# Patient Record
Sex: Female | Born: 1945
Health system: Southern US, Community
[De-identification: ages and names within clinical notes are randomized; demographics above are authoritative.]

## PROBLEM LIST (undated history)

## (undated) DIAGNOSIS — Z8619 Personal history of other infectious and parasitic diseases: Secondary | ICD-10-CM

## (undated) DIAGNOSIS — L309 Dermatitis, unspecified: Secondary | ICD-10-CM

## (undated) DIAGNOSIS — I10 Essential (primary) hypertension: Secondary | ICD-10-CM

## (undated) HISTORY — PX: UMBILICAL HERNIA REPAIR: SHX196

## (undated) HISTORY — DX: Essential (primary) hypertension: I10

## (undated) HISTORY — PX: BUNIONECTOMY: SHX129

---

## 1979-03-01 HISTORY — PX: UMBILICAL HERNIA REPAIR: SHX196

## 2004-09-21 ENCOUNTER — Encounter (INDEPENDENT_AMBULATORY_CARE_PROVIDER_SITE_OTHER): Payer: Self-pay | Admitting: Cardiology

## 2004-09-21 ENCOUNTER — Inpatient Hospital Stay (HOSPITAL_COMMUNITY): Admission: EM | Admit: 2004-09-21 | Discharge: 2004-09-22 | Payer: Self-pay | Admitting: Emergency Medicine

## 2008-03-03 ENCOUNTER — Ambulatory Visit: Payer: Self-pay | Admitting: Psychology

## 2008-03-03 ENCOUNTER — Encounter: Admission: RE | Admit: 2008-03-03 | Discharge: 2008-03-10 | Payer: Self-pay | Admitting: Psychology

## 2009-08-06 ENCOUNTER — Ambulatory Visit: Payer: Self-pay | Admitting: Diagnostic Radiology

## 2009-08-06 ENCOUNTER — Emergency Department (HOSPITAL_BASED_OUTPATIENT_CLINIC_OR_DEPARTMENT_OTHER): Admission: EM | Admit: 2009-08-06 | Discharge: 2009-08-06 | Payer: Self-pay | Admitting: Emergency Medicine

## 2010-05-17 LAB — DIFFERENTIAL
Basophils Absolute: 0 10*3/uL (ref 0.0–0.1)
Basophils Relative: 1 % (ref 0–1)
Eosinophils Absolute: 0.1 10*3/uL (ref 0.0–0.7)
Lymphs Abs: 2.4 10*3/uL (ref 0.7–4.0)
Monocytes Absolute: 0.6 10*3/uL (ref 0.1–1.0)
Monocytes Relative: 9 % (ref 3–12)
Neutro Abs: 3 10*3/uL (ref 1.7–7.7)
Neutrophils Relative %: 49 % (ref 43–77)

## 2010-05-17 LAB — POCT CARDIAC MARKERS
CKMB, poc: <1 ng/mL — ABNORMAL LOW (ref 1.0–8.0)
Troponin i, poc: <0.05 ng/mL (ref 0.00–0.09)

## 2010-05-17 LAB — CBC
MCHC: 34 g/dL (ref 30.0–36.0)
Platelets: 173 10*3/uL (ref 150–400)

## 2010-05-17 LAB — COMPREHENSIVE METABOLIC PANEL
AST: 53 U/L — ABNORMAL HIGH (ref 0–37)
Alkaline Phosphatase: 99 U/L (ref 39–117)
BUN: 17 mg/dL (ref 6–23)
CO2: 29 mEq/L (ref 19–32)
Creatinine, Ser: 0.9 mg/dL (ref 0.4–1.2)
GFR calc Af Amer: >60 mL/min (ref >60)
Glucose, Bld: 140 mg/dL — ABNORMAL HIGH (ref 70–99)
Total Bilirubin: 1.6 mg/dL — ABNORMAL HIGH (ref 0.3–1.2)

## 2010-07-16 NOTE — Discharge Summary (Signed)
Virginia Delgado, Virginia Delgado NO.:  [id number]   MEDICAL RECORD NO.:  [id number]          PATIENT TYPE:  INP   LOCATION:  4704                         FACILITY:  [facility]   PHYSICIAN:  Michaelyn Barter, M.D. DATE OF BIRTH:  05/25/1945   DATE OF ADMISSION:  09/21/2004  DATE OF DISCHARGE:  09/22/2004                                 DISCHARGE SUMMARY   PRIMARY CARE PHYSICIAN:  Unassigned.   HISTORY OF PRESENT ILLNESS:  Ms. Spease is a 65 year old female with a past  medical history of hypertension who arrived in the emergency room with the  chief complaint of chest pain.  She stated that the pain had started  intermittently over the past few months.  It was usually transient,  occurring at rest primarily, but occasionally with activity.  It was right-  sided in location and occasionally accompanied by nausea.  There was no  radiation, no diaphoresis, no shortness of breath or palpitations.  The  previous night, the chest pain occurred and was rated at a 10/10.   PAST MEDICAL HISTORY:  1.  Hypertension.  2.  Cardiolite stress test in December 2004 reported results negative.  3.  Hepatitis C diagnosed in 1995.  4.  History of placenta previa.   PAST SURGICAL HISTORY:  1.  Cesarean section x2.  2.  Repair of umbilical hernia.   SOCIAL HISTORY:  Alcohol, the patient denies.  Cigarettes, the patient  denies.   FAMILY HISTORY:  Father died of a heart attack at age 59.  Mother is living.  She has breast cancer.  Sister has history of breast cancer.   HOSPITAL COURSE:  Problem 1.  Chest pain.  The patient was admitted for  further evaluation of her chest pain.  While in the ER, she underwent a  chest x-ray which was interpreted as no acute cardiopulmonary process.  Over  the course of her hospitalization, she had cardiac enzymes completed, the  results of which for the troponin I were 0.01 x3.  Likewise, the CK MB's  were also within normal limits.  In addition, the patient  had a homocysteine  completed which was completely normal at 8.0; however, she had a D-dimer  completed and it was only marginally elevated at 0.49.  The patient did not  demonstrate any overt symptoms of a pulmonary embolus; however, venous  Dopplers of the lower extremities were completed to rule out the presence of  a DVT.  The final impression of which were no evidence of DVT, superficial  thrombosis or Baker's cyst noted bilaterally.  Following her admission into  the hospital, the patient did not complain of any chest pain.  Later in the  evening, on the date of her admission, she was noted to have told the nurse  that she was going home the following morning.  Today, the date of  discharge, the patient's condition is improved.   Problem 2.  Hypertension.  The patient's blood pressure has been controlled  over the course of her hospitalization.  She was restarted on two of her  prior home  medications, Norvasc 5 mg daily and Toprol XL 25 mg p.o. b.i.d.  The patient's blood pressure was controlled with both of these medications  and therefore her hydrochlorothiazide was not implemented over the course of  this hospitalization.   Problem 3.  Hypokalemia.  When the patient initially arrived in the  hospital, her potassium was noted to have been 3.3.  Therefore, the patient  was provided with potassium supplement and because her blood pressure again  was controlled, her hydrochlorothiazide was withheld.   CONDITION ON DISCHARGE:  Improved.  Today, the patient states that she does  not have any chest pain and she states she has not had any shortness of  breath.  She is very excited to be going home.  Her vitals today:  Temperature is 98.2, heart rate 66, respirations 20, blood pressure 112/64.  O2 saturations 97% on room air.   The patient did have an EKG completed this morning and it shows normal sinus  rhythm.  There are no obvious ST wave abnormalities.  Therefore, the  decision  has been made to discharge the patient home.   The patient will be discharged home on the following medications:  1.  Norvasc 5 mg p.o. daily.  2.  Toprol XL 25 mg p.o. b.i.d.   She is instructed to see her doctor before restarting her  hydrochlorothiazide given the fact that her blood pressure is currently well  controlled without it.  In addition, she is instructed to find a primary  care physician within the next three to four weeks for a routine follow-up  examination.       OR/MEDQ  D:  09/22/2004  T:  09/22/2004  Job:  [id number]

## 2010-07-16 NOTE — H&P (Signed)
NAMEYULANDA, Virginia Delgado NO.:  [id number]   MEDICAL RECORD NO.:  [id number]          PATIENT TYPE:  INP   LOCATION:                               FACILITY:  [facility]   PHYSICIAN:  Elliot Cousin, M.D.    DATE OF BIRTH:  1945-07-27   DATE OF ADMISSION:  09/21/2004  DATE OF DISCHARGE:                                HISTORY & PHYSICAL   PRIMARY CARE PHYSICIAN:  The patient is unassigned.   CHIEF COMPLAINT:  Chest pain.   HISTORY OF PRESENT ILLNESS:  The patient is a 65 year old lady with a past  medical history significant for hypertension, a negative cardiac stress test  in December of 2004, and hepatitis C, who presents to the emergency  department this morning with the chief complaint of chest pain. The patient  states that she has been having chest pain intermittently over the past few  months. The chest pain is usually transient and it occurs at rest mostly and  sometimes with activity. The pain is located primarily on the right side of  the chest. It is sometimes associated with nausea. No associated radiation,  diaphoresis, shortness of breath, pleurisy, or palpitations. The chest pain  generally is rated a 5/10 in intensity and generally lasts anywhere from 1  to 5 minutes. However, over the past 24 hours, the intensity of the pain  increased. Specifically, last night at approximately 6:00 to 8:00 p.m., the  chest pain was rated as a 10/10. The patient drove herself to [facility] (the patient recently moved from [place] and was unaware that  it was a OB-GYN hospital primarily). When she arrived to [facility],  she was given two sublingual nitroglycerin which took her pain from a 10/10  to 3/10 and subsequently to a 0/10. The patient was transferred to [facility] ER for further evaluation. The patient currently has no chest  pain. It is important to note that the patient had a negative Cardiolite  stress test in December of 2004 in  [place], [place]. Her primary care  physician at the time was Dr. Larina Bras. The stress test was performed primarily  as a risk stratification. At the time, the patient had no chest pain. The  patient does have a positive family history of heart disease; her father  died of a massive heart attack at 63 years of age. The patient denies any  recent stress or anxiety. She denies any recent lifting or manual labor. She  denies any upper respiratory infection symptoms. She has had no recent  cough.   When the patient was evaluated in the emergency department, her EKG revealed  sinus bradycardia with a heart rate of 59 beats per minute. She was  hemodynamically stable. Her cardiac markers x3 were completely negative. Her  chest x-ray revealed no acute disease. However, given the patient's risk  factors, she will be admitted for further evaluation and management.   PAST MEDICAL HISTORY:  1.  Hypertension.  2.  Cardiolite stress test in December of 2004, the results were reported as  negative.  3.  Hepatitis C diagnosed in 1995, etiology was felt to be a blood      transfusion in 1979 during one of her pregnancies.  4.  Status post Cesarean section x2.  5.  History of placenta previa with one pregnancy in 1979. During the      pregnancy, the patient received multiple blood transfusions.  6.  Status post umbilical hernia repair in the past.   MEDICATIONS:  1.  Norvasc 5 mg daily.  2.  Toprol-XL 25 mg b.i.d.  3.  Hydrochlorothiazide 25 mg daily.   ALLERGIES:  No known drug allergies.   SOCIAL HISTORY:  The patient recently moved from [place], [place], three  months ago. She has not found a primary care physician yet. She [place]  [place] She is married. She has two children.  She is employed in Presenter, broadcasting at [employer]. She does not smoke.  She drinks alcohol only occasionally. She denies illicit drug use.   FAMILY HISTORY:  Her father  died of a heart attack at 63 years of age. Her  mother is living. She has breast cancer and is 65 years of age. She also has  a sister who was diagnosed with breast cancer at 63 years of age.   REVIEW OF SYSTEMS:  The patient's review of systems is positive for  occasional ringing in her ears. Otherwise, her review of systems is  negative.   PHYSICAL EXAMINATION:  VITAL SIGNS:  Temperature 98.1, blood pressure  126/83, pulse 60, respiratory rate 16, oxygen saturation 100% on room air.  GENERAL:  The patient is a pleasant, mildly overweight, 65 year old African-  American woman who is currently sitting up in bed in no acute distress.  HEENT:  Head is normocephalic, nontraumatic. Pupils are equal, round, and  reactive to light. Extraocular movements are intact. Conjunctivae are clear.  Sclerae are white. Tympanic membranes are clear bilaterally. Nasal mucosa is  moist. No sinus drainage. Oropharynx:  Mucous membranes are moist; no  posterior exudates or erythema. Teeth are in good repair.  NECK:  Supple. No adenopathy, no thyromegaly, no bruit, no JVD.  LUNGS:  Clear to auscultation bilaterally.  HEART:  S1 and S2 with a soft systolic murmur and mild bradycardia.  ABDOMEN:  Mildly obese, positive bowel sounds, soft, nontender,  nondistended. No hepatosplenomegaly. The patient does have a well healed  suprapubic abdominal scar.  EXTREMITIES:  Pedal pulses 2+ bilaterally. No pretibial edema. No pedal  edema. The patient has good range of motion of all of her extremities. No  acute joint abnormalities.  NEUROLOGICAL:  The patient is alert and oriented x3. Cranial nerves II-XII  are intact. Strength is 5/5 throughout. Sensation is intact.   ADMISSION LABORATORY DATA:  EKG reveals sinus bradycardia with a heart rate  of 59 beats per minute. No other abnormalities. Chest x-ray reveals no acute  cardiopulmonary process. Cardiac markers negative x3 including myoglobin 51.9, CK-MB less than 1.0,  troponin I less than 0.05. WBC 7.3, hemoglobin  12.6, hematocrit 37.3, MCV 88.5, platelets 217. PTT 28, D-dimer 0.049 (0 to  0.48 within normal limits). PT 12.6, INR 0.9. Sodium 141, potassium 3.3,  chloride 110, CO2 24, glucose 106, BUN 10, creatinine 0.8, calcium 9.0.  Total protein 6.8, albumin 3.6, AST 43, ALT 53. Magnesium 2.2. Urinalysis:  Urine leukocyte esterase small, otherwise urinalysis is negative.   ASSESSMENT:  1.  Chest pain. The patient certainly has risk factors for coronary artery  disease including family history and hypertension. The patient states      that her cholesterol has been normal in the past. The chest pain appears      to be atypical given its right sided nature. Will also consider      gastroesophageal reflux disease, chest wall pain, less likely pulmonary      embolism.  2.  Bradycardia. Bradycardia is sinus bradycardia and is probably secondary      to Toprol-XL.  3.  Hypokalemia. The patient's potassium is 3.3. Her magnesium level is      within normal limits. The hypokalemia is probably secondary to      hydrochlorothiazide.  4.  Mildly elevated liver transaminases. The patient's SGOT is 43 and SGPT      is 53 on admission. The patient has known hepatitis C, apparently      acquired from blood transfusions in 1979. The patient has not received      Interferon treatment. She states that her primary care physician in      [place], [place], has been following her liver function tests, and      they have been more or less stable over the past few years.   PLAN:  1.  The patient will be admitted for further evaluation and management.  2.  Cardiac enzymes q.8h. x3 will be collected to rule out myocardial      infarction. Will check another EKG in the a.m.  3.  Will treat the patient's pain with nitro paste 1/4 inch every 6 hours      and p.r.n. sublingual nitroglycerin. Will also treat the patient's pain      with as needed morphine.  4.  Will  start prophylactic Lovenox rather than full dose Lovenox for now.      If the patient's cardiac enzymes are positive, the Lovenox will be      increased to be 1 mg/kg q.12h.  5.  Continue beta blocker therapy as well as Norvasc. Will hold      hydrochlorothiazide for now. Her blood pressures are currently within      normal limits.  6.  Will assess further with a 2-D echocardiogram. Will also check bilateral      lower extremity venous Dopplers to rule out DVT. The patient's D-dimer      is only marginally elevated.  7.  Will replete potassium chloride in the IV fluids and orally.  8.  Will also assess the patient's TSH, fasting lipid panel, and      homocysteine.       DF/MEDQ  D:  09/21/2004  T:  09/21/2004  Job:  [id number]

## 2011-07-19 ENCOUNTER — Encounter: Payer: Self-pay | Admitting: Obstetrics and Gynecology

## 2011-07-19 ENCOUNTER — Ambulatory Visit (INDEPENDENT_AMBULATORY_CARE_PROVIDER_SITE_OTHER): Payer: 59 | Admitting: Obstetrics and Gynecology

## 2011-07-19 VITALS — BP 128/66 | Ht 60.25 in | Wt 131.0 lb

## 2011-07-19 DIAGNOSIS — Z780 Asymptomatic menopausal state: Secondary | ICD-10-CM | POA: Insufficient documentation

## 2011-07-19 DIAGNOSIS — I10 Essential (primary) hypertension: Secondary | ICD-10-CM | POA: Insufficient documentation

## 2011-07-19 DIAGNOSIS — N951 Menopausal and female climacteric states: Secondary | ICD-10-CM

## 2011-07-19 DIAGNOSIS — Z124 Encounter for screening for malignant neoplasm of cervix: Secondary | ICD-10-CM

## 2011-07-19 DIAGNOSIS — B1920 Unspecified viral hepatitis C without hepatic coma: Secondary | ICD-10-CM | POA: Insufficient documentation

## 2011-07-19 NOTE — Progress Notes (Signed)
The patient is not taking hormone replacement therapy The patient  is taking a Calcium supplement. Post-menopausal bleeding:no  Last Pap: was normal May  2012 Last mammogram: was normal 2012 Last DEXA scan : Normal    Within 5 years Last colonoscopy:was normal   Urinary symptoms: none Normal bowel movements: Yes Reports abuse at home: No:  Hx chronic Hepatitis, nl LFTs now.  Followed by Dr. Allyne Gee  Subjective:    Virginia Delgado is a 66 y.o. female [phone] who presents for annual exam.  The patient has no complaints today.   The following portions of the patient's history were reviewed and updated as appropriate: allergies, current medications, past family history, past medical history, past social history, past surgical history and problem list.  Review of Systems Pertinent items are noted in HPI. Gastrointestinal:No change in bowel habits, no abdominal pain, no rectal bleeding Genitourinary:negative for dysuria, frequency, hematuria, nocturia and urinary incontinence    Objective:     BP 128/66  Ht 5' 0.25" (1.53 m)  Wt 131 lb (59.421 kg)  BMI 25.37 kg/m2  Weight:  Wt Readings from Last 1 Encounters:  07/19/11 131 lb (59.421 kg)     BMI: Body mass index is 25.37 kg/(m^2). General Appearance: Alert, appropriate appearance for age. No acute distress HEENT: Grossly normal Neck / Thyroid: Supple, no masses, nodes or enlargement Lungs: clear to auscultation bilaterally Back: No CVA tenderness Breast Exam: No masses or nodes.No dimpling, nipple retraction or discharge. Cardiovascular: Regular rate and rhythm. S1, S2, no murmur Gastrointestinal: Soft, non-tender, no masses or organomegaly Pelvic Exam: External genitalia: normal general appearance Vaginal: atrophic mucosa Cervix: normal appearance Adnexa: non palpable Uterus: normal single, nontender Rectovaginal: normal rectal, no masses Lymphatic Exam: Non-palpable nodes in neck, clavicular, axillary, or inguinal regions  Skin: no rash or abnormalities Neurologic: Normal gait and speech, no tremor  Psychiatric: Alert and oriented, appropriate affect.    Urinalysis:Not done      Assessment:    Normal gyn exam Menopause no symptoms history hepatitis C in remission    Plan:    Pap smear.with HR HPV   Follow-up:  for annual exam

## 2011-07-25 LAB — PAP IG AND HPV HIGH-RISK: HPV DNA High Risk: NOT DETECTED

## 2013-03-11 DIAGNOSIS — I129 Hypertensive chronic kidney disease with stage 1 through stage 4 chronic kidney disease, or unspecified chronic kidney disease: Secondary | ICD-10-CM | POA: Diagnosis not present

## 2013-03-11 DIAGNOSIS — N182 Chronic kidney disease, stage 2 (mild): Secondary | ICD-10-CM | POA: Diagnosis not present

## 2013-03-11 DIAGNOSIS — Z79899 Other long term (current) drug therapy: Secondary | ICD-10-CM | POA: Diagnosis not present

## 2013-05-07 DIAGNOSIS — M9981 Other biomechanical lesions of cervical region: Secondary | ICD-10-CM | POA: Diagnosis not present

## 2013-05-07 DIAGNOSIS — S239XXA Sprain of unspecified parts of thorax, initial encounter: Secondary | ICD-10-CM | POA: Diagnosis not present

## 2013-05-07 DIAGNOSIS — M47812 Spondylosis without myelopathy or radiculopathy, cervical region: Secondary | ICD-10-CM | POA: Diagnosis not present

## 2013-05-07 DIAGNOSIS — M999 Biomechanical lesion, unspecified: Secondary | ICD-10-CM | POA: Diagnosis not present

## 2013-05-15 DIAGNOSIS — M9981 Other biomechanical lesions of cervical region: Secondary | ICD-10-CM | POA: Diagnosis not present

## 2013-05-15 DIAGNOSIS — S239XXA Sprain of unspecified parts of thorax, initial encounter: Secondary | ICD-10-CM | POA: Diagnosis not present

## 2013-05-15 DIAGNOSIS — M999 Biomechanical lesion, unspecified: Secondary | ICD-10-CM | POA: Diagnosis not present

## 2013-05-15 DIAGNOSIS — M47812 Spondylosis without myelopathy or radiculopathy, cervical region: Secondary | ICD-10-CM | POA: Diagnosis not present

## 2013-05-30 DIAGNOSIS — M9981 Other biomechanical lesions of cervical region: Secondary | ICD-10-CM | POA: Diagnosis not present

## 2013-05-30 DIAGNOSIS — M47812 Spondylosis without myelopathy or radiculopathy, cervical region: Secondary | ICD-10-CM | POA: Diagnosis not present

## 2013-05-30 DIAGNOSIS — S239XXA Sprain of unspecified parts of thorax, initial encounter: Secondary | ICD-10-CM | POA: Diagnosis not present

## 2013-05-30 DIAGNOSIS — M999 Biomechanical lesion, unspecified: Secondary | ICD-10-CM | POA: Diagnosis not present

## 2013-06-27 DIAGNOSIS — M47812 Spondylosis without myelopathy or radiculopathy, cervical region: Secondary | ICD-10-CM | POA: Diagnosis not present

## 2013-06-27 DIAGNOSIS — S239XXA Sprain of unspecified parts of thorax, initial encounter: Secondary | ICD-10-CM | POA: Diagnosis not present

## 2013-06-27 DIAGNOSIS — M9981 Other biomechanical lesions of cervical region: Secondary | ICD-10-CM | POA: Diagnosis not present

## 2013-06-27 DIAGNOSIS — M999 Biomechanical lesion, unspecified: Secondary | ICD-10-CM | POA: Diagnosis not present

## 2013-07-02 DIAGNOSIS — B182 Chronic viral hepatitis C: Secondary | ICD-10-CM | POA: Diagnosis not present

## 2013-07-04 DIAGNOSIS — Z79899 Other long term (current) drug therapy: Secondary | ICD-10-CM | POA: Diagnosis not present

## 2013-07-04 DIAGNOSIS — N182 Chronic kidney disease, stage 2 (mild): Secondary | ICD-10-CM | POA: Diagnosis not present

## 2013-07-04 DIAGNOSIS — B182 Chronic viral hepatitis C: Secondary | ICD-10-CM | POA: Diagnosis not present

## 2013-07-04 DIAGNOSIS — I129 Hypertensive chronic kidney disease with stage 1 through stage 4 chronic kidney disease, or unspecified chronic kidney disease: Secondary | ICD-10-CM | POA: Diagnosis not present

## 2013-07-25 DIAGNOSIS — M47812 Spondylosis without myelopathy or radiculopathy, cervical region: Secondary | ICD-10-CM | POA: Diagnosis not present

## 2013-07-25 DIAGNOSIS — S239XXA Sprain of unspecified parts of thorax, initial encounter: Secondary | ICD-10-CM | POA: Diagnosis not present

## 2013-07-25 DIAGNOSIS — M9981 Other biomechanical lesions of cervical region: Secondary | ICD-10-CM | POA: Diagnosis not present

## 2013-07-25 DIAGNOSIS — M999 Biomechanical lesion, unspecified: Secondary | ICD-10-CM | POA: Diagnosis not present

## 2013-08-01 DIAGNOSIS — B182 Chronic viral hepatitis C: Secondary | ICD-10-CM | POA: Diagnosis not present

## 2013-08-01 DIAGNOSIS — I129 Hypertensive chronic kidney disease with stage 1 through stage 4 chronic kidney disease, or unspecified chronic kidney disease: Secondary | ICD-10-CM | POA: Diagnosis not present

## 2013-08-01 DIAGNOSIS — N182 Chronic kidney disease, stage 2 (mild): Secondary | ICD-10-CM | POA: Diagnosis not present

## 2013-08-01 DIAGNOSIS — Z79899 Other long term (current) drug therapy: Secondary | ICD-10-CM | POA: Diagnosis not present

## 2013-10-04 DIAGNOSIS — B182 Chronic viral hepatitis C: Secondary | ICD-10-CM | POA: Diagnosis not present

## 2013-10-10 DIAGNOSIS — M810 Age-related osteoporosis without current pathological fracture: Secondary | ICD-10-CM | POA: Diagnosis not present

## 2013-10-10 DIAGNOSIS — N182 Chronic kidney disease, stage 2 (mild): Secondary | ICD-10-CM | POA: Diagnosis not present

## 2013-10-10 DIAGNOSIS — I129 Hypertensive chronic kidney disease with stage 1 through stage 4 chronic kidney disease, or unspecified chronic kidney disease: Secondary | ICD-10-CM | POA: Diagnosis not present

## 2013-10-10 DIAGNOSIS — B182 Chronic viral hepatitis C: Secondary | ICD-10-CM | POA: Diagnosis not present

## 2013-10-10 DIAGNOSIS — Z0000 Encounter for general adult medical examination without abnormal findings: Secondary | ICD-10-CM | POA: Diagnosis not present

## 2013-10-14 DIAGNOSIS — Z1231 Encounter for screening mammogram for malignant neoplasm of breast: Secondary | ICD-10-CM | POA: Diagnosis not present

## 2013-10-14 DIAGNOSIS — Z803 Family history of malignant neoplasm of breast: Secondary | ICD-10-CM | POA: Diagnosis not present

## 2013-11-07 DIAGNOSIS — M9981 Other biomechanical lesions of cervical region: Secondary | ICD-10-CM | POA: Diagnosis not present

## 2013-11-07 DIAGNOSIS — M47812 Spondylosis without myelopathy or radiculopathy, cervical region: Secondary | ICD-10-CM | POA: Diagnosis not present

## 2013-11-07 DIAGNOSIS — S239XXA Sprain of unspecified parts of thorax, initial encounter: Secondary | ICD-10-CM | POA: Diagnosis not present

## 2013-11-07 DIAGNOSIS — M999 Biomechanical lesion, unspecified: Secondary | ICD-10-CM | POA: Diagnosis not present

## 2013-11-26 DIAGNOSIS — Z124 Encounter for screening for malignant neoplasm of cervix: Secondary | ICD-10-CM | POA: Diagnosis not present

## 2013-11-26 DIAGNOSIS — Z01419 Encounter for gynecological examination (general) (routine) without abnormal findings: Secondary | ICD-10-CM | POA: Diagnosis not present

## 2013-11-26 DIAGNOSIS — R3915 Urgency of urination: Secondary | ICD-10-CM | POA: Diagnosis not present

## 2013-12-30 ENCOUNTER — Encounter: Payer: Self-pay | Admitting: Obstetrics and Gynecology

## 2014-01-08 DIAGNOSIS — N760 Acute vaginitis: Secondary | ICD-10-CM | POA: Diagnosis not present

## 2014-01-08 DIAGNOSIS — R350 Frequency of micturition: Secondary | ICD-10-CM | POA: Diagnosis not present

## 2014-01-08 DIAGNOSIS — N952 Postmenopausal atrophic vaginitis: Secondary | ICD-10-CM | POA: Diagnosis not present

## 2014-01-17 DIAGNOSIS — M9902 Segmental and somatic dysfunction of thoracic region: Secondary | ICD-10-CM | POA: Diagnosis not present

## 2014-01-17 DIAGNOSIS — M47812 Spondylosis without myelopathy or radiculopathy, cervical region: Secondary | ICD-10-CM | POA: Diagnosis not present

## 2014-01-17 DIAGNOSIS — M9901 Segmental and somatic dysfunction of cervical region: Secondary | ICD-10-CM | POA: Diagnosis not present

## 2014-01-17 DIAGNOSIS — S29012A Strain of muscle and tendon of back wall of thorax, initial encounter: Secondary | ICD-10-CM | POA: Diagnosis not present

## 2014-04-09 DIAGNOSIS — M9901 Segmental and somatic dysfunction of cervical region: Secondary | ICD-10-CM | POA: Diagnosis not present

## 2014-04-09 DIAGNOSIS — M9902 Segmental and somatic dysfunction of thoracic region: Secondary | ICD-10-CM | POA: Diagnosis not present

## 2014-04-09 DIAGNOSIS — S29012A Strain of muscle and tendon of back wall of thorax, initial encounter: Secondary | ICD-10-CM | POA: Diagnosis not present

## 2014-04-09 DIAGNOSIS — M47812 Spondylosis without myelopathy or radiculopathy, cervical region: Secondary | ICD-10-CM | POA: Diagnosis not present

## 2014-04-10 DIAGNOSIS — R8299 Other abnormal findings in urine: Secondary | ICD-10-CM | POA: Diagnosis not present

## 2014-04-10 DIAGNOSIS — R350 Frequency of micturition: Secondary | ICD-10-CM | POA: Diagnosis not present

## 2014-04-10 DIAGNOSIS — N952 Postmenopausal atrophic vaginitis: Secondary | ICD-10-CM | POA: Diagnosis not present

## 2014-04-16 DIAGNOSIS — M47812 Spondylosis without myelopathy or radiculopathy, cervical region: Secondary | ICD-10-CM | POA: Diagnosis not present

## 2014-04-16 DIAGNOSIS — M9901 Segmental and somatic dysfunction of cervical region: Secondary | ICD-10-CM | POA: Diagnosis not present

## 2014-04-16 DIAGNOSIS — S29012A Strain of muscle and tendon of back wall of thorax, initial encounter: Secondary | ICD-10-CM | POA: Diagnosis not present

## 2014-04-16 DIAGNOSIS — M9902 Segmental and somatic dysfunction of thoracic region: Secondary | ICD-10-CM | POA: Diagnosis not present

## 2014-04-30 DIAGNOSIS — S29012A Strain of muscle and tendon of back wall of thorax, initial encounter: Secondary | ICD-10-CM | POA: Diagnosis not present

## 2014-04-30 DIAGNOSIS — M9902 Segmental and somatic dysfunction of thoracic region: Secondary | ICD-10-CM | POA: Diagnosis not present

## 2014-04-30 DIAGNOSIS — M9901 Segmental and somatic dysfunction of cervical region: Secondary | ICD-10-CM | POA: Diagnosis not present

## 2014-04-30 DIAGNOSIS — M47812 Spondylosis without myelopathy or radiculopathy, cervical region: Secondary | ICD-10-CM | POA: Diagnosis not present

## 2014-11-27 DIAGNOSIS — N182 Chronic kidney disease, stage 2 (mild): Secondary | ICD-10-CM | POA: Diagnosis not present

## 2014-11-27 DIAGNOSIS — I129 Hypertensive chronic kidney disease with stage 1 through stage 4 chronic kidney disease, or unspecified chronic kidney disease: Secondary | ICD-10-CM | POA: Diagnosis not present

## 2015-05-15 DIAGNOSIS — Z8619 Personal history of other infectious and parasitic diseases: Secondary | ICD-10-CM | POA: Diagnosis not present

## 2015-05-15 DIAGNOSIS — Z6826 Body mass index (BMI) 26.0-26.9, adult: Secondary | ICD-10-CM | POA: Diagnosis not present

## 2015-05-15 DIAGNOSIS — Z01419 Encounter for gynecological examination (general) (routine) without abnormal findings: Secondary | ICD-10-CM | POA: Diagnosis not present

## 2015-05-15 DIAGNOSIS — N952 Postmenopausal atrophic vaginitis: Secondary | ICD-10-CM | POA: Diagnosis not present

## 2015-05-20 DIAGNOSIS — H02733 Vitiligo of right eye, unspecified eyelid and periocular area: Secondary | ICD-10-CM | POA: Diagnosis not present

## 2015-05-20 DIAGNOSIS — N182 Chronic kidney disease, stage 2 (mild): Secondary | ICD-10-CM | POA: Diagnosis not present

## 2015-05-20 DIAGNOSIS — R7309 Other abnormal glucose: Secondary | ICD-10-CM | POA: Diagnosis not present

## 2015-05-20 DIAGNOSIS — I129 Hypertensive chronic kidney disease with stage 1 through stage 4 chronic kidney disease, or unspecified chronic kidney disease: Secondary | ICD-10-CM | POA: Diagnosis not present

## 2015-06-04 DIAGNOSIS — L819 Disorder of pigmentation, unspecified: Secondary | ICD-10-CM | POA: Diagnosis not present

## 2015-06-04 DIAGNOSIS — L219 Seborrheic dermatitis, unspecified: Secondary | ICD-10-CM | POA: Diagnosis not present

## 2015-06-24 ENCOUNTER — Encounter (HOSPITAL_BASED_OUTPATIENT_CLINIC_OR_DEPARTMENT_OTHER): Payer: Self-pay | Admitting: *Deleted

## 2015-06-24 ENCOUNTER — Emergency Department (HOSPITAL_BASED_OUTPATIENT_CLINIC_OR_DEPARTMENT_OTHER): Payer: BLUE CROSS/BLUE SHIELD

## 2015-06-24 ENCOUNTER — Emergency Department (HOSPITAL_BASED_OUTPATIENT_CLINIC_OR_DEPARTMENT_OTHER)
Admission: EM | Admit: 2015-06-24 | Discharge: 2015-06-24 | Disposition: A | Discharge status: Home / Self Care | Payer: BLUE CROSS/BLUE SHIELD | Attending: Emergency Medicine | Admitting: Emergency Medicine

## 2015-06-24 DIAGNOSIS — I10 Essential (primary) hypertension: Secondary | ICD-10-CM | POA: Diagnosis not present

## 2015-06-24 DIAGNOSIS — R51 Headache: Secondary | ICD-10-CM | POA: Diagnosis not present

## 2015-06-24 DIAGNOSIS — R030 Elevated blood-pressure reading, without diagnosis of hypertension: Secondary | ICD-10-CM

## 2015-06-24 DIAGNOSIS — IMO0001 Reserved for inherently not codable concepts without codable children: Secondary | ICD-10-CM

## 2015-06-24 DIAGNOSIS — R519 Headache, unspecified: Secondary | ICD-10-CM

## 2015-06-24 LAB — URINALYSIS, ROUTINE W REFLEX MICROSCOPIC
BILIRUBIN URINE: NEGATIVE
GLUCOSE, UA: NEGATIVE mg/dL
Hgb urine dipstick: NEGATIVE
KETONES UR: NEGATIVE mg/dL
LEUKOCYTES UA: NEGATIVE
NITRITE: NEGATIVE
PROTEIN: NEGATIVE mg/dL
Specific Gravity, Urine: 1.005 (ref 1.005–1.030)
pH: 6.5 (ref 5.0–8.0)

## 2015-06-24 LAB — COMPREHENSIVE METABOLIC PANEL
ALT: 15 U/L (ref 14–54)
ANION GAP: 5 (ref 5–15)
AST: 26 U/L (ref 15–41)
Albumin: 4.5 g/dL (ref 3.5–5.0)
Alkaline Phosphatase: 62 U/L (ref 38–126)
BUN: 17 mg/dL (ref 6–20)
CALCIUM: 10.3 mg/dL (ref 8.9–10.3)
CHLORIDE: 104 mmol/L (ref 101–111)
CO2: 28 mmol/L (ref 22–32)
Creatinine, Ser: 0.85 mg/dL (ref 0.44–1.00)
Glucose, Bld: 98 mg/dL (ref 65–99)
Potassium: 3.4 mmol/L — ABNORMAL LOW (ref 3.5–5.1)
SODIUM: 137 mmol/L (ref 135–145)
Total Bilirubin: 1.7 mg/dL — ABNORMAL HIGH (ref 0.3–1.2)
Total Protein: 7.8 g/dL (ref 6.5–8.1)

## 2015-06-24 LAB — CBC WITH DIFFERENTIAL/PLATELET
Basophils Absolute: 0 10*3/uL (ref 0.0–0.1)
Basophils Relative: 0 %
EOS ABS: 0.2 10*3/uL (ref 0.0–0.7)
EOS PCT: 3 %
HCT: 41.4 % (ref 36.0–46.0)
Hemoglobin: 14.6 g/dL (ref 12.0–15.0)
LYMPHS ABS: 2 10*3/uL (ref 0.7–4.0)
Lymphocytes Relative: 34 %
MCH: 30.7 pg (ref 26.0–34.0)
MCHC: 35.3 g/dL (ref 30.0–36.0)
MCV: 87 fL (ref 78.0–100.0)
MONO ABS: 0.6 10*3/uL (ref 0.1–1.0)
MONOS PCT: 10 %
Neutro Abs: 3.2 10*3/uL (ref 1.7–7.7)
Neutrophils Relative %: 53 %
PLATELETS: 139 10*3/uL — AB (ref 150–400)
RBC: 4.76 MIL/uL (ref 3.87–5.11)
RDW: 12.8 % (ref 11.5–15.5)
WBC: 6 10*3/uL (ref 4.0–10.5)

## 2015-06-24 LAB — TROPONIN I

## 2015-06-24 LAB — PROTIME-INR
INR: 0.9 (ref 0.00–1.49)
PROTHROMBIN TIME: 12.4 s (ref 11.6–15.2)

## 2015-06-24 MED ORDER — POTASSIUM CHLORIDE CRYS ER 20 MEQ PO TBCR
40.0000 meq | EXTENDED_RELEASE_TABLET | Freq: Once | ORAL | Status: AC
  Administered 2015-06-24: 40 meq via ORAL
  Filled 2015-06-24: qty 2

## 2015-06-24 MED ORDER — LABETALOL HCL 5 MG/ML IV SOLN
20.0000 mg | Freq: Once | INTRAVENOUS | Status: AC
  Administered 2015-06-24: 20 mg via INTRAVENOUS
  Filled 2015-06-24: qty 4

## 2015-06-24 MED ORDER — LORAZEPAM 2 MG/ML IJ SOLN
1.0000 mg | Freq: Once | INTRAMUSCULAR | Status: DC
  Filled 2015-06-24: qty 1

## 2015-06-24 MED ORDER — HYDROCHLOROTHIAZIDE 25 MG PO TABS
25.0000 mg | ORAL_TABLET | Freq: Once | ORAL | Status: AC
  Administered 2015-06-24: 25 mg via ORAL
  Filled 2015-06-24: qty 1

## 2015-06-24 MED ORDER — AMLODIPINE BESYLATE 5 MG PO TABS
5.0000 mg | ORAL_TABLET | Freq: Once | ORAL | Status: AC
  Administered 2015-06-24: 5 mg via ORAL
  Filled 2015-06-24: qty 1

## 2015-06-24 MED ORDER — HYDROCODONE-ACETAMINOPHEN 5-325 MG PO TABS
2.0000 | ORAL_TABLET | Freq: Once | ORAL | Status: AC
  Administered 2015-06-24: 2 via ORAL
  Filled 2015-06-24: qty 2

## 2015-06-24 MED ORDER — SODIUM CHLORIDE 0.9 % IV SOLN: INTRAVENOUS | Status: DC

## 2015-06-24 NOTE — ED Notes (Signed)
Pt verbalizes understanding of d/c instructions and denies any further needs at this time. 

## 2015-06-24 NOTE — ED Provider Notes (Signed)
CSN: [id number]     Arrival date & time 06/24/15  1527 History   First MD Initiated Contact with Patient 06/24/15 1606     Chief Complaint  Patient presents with  . Hypertension     (Consider location/radiation/quality/duration/timing/severity/associated sxs/prior Treatment) HPI Comments: Patient is a 70 year old female who presents to the emergency department with a complaint of "my blood pressure is up, and I have a headache."  Patient states that her head has been feeling "funny" over the past 3 days. She has been having a mild headache that would come and go. She began checking her blood pressure, and found that it was elevated. Today she noticed a sharp pain in the left temporal area and the left side of her face felt "funny". She states that she just feel herself, and came to the emergency department for evaluation of this issue. She has not had any double vision to be reported. His been no loss of consciousness. There has been no loss of use of upper or lower extremities, his been no difficulty with speaking, or being understood. There's been no loss of control of bowels or bladder, and has been no loss of control of upper or lower extremities. The patient is not had any injury or trauma to be reported. There's been no recent changes in diet, activity, or medications. Nothing that she has tried seems to make this any better, and the patient states she has not missed any of her medication doses recently.  The history is provided by the patient.    Past Medical History  Diagnosis Date  . Hypertension   . Hepatitis C    Past Surgical History  Procedure Laterality Date  . Cesarean section    . Umbilical hernia repair     Family History  Problem Relation Age of Onset  . Cancer Mother     breast  . Heart disease Father   . Cancer Sister     sarcoma   Social History  Substance Use Topics  . Smoking status: Never Smoker   . Smokeless tobacco: Never Used  . Alcohol Use: Yes   Comment: social   OB History    Gravida Para Term Preterm AB TAB SAB Ectopic Multiple Living   4 2 2  2 2          Review of Systems  Neurological: Positive for headaches.  All other systems reviewed and are negative.     Allergies  Contrast media; Latex; and Lidocaine  Home Medications   Prior to Admission medications   Medication Sig Start Date End Date Taking? Authorizing Provider  hydrochlorothiazide (HYDRODIURIL) 25 MG tablet Take 25 mg by mouth daily.    Historical Provider, MD  metoprolol tartrate (LOPRESSOR) 25 MG tablet Take 25 mg by mouth 2 (two) times daily.    Historical Provider, MD   BP 205/98 mmHg  Pulse 84  Temp(Src) 98.4 F (36.9 C) (Oral)  Resp 12  Ht 5' 0.25" (1.53 m)  Wt 61.689 kg  BMI 26.35 kg/m2  SpO2 99% Physical Exam  Constitutional: She is oriented to person, place, and time. She appears well-developed and well-nourished.  Non-toxic appearance.  HENT:  Head: Normocephalic.  Right Ear: Tympanic membrane and external ear normal.  Left Ear: Tympanic membrane and external ear normal.  Speech is clear and understandable.  Eyes: EOM and lids are normal. Pupils are equal, round, and reactive to light.  Neck: Normal range of motion. Neck supple. Carotid bruit is not present.  No carotid bruits appreciated.  Cardiovascular: Normal rate, regular rhythm, normal heart sounds, intact distal pulses and normal pulses.   Pulmonary/Chest: Breath sounds normal. No respiratory distress.  Abdominal: Soft. Bowel sounds are normal. There is no tenderness. There is no guarding.  Musculoskeletal: Normal range of motion.  Lymphadenopathy:       Head (right side): No submandibular adenopathy present.       Head (left side): No submandibular adenopathy present.    She has no cervical adenopathy.  Neurological: She is alert and oriented to person, place, and time. She has normal strength. No cranial nerve deficit or sensory deficit. She exhibits normal muscle tone.  Coordination normal.  There is no facial weakness appreciated. The pupils are equal and reactive to light, the extra axial movements are intact. The patient is able to raise her soft palate without any problem. The speech is clear and understandable.  The motor strength of the upper and lower extremities is symmetrical. The coordination is intact. The sensory test are symmetrical on the right and the left of the upper and lower extremities. The patient is ambulatory without problem.  Skin: Skin is warm and dry.  Psychiatric: She has a normal mood and affect. Her speech is normal.  Nursing note and vitals reviewed.   ED Course  Pt seen with me by Dr Ashok Cordia.  Procedures (including critical care time) Labs Review Labs Reviewed  CBC WITH DIFFERENTIAL/PLATELET - Abnormal; Notable for the following:    Platelets 139 (*)    All other components within normal limits  COMPREHENSIVE METABOLIC PANEL  PROTIME-INR  TROPONIN I    Imaging Review No results found. I have personally reviewed and evaluated these images and lab results as part of my medical decision-making.   EKG Interpretation   Date/Time:  Wednesday June 24 2015 16:01:56 EDT Ventricular Rate:  80 PR Interval:  140 QRS Duration: 74 QT Interval:  377 QTC Calculation: 435 R Axis:   35 Text Interpretation:  Sinus rhythm Probable left ventricular hypertrophy  No significant change since last tracing Confirmed by Ashok Cordia  MD, Lennette Bihari  ([id number]) on 06/24/2015 4:08:02 PM      MDM Vital signs reviewed. The electrocardiogram shows a probable left ventricular hypertrophy, patient is in a normal sinus rhythm. There no major changes from previous examination. No acute event identified. A CT scan of the head is negative for acute intracranial abnormality.  The patient continues to have blood pressure elevations and pain. The patient was treated with oral hydrochlorothiazide, and Norco.  Urinalysis is negative for acute problem. Complete  blood count is negative for signs of major infection, or acute anemia. Competence of metabolic panel shows potassium to be slightly low at 3.4. The patient was given oral potassium, and able to tolerate that without problem. The remainder of the competence of metabolic panel is within normal limits, with exception of the total bili being slightly elevated at 1.7. The anion gap is 5 and within normal limits. A troponin was obtained and shows no acute event, as it is less than 0.03.  Repeat neurologic evaluation is negative for any new findings.   The blood pressure continues to be elevated.    1833 - Blood pressure remains elevated at 200/118 from 205/103 at the previous reading. Labetalol and ativan ordered by Dr Ashok Cordia.  The patient began to feel tremendously better after the labetalol. The Ativan was not given.  Recheck of neurologic examination shows no acute findings. The patient is conversing with her  daughter in the room without any problem whatsoever. The patient is ambulatory without problem. The blood pressure has significantly improved to 147/74 at the time of discharge.  The patient will be discharged home. She will be followed closely on an outpatient basis by Dr. Baird Cancer. The patient is advised to return to the emergency department immediately if any changes, problems, or concerns. She will continue her current medications until seen by Dr. Baird Cancer.    Final diagnoses:  Elevated blood pressure  Headache, unspecified headache type    **I have reviewed nursing notes, vital signs, and all appropriate lab and imaging results for this patient.Lily Kocher, PA-C 06/24/15 2024  Lajean Saver, MD 06/25/15 0000

## 2015-06-24 NOTE — ED Notes (Signed)
Pt c/o increased bp with h/a x 3 days

## 2015-06-24 NOTE — Discharge Instructions (Signed)
Your labs are negative for acute problem. Your CT scan is negative for acute intracranial abnormality. Please continue your current medication. Please see Dr. Glendale Chard tomorrow if possible for follow-up and recheck of your pressure and your headache. Please return to the emergency department immediately if any changes, problems, or concerns. Hypertension Hypertension is another name for high blood pressure. High blood pressure forces your heart to work harder to pump blood. A blood pressure reading has two numbers, which includes a higher number over a lower number (example: 110/72). HOME CARE   Have your blood pressure rechecked by your doctor.  Only take medicine as told by your doctor. Follow the directions carefully. The medicine does not work as well if you skip doses. Skipping doses also puts you at risk for problems.  Do not smoke.  Monitor your blood pressure at home as told by your doctor. GET HELP IF:  You think you are having a reaction to the medicine you are taking.  You have repeat headaches or feel dizzy.  You have puffiness (swelling) in your ankles.  You have trouble with your vision. GET HELP RIGHT AWAY IF:   You get a very bad headache and are confused.  You feel weak, numb, or faint.  You get chest or belly (abdominal) pain.  You throw up (vomit).  You cannot breathe very well. MAKE SURE YOU:   Understand these instructions.  Will watch your condition.  Will get help right away if you are not doing well or get worse.   This information is not intended to replace advice given to you by your health care provider. Make sure you discuss any questions you have with your health care provider.   Document Released: 08/03/2007 Document Revised: 02/19/2013 Document Reviewed: 12/07/2012 Elsevier Interactive Patient Education [employer].

## 2015-06-24 NOTE — ED Notes (Signed)
Patient transported to CT 

## 2015-06-25 ENCOUNTER — Emergency Department (HOSPITAL_COMMUNITY)
Admission: EM | Admit: 2015-06-25 | Discharge: 2015-06-25 | Disposition: A | Discharge status: Home / Self Care | Payer: BLUE CROSS/BLUE SHIELD | Attending: Emergency Medicine | Admitting: Emergency Medicine

## 2015-06-25 ENCOUNTER — Encounter (HOSPITAL_COMMUNITY): Payer: Self-pay | Admitting: Emergency Medicine

## 2015-06-25 DIAGNOSIS — Z79899 Other long term (current) drug therapy: Secondary | ICD-10-CM | POA: Insufficient documentation

## 2015-06-25 DIAGNOSIS — Z91040 Latex allergy status: Secondary | ICD-10-CM | POA: Diagnosis not present

## 2015-06-25 DIAGNOSIS — Z872 Personal history of diseases of the skin and subcutaneous tissue: Secondary | ICD-10-CM | POA: Insufficient documentation

## 2015-06-25 DIAGNOSIS — I10 Essential (primary) hypertension: Secondary | ICD-10-CM | POA: Insufficient documentation

## 2015-06-25 DIAGNOSIS — R51 Headache: Secondary | ICD-10-CM | POA: Diagnosis not present

## 2015-06-25 DIAGNOSIS — Z8619 Personal history of other infectious and parasitic diseases: Secondary | ICD-10-CM | POA: Diagnosis not present

## 2015-06-25 HISTORY — DX: Dermatitis, unspecified: L30.9

## 2015-06-25 HISTORY — DX: Personal history of other infectious and parasitic diseases: Z86.19

## 2015-06-25 LAB — URINALYSIS, ROUTINE W REFLEX MICROSCOPIC
Bilirubin Urine: NEGATIVE
GLUCOSE, UA: NEGATIVE mg/dL
KETONES UR: NEGATIVE mg/dL
Nitrite: NEGATIVE
PROTEIN: NEGATIVE mg/dL
Specific Gravity, Urine: 1.009 (ref 1.005–1.030)
pH: 5.5 (ref 5.0–8.0)

## 2015-06-25 LAB — BASIC METABOLIC PANEL
ANION GAP: 8 (ref 5–15)
BUN: 12 mg/dL (ref 6–20)
CALCIUM: 10.9 mg/dL — AB (ref 8.9–10.3)
CO2: 30 mmol/L (ref 22–32)
Chloride: 101 mmol/L (ref 101–111)
Creatinine, Ser: 1.1 mg/dL — ABNORMAL HIGH (ref 0.44–1.00)
GFR calc Af Amer: 59 mL/min — ABNORMAL LOW (ref >60)
GFR, EST NON AFRICAN AMERICAN: 51 mL/min — AB (ref >60)
GLUCOSE: 101 mg/dL — AB (ref 65–99)
POTASSIUM: 3.8 mmol/L (ref 3.5–5.1)
SODIUM: 139 mmol/L (ref 135–145)

## 2015-06-25 LAB — CBC
HCT: 40 % (ref 36.0–46.0)
Hemoglobin: 13.6 g/dL (ref 12.0–15.0)
MCH: 29.8 pg (ref 26.0–34.0)
MCHC: 34 g/dL (ref 30.0–36.0)
MCV: 87.7 fL (ref 78.0–100.0)
PLATELETS: 216 10*3/uL (ref 150–400)
RBC: 4.56 MIL/uL (ref 3.87–5.11)
RDW: 13.2 % (ref 11.5–15.5)
WBC: 6.7 10*3/uL (ref 4.0–10.5)

## 2015-06-25 LAB — URINE MICROSCOPIC-ADD ON

## 2015-06-25 MED ORDER — HYDROCODONE-ACETAMINOPHEN 5-325 MG PO TABS: 1.0000 | ORAL_TABLET | Freq: Four times a day (QID) | ORAL | Status: DC | PRN

## 2015-06-25 MED ORDER — LABETALOL HCL 5 MG/ML IV SOLN
20.0000 mg | INTRAVENOUS | Status: DC | PRN
  Administered 2015-06-25 (×2): 20 mg via INTRAVENOUS
  Filled 2015-06-25 (×3): qty 4

## 2015-06-25 MED ORDER — HYDROCODONE-ACETAMINOPHEN 5-325 MG PO TABS
1.0000 | ORAL_TABLET | Freq: Once | ORAL | Status: AC
  Administered 2015-06-25: 1 via ORAL
  Filled 2015-06-25: qty 1

## 2015-06-25 MED ORDER — MORPHINE SULFATE (PF) 4 MG/ML IV SOLN
4.0000 mg | Freq: Once | INTRAVENOUS | Status: DC
Start: 2015-06-25 — End: 2015-06-25
  Filled 2015-06-25: qty 1

## 2015-06-25 MED ORDER — ACETAMINOPHEN 500 MG PO TABS: 1000.0000 mg | ORAL_TABLET | Freq: Once | ORAL | Status: DC

## 2015-06-25 MED ORDER — HYDROCODONE-ACETAMINOPHEN 5-325 MG PO TABS: 2.0000 | ORAL_TABLET | Freq: Once | ORAL | Status: DC

## 2015-06-25 NOTE — ED Notes (Signed)
Pt decided to take vicodin here in the hospital rather than filling her Rx on the way home she states.

## 2015-06-25 NOTE — ED Provider Notes (Signed)
CSN: [id number]     Arrival date & time 06/25/15  1815 History   First MD Initiated Contact with Patient 06/25/15 2005     Chief Complaint  Patient presents with  . Hypertension     (Consider location/radiation/quality/duration/timing/severity/associated sxs/prior Treatment) HPI Comments: Patient with past medical history of hypertension, presents emergency department with chief complaint of headache and hypertension. She states that she has been having headache for the past 4 days. She reports that her blood pressure has been running in the low 200s over 110s. She denies any numbness, weakness, or tingling. Denies any chest pain or shortness breath. She states that she has had some slight blurred vision. She was seen yesterday at [facility] and had a CT scan of her head, which was negative, and was given amlodipine and Ativan. Her pressures improved, and she was discharged home. She was instructed to return if her symptoms worsen. She states that her pressures have been running high all day. She is very concerned that she is going to have a stroke or heart attack.  The history is provided by the patient. No language interpreter was used.    Past Medical History  Diagnosis Date  . Hypertension   . Hx of hepatitis C   . Dermatitis    Past Surgical History  Procedure Laterality Date  . Cesarean section    . Umbilical hernia repair     Family History  Problem Relation Age of Onset  . Cancer Mother     breast  . Heart disease Father   . Cancer Sister     sarcoma   Social History  Substance Use Topics  . Smoking status: Never Smoker   . Smokeless tobacco: Never Used  . Alcohol Use: Yes     Comment: social   OB History    Gravida Para Term Preterm AB TAB SAB Ectopic Multiple Living   4 2 2  2 2          Review of Systems  Constitutional: Negative for fever and chills.  Respiratory: Negative for shortness of breath.   Cardiovascular: Negative for chest pain.  Gastrointestinal:  Negative for nausea, vomiting, diarrhea and constipation.  Genitourinary: Negative for dysuria.  Neurological: Positive for headaches.  All other systems reviewed and are negative.     Allergies  Contrast media; Latex; and Lidocaine  Home Medications   Prior to Admission medications   Medication Sig Start Date End Date Taking? Authorizing Provider  hydrochlorothiazide (HYDRODIURIL) 25 MG tablet Take 25 mg by mouth daily.   Yes Historical Provider, MD  metoprolol tartrate (LOPRESSOR) 25 MG tablet Take 25 mg by mouth daily.    Yes Historical Provider, MD   BP 203/112 mmHg  Pulse 71  Temp(Src) 99.1 F (37.3 C) (Oral)  Resp 20  SpO2 99% Physical Exam  Constitutional: She is oriented to person, place, and time. She appears well-developed and well-nourished.  HENT:  Head: Normocephalic and atraumatic.  Eyes: Conjunctivae and EOM are normal. Pupils are equal, round, and reactive to light.  Neck: Normal range of motion. Neck supple.  Cardiovascular: Normal rate and regular rhythm.  Exam reveals no gallop and no friction rub.   No murmur heard. Pulmonary/Chest: Effort normal and breath sounds normal. No respiratory distress. She has no wheezes. She has no rales. She exhibits no tenderness.  Abdominal: Soft. Bowel sounds are normal. She exhibits no distension and no mass. There is no tenderness. There is no rebound and no guarding.  Musculoskeletal: Normal  range of motion. She exhibits no edema or tenderness.  Neurological: She is alert and oriented to person, place, and time.  CN 3-12 intact, speech is clear, movements are goal oriented, normal finger to nose, no pronator drift  Skin: Skin is warm and dry.  Psychiatric: She has a normal mood and affect. Her behavior is normal. Judgment and thought content normal.  Nursing note and vitals reviewed.   ED Course  Procedures (including critical care time) Labs Review Labs Reviewed  BASIC METABOLIC PANEL - Abnormal; Notable for the  following:    Glucose, Bld 101 (*)    Creatinine, Ser 1.10 (*)    Calcium 10.9 (*)    GFR calc non Af Amer 51 (*)    GFR calc Af Amer 59 (*)    All other components within normal limits  URINALYSIS, ROUTINE W REFLEX MICROSCOPIC (NOT AT [facility]) - Abnormal; Notable for the following:    APPearance CLOUDY (*)    Hgb urine dipstick TRACE (*)    Leukocytes, UA TRACE (*)    All other components within normal limits  URINE MICROSCOPIC-ADD ON - Abnormal; Notable for the following:    Squamous Epithelial / LPF 6-30 (*)    Bacteria, UA MANY (*)    All other components within normal limits  CBC    Imaging Review Ct Head Wo Contrast  06/24/2015  CLINICAL DATA:  Headache. High blood pressure. Blurred vision. Finger tip numbness. EXAM: CT HEAD WITHOUT CONTRAST TECHNIQUE: Contiguous axial images were obtained from the base of the skull through the vertex without intravenous contrast. COMPARISON:  08/06/2009 FINDINGS: Sinuses/Soft tissues: Minimal motion degradation. This portion exam was repeated. Normal imaged paranasal sinuses and mastoid air cells. Intracranial: No mass lesion, hemorrhage, hydrocephalus, acute infarct, intra-axial, or extra-axial fluid collection. IMPRESSION: Normal head CT. Electronically Signed   By: Abigail Miyamoto M.D.   On: 06/24/2015 16:35   I have personally reviewed and evaluated these images and lab results as part of my medical decision-making.   MDM   Final diagnoses:  Essential hypertension    Patient with hypertension and headache. States she has been hypertensive to 200s over 110s. She also complains persistent throbbing headache. She had negative CT head last night. Patient discussed with Dr. Gilford Raid and reviewed BP and HR, who recommends giving labetalol.  Will reassess.  10:20 PM Patient seen by and discussed with Dr. Gilford Raid, recommends giving some IV pain meds and discharge with PCP follow-up.  Patient had negative CT last night.  She is neurovascularly  intact.  Patient seen again by Dr. Gilford Raid, patient requests stat MRI.  Patient is neurovascularly intact.  No indication for MRI.  DC to home.    Montine Circle, PA-C 06/25/15 2237

## 2015-06-25 NOTE — ED Notes (Signed)
Pt states over the couple days her blood pressure has been running high over the last few days. States as high as 220/110, was seen yesterday for it. Pt states she's been taking her BP meds regularly. C/o headache and nausea since yesterday. Was treated with BP meds and pain meds yesterday with improvement, was not given prescriptions to continue those BP meds an instructed to follow up with PCP. Has appointment with her tomorrow but feels terrible and is requesting MRI at this time. No obvious neuro deficits observed in triage.

## 2015-06-25 NOTE — Discharge Instructions (Signed)
Hypertension Hypertension, commonly called high blood pressure, is when the force of blood pumping through your arteries is too strong. Your arteries are the blood vessels that carry blood from your heart throughout your body. A blood pressure reading consists of a higher number over a lower number, such as 110/72. The higher number (systolic) is the pressure inside your arteries when your heart pumps. The lower number (diastolic) is the pressure inside your arteries when your heart relaxes. Ideally you want your blood pressure below 120/80. Hypertension forces your heart to work harder to pump blood. Your arteries may become narrow or stiff. Having untreated or uncontrolled hypertension can cause heart attack, stroke, kidney disease, and other problems. RISK FACTORS Some risk factors for high blood pressure are controllable. Others are not.  Risk factors you cannot control include:   Race. You may be at higher risk if you are African American.  Age. Risk increases with age.  Gender. Men are at higher risk than women before age 45 years. After age 65, women are at higher risk than men. Risk factors you can control include:  Not getting enough exercise or physical activity.  Being overweight.  Getting too much fat, sugar, calories, or salt in your diet.  Drinking too much alcohol. SIGNS AND SYMPTOMS Hypertension does not usually cause signs or symptoms. Extremely high blood pressure (hypertensive crisis) may cause headache, anxiety, shortness of breath, and nosebleed. DIAGNOSIS To check if you have hypertension, your health care provider will measure your blood pressure while you are seated, with your arm held at the level of your heart. It should be measured at least twice using the same arm. Certain conditions can cause a difference in blood pressure between your right and left arms. A blood pressure reading that is higher than normal on one occasion does not mean that you need treatment. If  it is not clear whether you have high blood pressure, you may be asked to return on a different day to have your blood pressure checked again. Or, you may be asked to monitor your blood pressure at home for 1 or more weeks. TREATMENT Treating high blood pressure includes making lifestyle changes and possibly taking medicine. Living a healthy lifestyle can help lower high blood pressure. You may need to change some of your habits. Lifestyle changes may include:  Following the DASH diet. This diet is high in fruits, vegetables, and whole grains. It is low in salt, red meat, and added sugars.  Keep your sodium intake below 2,300 mg per day.  Getting at least 30-45 minutes of aerobic exercise at least 4 times per week.  Losing weight if necessary.  Not smoking.  Limiting alcoholic beverages.  Learning ways to reduce stress. Your health care provider may prescribe medicine if lifestyle changes are not enough to get your blood pressure under control, and if one of the following is true:  You are 18-59 years of age and your systolic blood pressure is above 140.  You are 60 years of age or older, and your systolic blood pressure is above 150.  Your diastolic blood pressure is above 90.  You have diabetes, and your systolic blood pressure is over 140 or your diastolic blood pressure is over 90.  You have kidney disease and your blood pressure is above 140/90.  You have heart disease and your blood pressure is above 140/90. Your personal target blood pressure may vary depending on your medical conditions, your age, and other factors. HOME CARE INSTRUCTIONS    Have your blood pressure rechecked as directed by your health care provider.   Take medicines only as directed by your health care provider. Follow the directions carefully. Blood pressure medicines must be taken as prescribed. The medicine does not work as well when you skip doses. Skipping doses also puts you at risk for  problems.  Do not smoke.   Monitor your blood pressure at home as directed by your health care provider. SEEK MEDICAL CARE IF:   You think you are having a reaction to medicines taken.  You have recurrent headaches or feel dizzy.  You have swelling in your ankles.  You have trouble with your vision. SEEK IMMEDIATE MEDICAL CARE IF:  You develop a severe headache or confusion.  You have unusual weakness, numbness, or feel faint.  You have severe chest or abdominal pain.  You vomit repeatedly.  You have trouble breathing. MAKE SURE YOU:   Understand these instructions.  Will watch your condition.  Will get help right away if you are not doing well or get worse.   This information is not intended to replace advice given to you by your health care provider. Make sure you discuss any questions you have with your health care provider.   Document Released: 02/14/2005 Document Revised: 07/01/2014 Document Reviewed: 12/07/2012 Elsevier Interactive Patient Education 2016 Elsevier Inc.  

## 2015-06-25 NOTE — ED Notes (Signed)
Goal MAP of 110

## 2015-07-06 ENCOUNTER — Encounter: Payer: Self-pay | Admitting: Cardiovascular Disease

## 2015-07-06 ENCOUNTER — Ambulatory Visit (INDEPENDENT_AMBULATORY_CARE_PROVIDER_SITE_OTHER): Payer: BLUE CROSS/BLUE SHIELD | Admitting: Cardiovascular Disease

## 2015-07-06 VITALS — BP 133/85 | HR 68 | Ht 60.0 in | Wt 138.0 lb

## 2015-07-06 DIAGNOSIS — I10 Essential (primary) hypertension: Secondary | ICD-10-CM | POA: Diagnosis not present

## 2015-07-06 DIAGNOSIS — R079 Chest pain, unspecified: Secondary | ICD-10-CM

## 2015-07-06 DIAGNOSIS — E785 Hyperlipidemia, unspecified: Secondary | ICD-10-CM

## 2015-07-06 MED ORDER — CARVEDILOL 6.25 MG PO TABS: 6.2500 mg | ORAL_TABLET | Freq: Two times a day (BID) | ORAL | Status: DC

## 2015-07-06 NOTE — Patient Instructions (Addendum)
Medication Instructions:  STOP Metoprolol and START Carvedilol 6.25 mg twice a day  Labwork: NONE  Testing/Procedures: Your physician has requested that you have a renal artery duplex. During this test, an ultrasound is used to evaluate blood flow to the kidneys. Allow one hour for this exam. Do not eat after midnight the day before and avoid carbonated beverages. Take your medications as you usually do.  Your physician has requested that you have en exercise stress myoview. For further information please visit [email or web address]. Please follow instruction sheet, as given.   Follow-Up: 2 Weeks  Any Other Special Instructions Will Be Listed Below (If Applicable). Please increase carvedilol to 12.5mg  twice daily if your blood pressure is >140/90  Please do not take carvedilol the night before or the day of your stress test.  That morning take Edarbi 80 mg instead of 40mg .   If you need a refill on your cardiac medications before your next appointment, please call your pharmacy.

## 2015-07-06 NOTE — Progress Notes (Signed)
Cardiology Office Note   Date:  07/06/2015   ID:  Virginia Delgado, DOB [birth year], MRN [id number]  PCP:  Virginia Greenland, MD  Cardiologist:   Virginia Latch, MD   Chief Complaint  Patient presents with  . New Patient (Initial Visit)    SOB;none.CHEST PAIN;tightness. LIGHTHEADED/DIZZINESS;in the mornings. PAIN OR CRAMPING IN LEGS;none. EDEMA; none.     History of Present Illness: Virginia Delgado is a 70 y.o. female with hypertension and Hepatitis C s/p treatment who presents for management of hypertension.  She was diagnosed with hypertension 12 years ago.  She was previously treated with HCTZ and metoprolol and her BP has been well-controlled until a few weeks ago.  When her BP is elevated she can feel her heart beating harder.   She checked it at home and was between 186-200/100s.  She also noted headaches and blurry vision.  When this continued for several days she decided to go to th ED.  Virginia Delgado was seen in the ED on 4/27.  Her BP was in the 200s/110s.  She was started on amlodipine and received ativan.  Head CT was negative for stroke.  She followed up with her PCP, Dr. Glendale Delgado.  Virginia Delgado started her on Edarbi and amlodipine.  Since then her BP has been better but fluctuates from the [id number] systolic.  She continues to have headaches.    Virginia Delgado occasioanlly notes chest discomfort.  She describes it as a tightening in her chest.  The episodes last around 10 minutes at a time and are not associated with shortness of breath, nausea or diaphoresis.  There is no radiation.  She denies lower extremity edema, orthopnea or PND.     Father MI 12   Exercises 3/week with treadmill, wak, job, weights 1 hour     Past Medical History  Diagnosis Date  . Hypertension   . Hx of hepatitis C   . Dermatitis     Past Surgical History  Procedure Laterality Date  . Cesarean section    . Umbilical hernia repair       Current Outpatient Prescriptions  Medication Sig Dispense Refill    . amLODipine (NORVASC) 5 MG tablet Take 5 mg by mouth daily.    . Azilsartan Medoxomil (EDARBI) 40 MG TABS Take by mouth daily.    . hydrochlorothiazide (HYDRODIURIL) 25 MG tablet Take 25 mg by mouth daily.    . carvedilol (COREG) 6.25 MG tablet Take 1 tablet (6.25 mg total) by mouth 2 (two) times daily. 60 tablet 9  . HYDROcodone-acetaminophen (NORCO/VICODIN) 5-325 MG tablet Take 1-2 tablets by mouth every 6 (six) hours as needed. (Patient not taking: Reported on 07/06/2015) 6 tablet 0   No current facility-administered medications for this visit.    Allergies:   Contrast media; Latex; and Lidocaine    Social History:  The patient  reports that she has never smoked. She has never used smokeless tobacco. She reports that she drinks alcohol. She reports that she does not use illicit drugs.   Family History:  The patient's family history includes Delgado in her mother and sister; Heart disease in her father.    ROS:  Please see the history of present illness.   Otherwise, review of systems are positive for none.   All other systems are reviewed and negative.    PHYSICAL EXAM: VS:  BP 133/85 mmHg  Pulse 68  Ht 5' (1.524 m)  Wt 62.596 kg (138 lb)  BMI 26.95 kg/m2 ,  BMI Body mass index is 26.95 kg/(m^2). GENERAL:  Well appearing HEENT:  Pupils equal round and reactive, fundi not visualized, oral mucosa unremarkable NECK:  No jugular venous distention, waveform within normal limits, carotid upstroke brisk and symmetric, no bruits, no thyromegaly LYMPHATICS:  No cervical adenopathy LUNGS:  Clear to auscultation bilaterally HEART:  RRR.  PMI not displaced or sustained,S1 and S2 within normal limits, no S3, no S4, no clicks, no rubs, no murmurs ABD:  Flat, positive bowel sounds normal in frequency in pitch, no bruits, no rebound, no guarding, no midline pulsatile mass, no hepatomegaly, no splenomegaly EXT:  2 plus pulses throughout, no edema, no cyanosis no clubbing SKIN:  No rashes no  nodules NEURO:  Cranial nerves II through XII grossly intact, motor grossly intact throughout PSYCH:  Cognitively intact, oriented to person place and time   EKG:  EKG is not ordered today. The ekg ordered 06/24/15 demonstrates sinus rhythm rate 80 bpm.   Recent Labs: 06/24/2015: ALT 15 06/25/2015: BUN 12; Creatinine, Ser 1.10*; Hemoglobin 13.6; Platelets 216; Potassium 3.8; Sodium 139   05/20/15: Hemoglobin A1c 5.7% Total cholesterol 238, tri 96, HDL 80, LDL 139 AST 19 ALT 14  Lipid Panel No results found for: CHOL, TRIG, HDL, CHOLHDL, VLDL, LDLCALC, LDLDIRECT    Wt Readings from Last 3 Encounters:  07/06/15 62.596 kg (138 lb)  06/24/15 61.689 kg (136 lb)  07/19/11 59.421 kg (131 lb)      ASSESSMENT AND PLAN:  # Hypertension: Virginia Delgado BP is well-controlled today.  However, it has been poorly-controlled at home.  I checked her BP here compared with her home monitor and it was within 10 mmHg.  We will stop metoprolol and start carvedilol 6.25mg  bid.  If her BP is not <140/90 she will increase to 12.5 mg bid.  It seems odd that her BP was well-controlled for so long and then increased so significantly.  We will also check a renal artery Dopplers to rule out renal artery stenosis.  If this is negative, we will have her hold the ARB so that we can check serum renin and aldosterone levels.  Continue amlodipine, azilsartan and HCTZ.  # Chest pain: Virginia Delgado has chest pain that is concerning for ischemia.  We will obtain an exercise Cardiolite.  # CV Disease Prevention: ASCVD 10 year risk is 10%.  We will discuss statins and aspirin at her follow up appointment.    Current medicines are reviewed at length with the patient today.  The patient does not have concerns regarding medicines.  The following changes have been made:  Stop metoprolol.  Start carvedilol.  Labs/ tests ordered today include:   Orders Placed This Encounter  Procedures  . Myocardial Perfusion Imaging      Disposition:   FU with Virginia Doane C. Oval Linsey, MD, [facility] in 2 weeks   This note was written with the assistance of speech recognition software.  Please excuse any transcriptional errors.  Signed, Virginia Delgado Baik C. Oval Linsey, MD, [facility]  07/06/2015 12:44 PM    [facility]

## 2015-07-07 ENCOUNTER — Other Ambulatory Visit: Payer: Self-pay | Admitting: Internal Medicine

## 2015-07-07 DIAGNOSIS — R42 Dizziness and giddiness: Secondary | ICD-10-CM

## 2015-07-08 ENCOUNTER — Encounter: Payer: Self-pay | Admitting: Cardiovascular Disease

## 2015-07-09 ENCOUNTER — Ambulatory Visit (HOSPITAL_COMMUNITY)
Admission: RE | Admit: 2015-07-09 | Discharge: 2015-07-09 | Disposition: A | Discharge status: Home / Self Care | Payer: BLUE CROSS/BLUE SHIELD | Source: Ambulatory Visit | Attending: Cardiovascular Disease | Admitting: Cardiovascular Disease

## 2015-07-09 DIAGNOSIS — I10 Essential (primary) hypertension: Secondary | ICD-10-CM | POA: Diagnosis not present

## 2015-07-13 ENCOUNTER — Ambulatory Visit
Admission: RE | Admit: 2015-07-13 | Discharge: 2015-07-13 | Disposition: A | Discharge status: Home / Self Care | Payer: BLUE CROSS/BLUE SHIELD | Source: Ambulatory Visit | Attending: Internal Medicine | Admitting: Internal Medicine

## 2015-07-13 DIAGNOSIS — R42 Dizziness and giddiness: Secondary | ICD-10-CM

## 2015-07-13 DIAGNOSIS — R413 Other amnesia: Secondary | ICD-10-CM | POA: Diagnosis not present

## 2015-07-13 MED ORDER — GADOBENATE DIMEGLUMINE 529 MG/ML IV SOLN
12.0000 mL | Freq: Once | INTRAVENOUS | Status: AC | PRN
  Administered 2015-07-13: 12 mL via INTRAVENOUS

## 2015-07-15 ENCOUNTER — Telehealth (HOSPITAL_COMMUNITY): Payer: Self-pay

## 2015-07-15 NOTE — Telephone Encounter (Signed)
Encounter complete. 

## 2015-07-17 ENCOUNTER — Ambulatory Visit (HOSPITAL_COMMUNITY)
Admission: RE | Admit: 2015-07-17 | Discharge: 2015-07-17 | Disposition: A | Discharge status: Home / Self Care | Payer: BLUE CROSS/BLUE SHIELD | Source: Ambulatory Visit | Attending: Internal Medicine | Admitting: Internal Medicine

## 2015-07-17 DIAGNOSIS — R42 Dizziness and giddiness: Secondary | ICD-10-CM | POA: Insufficient documentation

## 2015-07-17 DIAGNOSIS — I10 Essential (primary) hypertension: Secondary | ICD-10-CM | POA: Insufficient documentation

## 2015-07-17 DIAGNOSIS — I341 Nonrheumatic mitral (valve) prolapse: Secondary | ICD-10-CM | POA: Insufficient documentation

## 2015-07-17 DIAGNOSIS — R079 Chest pain, unspecified: Secondary | ICD-10-CM | POA: Diagnosis not present

## 2015-07-17 DIAGNOSIS — Z8619 Personal history of other infectious and parasitic diseases: Secondary | ICD-10-CM | POA: Insufficient documentation

## 2015-07-17 DIAGNOSIS — R0609 Other forms of dyspnea: Secondary | ICD-10-CM | POA: Diagnosis not present

## 2015-07-17 DIAGNOSIS — Z8249 Family history of ischemic heart disease and other diseases of the circulatory system: Secondary | ICD-10-CM | POA: Diagnosis not present

## 2015-07-17 LAB — MYOCARDIAL PERFUSION IMAGING
CHL CUP NUCLEAR SDS: 6
CHL CUP NUCLEAR SSS: 6
CSEPED: 7 min
CSEPHR: 98 %
CSEPPHR: 151 {beats}/min
Estimated workload: 8.5 METS
LV dias vol: 76 mL (ref 46–106)
LVSYSVOL: 28 mL
MPHR: 153 {beats}/min
RPE: 16
Rest HR: 64 {beats}/min
SRS: 0
TID: 1.22

## 2015-07-17 MED ORDER — TECHNETIUM TC 99M TETROFOSMIN IV KIT
9.6000 | PACK | Freq: Once | INTRAVENOUS | Status: AC | PRN
  Administered 2015-07-17: 9.6 via INTRAVENOUS
  Filled 2015-07-17: qty 10

## 2015-07-17 MED ORDER — TECHNETIUM TC 99M TETROFOSMIN IV KIT
29.4000 | PACK | Freq: Once | INTRAVENOUS | Status: AC | PRN
  Administered 2015-07-17: 29.4 via INTRAVENOUS
  Filled 2015-07-17: qty 29

## 2015-07-23 NOTE — Progress Notes (Signed)
Cardiology Office Note   Date:  07/24/2015   ID:  Virginia Delgado, DOB 1945/09/06, MRN [id number]  PCP:  Maximino Greenland, MD  Cardiologist:   Skeet Latch, MD   Chief Complaint  Patient presents with  . Follow-up    lightheaded/dizziness; everyday.     History of Present Illness: Virginia Delgado is a 70 y.o. female with hypertension and Hepatitis C s/p treatment who presents for management of hypertension.  She was diagnosed with hypertension 12 years ago.  She was previously treated with HCTZ and metoprolol and her BP has been well-controlled until 05/2015.  [name] was seen in the ED on 4/27.  Her BP was in the 200s/110s.  She was started on amlodipine and received ativan.  Head CT was negative for stroke.  She followed up with her PCP, Dr. Glendale Chard, who started her on Edarbi and amlodipine.  She was seen in clinic 07/06/15, at which time metoprolol was switched to carvedilol.  Renal artery Doppler was negative for stenosis. She also had a exercise Cardiolite that was negative for ischemia.  She achieved 8.5 METS.  [name] continues to have difficulty controlling her blood pressure.  It has been mostly in the 140s-160s.  She tried taking  carvedilol but felt very fatued and had muscle aches.  She spoke with Dr. Baird Cancer who recommended reducing the dose.  She is now taking 3.125 mg and has been feeling well most days.  Her blood pressure is typically high in the morning and improves throughout the day.  This morning it was 163/95.    Past Medical History  Diagnosis Date  . Hypertension   . Hx of hepatitis C   . Dermatitis     Past Surgical History  Procedure Laterality Date  . Cesarean section    . Umbilical hernia repair       Current Outpatient Prescriptions  Medication Sig Dispense Refill  . amLODipine (NORVASC) 10 MG tablet Take 1 tablet (10 mg total) by mouth daily. 30 tablet 5  . Azilsartan Medoxomil (EDARBI) 40 MG TABS Take 40 mg by mouth daily. 30 tablet 5  .  hydrochlorothiazide (HYDRODIURIL) 25 MG tablet Take 25 mg by mouth daily.    Marland Kitchen HYDROcodone-acetaminophen (NORCO/VICODIN) 5-325 MG tablet Take 1-2 tablets by mouth every 6 (six) hours as needed. 6 tablet 0   No current facility-administered medications for this visit.    Allergies:   Contrast media; Gadolinium derivatives; Lidocaine; and Latex    Social History:  The patient  reports that she has never smoked. She has never used smokeless tobacco. She reports that she drinks alcohol. She reports that she does not use illicit drugs.   Family History:  The patient's family history includes Cancer in her mother and sister; Heart disease in her father; Hypertension in her brother and sister; Ovarian cancer in her maternal grandmother.    ROS:  Please see the history of present illness.   Otherwise, review of systems are positive for none.   All other systems are reviewed and negative.    PHYSICAL EXAM: VS:  BP 147/84 mmHg  Pulse 67  Ht 5' (1.524 m)  Wt 62.596 kg (138 lb)  BMI 26.95 kg/m2 , BMI Body mass index is 26.95 kg/(m^2). GENERAL:  Well appearing HEENT:  Pupils equal round and reactive, fundi not visualized, oral mucosa unremarkable NECK:  No jugular venous distention, waveform within normal limits, carotid upstroke brisk and symmetric, no bruits LYMPHATICS:  No cervical adenopathy  LUNGS:  Clear to auscultation bilaterally HEART:  RRR.  PMI not displaced or sustained,S1 and S2 within normal limits, no S3, no S4, no clicks, no rubs, no murmurs ABD:  Flat, positive bowel sounds normal in frequency in pitch, no bruits, no rebound, no guarding, no midline pulsatile mass, no hepatomegaly, no splenomegaly EXT:  2 plus pulses throughout, no edema, no cyanosis no clubbing SKIN:  No rashes no nodules NEURO:  Cranial nerves II through XII grossly intact, motor grossly intact throughout PSYCH:  Cognitively intact, oriented to person place and time   EKG:  EKG is not ordered today. The ekg  ordered 06/24/15 demonstrates sinus rhythm rate 80 bpm.  Renal artery ultrasound 07/09/15: Normal renal arteries  Lexiscan Cardiolite 07/17/15:  Nuclear stress EF: 64%.  The left ventricular ejection fraction is normal (55-65%).  Blood pressure demonstrated a hypertensive response to exercise.  ST segment depression was noted during stress in the II, III, aVF, V5 and V6 leads.  The study is normal.   Recent Labs: 06/24/2015: ALT 15 06/25/2015: BUN 12; Creatinine, Ser 1.10*; Hemoglobin 13.6; Platelets 216; Potassium 3.8; Sodium 139   05/20/15: Hemoglobin A1c 5.7% Total cholesterol 238, tri 96, HDL 80, LDL 139 AST 19 ALT 14  Lipid Panel No results found for: CHOL, TRIG, HDL, CHOLHDL, VLDL, LDLCALC, LDLDIRECT    Wt Readings from Last 3 Encounters:  07/24/15 62.596 kg (138 lb)  07/17/15 62.596 kg (138 lb)  07/06/15 62.596 kg (138 lb)      ASSESSMENT AND PLAN:    Increase amlodipine to 10 mg If BP >140/90 increase Edarbi to 80 mg # Hypertension: [name] BP is consistently above goal.  We will stop carvedilol.  Increase amlodipine to 10 mg daily. If her BP remains elevated >140/90, we will increase azilsartan to 80 mg daily. Renal artery ultrasound was negative for renal artery stenosis. Continue amlodipine, azilsartan and HCTZ.  # Chest pain: Stress test was negative for ischemia.  Results were reviewed in clinic today.   # CV Disease Prevention: ASCVD 10 year risk is 10%.  We will discuss statins and aspirin at her follow up appointment.    Current medicines are reviewed at length with the patient today.  The patient does not have concerns regarding medicines.  The following changes have been made:  Stop carvedilol.  Increase amlodipine to 10 mg daily.   Labs/ tests ordered today include:   No orders of the defined types were placed in this encounter.     Disposition:   FU with Vickey Ewbank C. Oval Linsey, MD, [facility] in 3 months.  She will see one of our PAs in 1  month.  This note was written with the assistance of speech recognition software.  Please excuse any transcriptional errors.  Signed, Amardeep Beckers C. Oval Linsey, MD, [facility]  07/24/2015 1:43 PM    [facility]

## 2015-07-24 ENCOUNTER — Encounter: Payer: Self-pay | Admitting: Cardiovascular Disease

## 2015-07-24 ENCOUNTER — Ambulatory Visit (INDEPENDENT_AMBULATORY_CARE_PROVIDER_SITE_OTHER): Payer: BLUE CROSS/BLUE SHIELD | Admitting: Cardiovascular Disease

## 2015-07-24 VITALS — BP 147/84 | HR 67 | Ht 60.0 in | Wt 138.0 lb

## 2015-07-24 DIAGNOSIS — I10 Essential (primary) hypertension: Secondary | ICD-10-CM

## 2015-07-24 DIAGNOSIS — R0789 Other chest pain: Secondary | ICD-10-CM

## 2015-07-24 MED ORDER — AZILSARTAN MEDOXOMIL 40 MG PO TABS: 40.0000 mg | ORAL_TABLET | Freq: Every day | ORAL | Status: DC

## 2015-07-24 MED ORDER — AMLODIPINE BESYLATE 10 MG PO TABS: 10.0000 mg | ORAL_TABLET | Freq: Every day | ORAL | Status: DC

## 2015-07-24 NOTE — Patient Instructions (Signed)
Medication Instructions:  STOP CARVEDILOL   INCREASE AMLODIPINE TO 10 MG DAILY   Labwork: NONE  Testing/Procedures: NONE  Follow-Up: 1 MONTH WITH PHARM D FOR BLOOD PRESSURE   Any Other Special Instructions Will Be Listed Below (If Applicable). IF YOUR BLOOD PRESSURE DOES NOT GET BELOW 140/90 BY MID WEEK NEXT WEEK INCREASE THE EDARBI TO 80 MG DAILY. CALL THE OFFICE AND LET us KNOW IF INCREASE MADE SO WE CAN SEND A NEW RX TO PHARMACY   If you need a refill on your cardiac medications before your next appointment, please call your pharmacy.

## 2015-07-29 ENCOUNTER — Telehealth: Payer: Self-pay

## 2015-07-29 NOTE — Telephone Encounter (Signed)
PA form received from [employer] [email or web address] info -- Key: [id number] [facility]

## 2015-07-29 NOTE — Telephone Encounter (Signed)
If PA not approved for Edarbi, would consider valsartan 320 or olmesartan 40

## 2015-07-29 NOTE — Telephone Encounter (Signed)
Returned call. Pt was prescribed Edarbi by her PCP, Dr. Baird Cancer. Saw [place] recently to address HTN further, and her amlodipine was increased from 5mg  daily to 10mg  daily. Conditional recommendation was given that if her BP didn't respond adequately to the amlodipine increase, the Edarbi could be increased from 40mg  to 80mg  daily. She reports since then her BP has been in the [id number] systolic range.   She went to pharmacy to refill the Earnest Rosier, was informed a prior authorization would be required. She has samples of this med, enough to last next 5-6 days.  Allergies verified. She has not tried other ARBs, we discussed that a PA may take a few days to clear and may not go through if other similar meds haven't been trialed.  Aware that there are alternatives to this medication & that Dr. Oval Linsey recommended trying other meds if not covered by insurance. Will route for recommendations.

## 2015-07-29 NOTE — Telephone Encounter (Signed)
Patient called in about Virginia Delgado and is having trouble getting medication and would like call back from nurse.

## 2015-07-29 NOTE — Telephone Encounter (Signed)
New message  2nd attempt   Patient called in about Edarbi and is having trouble getting medication and would like call back from nurse.

## 2015-08-06 ENCOUNTER — Encounter: Payer: Self-pay | Admitting: *Deleted

## 2015-08-06 NOTE — Telephone Encounter (Signed)
This encounter was created in error - please disregard.

## 2015-08-06 NOTE — Telephone Encounter (Signed)
Followed up on submission in covermymeds It was did not submit Printed and faxed to PA

## 2015-08-10 ENCOUNTER — Telehealth: Payer: Self-pay | Admitting: Cardiovascular Disease

## 2015-08-10 NOTE — Telephone Encounter (Signed)
SEE PREVIOUS  NOTE CALLED [facility] - MEDICATION ( EDARBI)  DENIED PATIENT HAS TO TRY AND FAIL/ INTOLERANT -- ANY ACE INHIB. - GENERIC BENAZEPRIL, CAPTOPRIL,LISINOPRIL, ENALAPRIL, FOSINOPRIL, RAMIPRIL ,TRANDOPRIL  WILL DEFER TO DR [place] DO YOU WANT FOLLOW SUGGESTION OF PHARMACIST?--- VALSARTAN 320 MG OR OLMESARTAN 40 MG  NOTIFIED PATIENT REQUEST T LEAVE MESSAGE ON VOICE MAIL

## 2015-08-10 NOTE — Telephone Encounter (Signed)
She can try lisinopril 40 mg daily if she has to try an ACE-I.  If an ARB is permitted, Valsartan 320 mg daily.

## 2015-08-10 NOTE — Telephone Encounter (Signed)
New message      Did we get prior approval for edarbi 40mg ?  Pt has 3 pills left.  Please call

## 2015-08-11 MED ORDER — LISINOPRIL 40 MG PO TABS: 40.0000 mg | ORAL_TABLET | Freq: Every day | ORAL | Status: DC

## 2015-08-11 NOTE — Telephone Encounter (Signed)
Spoke to patient-EDARBI was denied by pharmacy  aware of the change to lisinopril 40 mg daily  E sent to pharmacy

## 2015-08-27 ENCOUNTER — Ambulatory Visit (INDEPENDENT_AMBULATORY_CARE_PROVIDER_SITE_OTHER): Payer: Medicare Other | Admitting: Pharmacist

## 2015-08-27 ENCOUNTER — Encounter: Payer: Self-pay | Admitting: Pharmacist

## 2015-08-27 VITALS — BP 124/76 | Wt 140.0 lb

## 2015-08-27 DIAGNOSIS — I10 Essential (primary) hypertension: Secondary | ICD-10-CM

## 2015-08-27 MED ORDER — VALSARTAN 320 MG PO TABS: 320.0000 mg | ORAL_TABLET | Freq: Every day | ORAL | Status: DC

## 2015-08-27 NOTE — Assessment & Plan Note (Signed)
BP is at goal today; however she has been experiencing swelling in her legs. Though I question if the swelling is associated with the lisinopril vs. Amlodipine dose increase, she believes it is the lisinopril and wishing to change this medication. She would like to go back on [place] but given her current insurance situation, I believe this medication will still be too expensive for her. We will try valsartan 320mg  daily to replace the lisinopril. She is to call the office if valsartan is still too expensive. Continue to monitor blood pressure at home and follow up in 1 month.

## 2015-08-27 NOTE — Patient Instructions (Addendum)
Return for a a follow up appointment in 1 month  - call [phone] if medication is still to expensive  Your blood pressure today is 124/76   Check your blood pressure at home daily (if able) and keep record of the readings.  Take your BP meds as follows: Start taking valsartan 320mg  once daily Stop taking lisinopril 40mg    Bring all of your meds, your BP cuff and your record of home blood pressures to your next appointment.  Exercise as you're able, try to walk approximately 30 minutes per day.  Keep salt intake to a minimum, especially watch canned and prepared boxed foods.  Eat more fresh fruits and vegetables and fewer canned items.  Avoid eating in fast food restaurants.    HOW TO TAKE YOUR BLOOD PRESSURE: . Rest 5 minutes before taking your blood pressure. .  Don't smoke or drink caffeinated beverages for at least 30 minutes before. . Take your blood pressure before (not after) you eat. . Sit comfortably with your back supported and both feet on the floor (don't cross your legs). . Elevate your arm to heart level on a table or a desk. . Use the proper sized cuff. It should fit smoothly and snugly around your bare upper arm. There should be enough room to slip a fingertip under the cuff. The bottom edge of the cuff should be 1 inch above the crease of the elbow. . Ideally, take 3 measurements at one sitting and record the average.

## 2015-08-27 NOTE — Progress Notes (Signed)
Patient ID: Kyliah Huyett                 DOB: Dec 26, 1945                      MRN: [id number]     HPI: Virginia Delgado is a 70 y.o. female referred by Dr. Oval Linsey to HTN clinic for evaluation.  She reports that she has been feeling well since her last visit. She does report that since starting the lisinopril about 2 weeks ago she has had some leg swelling. Of note her amlodipine dose was also increased about a month ago.   She also reports that her husband's insurance has changed again and she currently only has medicare.   Cardiac Hx: hypertension  Current HTN meds:  Amlodipine 10mg  daily in the afternoon HCTZ 25mg  in the morning Lisinopril 40mg  in the morning  Previously tried:  Carvedilol, metoprolol - not as effective at lowering blood pressure  BP goal: <150/90  Family History: Father died at 66 yo with angina. Mother is a cancer survivor who has hypertension but is otherwise healthy. She is 1 of 5. Her youngest sibling passed away at 85 yo secondary to sarcoma. 2 of her other siblings have hypertension or cholesterol issues.   Social History: denies tobacco. Drinks alcohol socially about 1-2 times a month.   Diet: She states she eats very healthy. Nothing fried, no junk food. She states she is not a vegan but eats no red meat or pork and her diet consists mostly of home grown veggies.   Exercise: She was previously exercising 3 times a week walking 1.5 miles and doing mild strength training. She has not been as active recently due to her pressure being up but now that we have better control she plans to get back into working out.   Home BP readings:  She has an arm cuff and checks her pressure 3 times a day One reading over the last few weeks [id number] systolic which she reports was just after she ran up the stairs and she was very stressed about a personal situation. She notes that her pressures are very much associated with her emotional state.   The week of 6/4 her avg BP was 125/70  and pulse 74 Following week was avg 130/72 This week avg has been 125/75 pulse 68  Wt Readings from Last 3 Encounters:  07/24/15 138 lb (62.596 kg)  07/17/15 138 lb (62.596 kg)  07/06/15 138 lb (62.596 kg)   BP Readings from Last 3 Encounters:  07/24/15 147/84  07/13/15 147/83  07/06/15 133/85   Pulse Readings from Last 3 Encounters:  07/24/15 67  07/13/15 62  07/06/15 68    Renal function: CrCl cannot be calculated (Unknown ideal weight.).  Past Medical History  Diagnosis Date  . Hypertension   . Hx of hepatitis C   . Dermatitis     Current Outpatient Prescriptions on File Prior to Visit  Medication Sig Dispense Refill  . amLODipine (NORVASC) 10 MG tablet Take 1 tablet (10 mg total) by mouth daily. 30 tablet 5  . hydrochlorothiazide (HYDRODIURIL) 25 MG tablet Take 25 mg by mouth daily.    Marland Kitchen HYDROcodone-acetaminophen (NORCO/VICODIN) 5-325 MG tablet Take 1-2 tablets by mouth every 6 (six) hours as needed. 6 tablet 0  . lisinopril (PRINIVIL,ZESTRIL) 40 MG tablet Take 1 tablet (40 mg total) by mouth daily. 30 tablet 6   No current facility-administered medications on file prior to visit.  Allergies  Allergen Reactions  . Contrast Media [Iodinated Diagnostic Agents] Swelling    Of face, especially eyes  . Gadolinium Derivatives Other (See Comments)    Felt extremities go numb briefly as contrast injected.  Had cough (bronchospasm?) briefly after scan.  Resolved on own without treatment.  07/13/15  . Lidocaine Other (See Comments)    Facial swelling after dental procedure  . Latex Rash and Other (See Comments)    and irritation     Assessment/Plan: Hypertension: BP is at goal today; however she has been experiencing swelling in her legs. Though I question if the swelling is associated with the lisinopril vs. Amlodipine dose increase, she believes it is the lisinopril and wishing to change this medication. She would like to go back on [place] but given her current  insurance situation, I believe this medication will still be too expensive for her. We will try valsartan 320mg  daily to replace the lisinopril. She is to call the office if valsartan is still too expensive. Continue to monitor blood pressure at home and follow up in 1 month.     Thank you, Lelan Pons. Patterson Hammersmith, [place]

## 2015-09-03 DIAGNOSIS — L219 Seborrheic dermatitis, unspecified: Secondary | ICD-10-CM | POA: Diagnosis not present

## 2015-09-03 DIAGNOSIS — L819 Disorder of pigmentation, unspecified: Secondary | ICD-10-CM | POA: Diagnosis not present

## 2015-09-24 ENCOUNTER — Ambulatory Visit (INDEPENDENT_AMBULATORY_CARE_PROVIDER_SITE_OTHER): Payer: Medicare Other | Admitting: Pharmacist

## 2015-09-24 ENCOUNTER — Ambulatory Visit: Payer: Medicare Other

## 2015-09-24 VITALS — BP 122/76 | Wt 140.0 lb

## 2015-09-24 DIAGNOSIS — I10 Essential (primary) hypertension: Secondary | ICD-10-CM

## 2015-09-24 NOTE — Progress Notes (Signed)
Patient ID: Virginia Delgado                 DOB: [birth year]                      MRN: [id number]     HPI: Ilo Prewitt is a 70 y.o. female patient of Dr. Oval Linsey who presents to HTN clinic for follow up.   She reports she tried the valsartan but was experiencing a persistent cough. Luckily about the day she was going to call about the cough she received word from Dr. Earnie Larsson office that Earnest Rosier was available for $40 at [facility]. This is affordable to her and she seems to do well on Edarbi so she changed herself back to [place] and discontinued the valsartan.   Cardiac Hx: HTN  Current HTN meds:  Amlodipine 10mg  daily in the afternoon HCTZ 25mg  daily in the morning Edarbi 40mg  daily in the morning  Previously tried:  Carvedilol, metoprolol - not as effective at lowering BP Lisinopril - caused swelling Valsartan - caused cough  BP goal: <150/90  Family History: Father died at 33 yo with angina. Mother is a cancer survivor who has hypertension but is otherwise healthy. She is 1 of 5. Her youngest sibling passed away at 73 yo secondary to sarcoma. 2 of her other siblings have hypertension or cholesterol issues.   Social History: denies tobacco. Drinks alcohol socially about 1-2 times a month.   Diet: She states she eats very healthy. Nothing fried, no junk food. She states she is not a vegan but eats no red meat or pork and her diet consists mostly of home grown veggies.   Exercise: She was previously exercising 3 times a week walking 1.5 miles and doing mild strength training. She has not been as active recently due to her pressure being up but now that we have better control she plans to get back into working out.   Home BP readings:  She has not had any readings 0000000 systolic since restarting the Edarbi. She reports her pressures have been slightly "high" this week because she has been keeping her grandchildren.   Wt Readings from Last 3 Encounters:  09/24/15 140 lb (63.5 kg)    08/27/15 140 lb (63.5 kg)  07/24/15 138 lb (62.6 kg)   BP Readings from Last 3 Encounters:  09/24/15 122/76  08/27/15 124/76  07/24/15 (!) 147/84   Pulse Readings from Last 3 Encounters:  07/24/15 67  07/13/15 62  07/06/15 68    Renal function: CrCl cannot be calculated (Patient's most recent lab result is older than the maximum 21 days allowed.).  Past Medical History:  Diagnosis Date  . Dermatitis   . Hx of hepatitis C   . Hypertension     Current Outpatient Prescriptions on File Prior to Visit  Medication Sig Dispense Refill  . amLODipine (NORVASC) 10 MG tablet Take 1 tablet (10 mg total) by mouth daily. 30 tablet 5  . aspirin 81 MG tablet Take 81 mg by mouth daily.    . cholecalciferol (VITAMIN D) 1000 units tablet Take 1,000 Units by mouth daily.    Marland Kitchen co-enzyme Q-10 50 MG capsule Take 50 mg by mouth daily.    . hydrochlorothiazide (HYDRODIURIL) 25 MG tablet Take 25 mg by mouth daily.    . magnesium 30 MG tablet Take 30 mg by mouth 2 (two) times daily.    . NONI, MORINDA CITRIFOLIA, PO Take 1 capsule by mouth daily.    Marland Kitchen  Omega-3 Fatty Acids (FISH OIL) 1200 MG CAPS Take 1 capsule by mouth daily.     No current facility-administered medications on file prior to visit.     Allergies  Allergen Reactions  . Contrast Media [Iodinated Diagnostic Agents] Swelling    Of face, especially eyes  . Gadolinium Derivatives Other (See Comments)    Felt extremities go numb briefly as contrast injected.  Had cough (bronchospasm?) briefly after scan.  Resolved on own without treatment.  07/13/15  . Lidocaine Other (See Comments)    Facial swelling after dental procedure  . Latex Rash and Other (See Comments)    and irritation     Assessment/Plan: Hypertension: BP is at goal and remains well controlled on current therapy. No changes today. Follow up with Dr. Oval Linsey as planned. Follow up with hypertension clinic as needed.   Thank you, Lelan Pons. Patterson Hammersmith, [facility]  09/24/2015 2:33 PM

## 2015-09-24 NOTE — Patient Instructions (Signed)
Return for a a follow up appointment with Dr. Oval Linsey  Your blood pressure today is 122/76   Check your blood pressure at home daily (if able) and keep record of the readings.  Take your BP meds as follows: Continue Edarbi, hydrochlorothiazide, and amlodipine  Bring all of your meds, your BP cuff and your record of home blood pressures to your next appointment.  Exercise as you're able, try to walk approximately 30 minutes per day.  Keep salt intake to a minimum, especially watch canned and prepared boxed foods.  Eat more fresh fruits and vegetables and fewer canned items.  Avoid eating in fast food restaurants.    HOW TO TAKE YOUR BLOOD PRESSURE: . Rest 5 minutes before taking your blood pressure. .  Don't smoke or drink caffeinated beverages for at least 30 minutes before. . Take your blood pressure before (not after) you eat. . Sit comfortably with your back supported and both feet on the floor (don't cross your legs). . Elevate your arm to heart level on a table or a desk. . Use the proper sized cuff. It should fit smoothly and snugly around your bare upper arm. There should be enough room to slip a fingertip under the cuff. The bottom edge of the cuff should be 1 inch above the crease of the elbow. . Ideally, take 3 measurements at one sitting and record the average.

## 2016-01-07 DIAGNOSIS — I129 Hypertensive chronic kidney disease with stage 1 through stage 4 chronic kidney disease, or unspecified chronic kidney disease: Secondary | ICD-10-CM | POA: Diagnosis not present

## 2016-01-07 DIAGNOSIS — Z6828 Body mass index (BMI) 28.0-28.9, adult: Secondary | ICD-10-CM | POA: Diagnosis not present

## 2016-01-07 DIAGNOSIS — N182 Chronic kidney disease, stage 2 (mild): Secondary | ICD-10-CM | POA: Diagnosis not present

## 2016-01-07 DIAGNOSIS — R7309 Other abnormal glucose: Secondary | ICD-10-CM | POA: Diagnosis not present

## 2016-01-20 DIAGNOSIS — Z1231 Encounter for screening mammogram for malignant neoplasm of breast: Secondary | ICD-10-CM | POA: Diagnosis not present

## 2016-01-20 DIAGNOSIS — Z853 Personal history of malignant neoplasm of breast: Secondary | ICD-10-CM | POA: Diagnosis not present

## 2016-02-17 DIAGNOSIS — R0982 Postnasal drip: Secondary | ICD-10-CM | POA: Diagnosis not present

## 2016-04-27 ENCOUNTER — Telehealth: Payer: Self-pay | Admitting: Cardiovascular Disease

## 2016-04-27 NOTE — Telephone Encounter (Signed)
Patient is calling about the Edarbi, 40 mg medication, she has been taking this medication for about 9 months and only had to pay $40. [facility] now informed her that the discount is no longer available and the prescription will cost $181. Patient would like to know if there is an alternative that would be less expensive but still effective.   Virginia Delgado would also like to know if she could have some Edarbi samples to hold her over for about a week or so as she only has a couple of pills left. Please call, thanks.

## 2016-04-28 ENCOUNTER — Telehealth: Payer: Self-pay | Admitting: *Deleted

## 2016-04-28 NOTE — Telephone Encounter (Signed)
Wrong patient open by mistake...Virginia Delgado

## 2016-04-28 NOTE — Telephone Encounter (Signed)
Would suggest she switch to irbesartan 300 mg daily.  Have her check home BP readings if possible for 2-3 weeks after switching.  Can set her up for f/u with [facility] if she has concerns about effectiveness or doesn't have home cuff.

## 2016-04-29 MED ORDER — IRBESARTAN 300 MG PO TABS: 300.0000 mg | ORAL_TABLET | Freq: Every day | ORAL | 5 refills | Status: DC

## 2016-04-29 NOTE — Telephone Encounter (Signed)
Pt notifed  rx sent to [facility] as requested

## 2016-05-19 DIAGNOSIS — N858 Other specified noninflammatory disorders of uterus: Secondary | ICD-10-CM | POA: Diagnosis not present

## 2016-05-19 DIAGNOSIS — N952 Postmenopausal atrophic vaginitis: Secondary | ICD-10-CM | POA: Diagnosis not present

## 2016-05-19 DIAGNOSIS — Z6826 Body mass index (BMI) 26.0-26.9, adult: Secondary | ICD-10-CM | POA: Diagnosis not present

## 2016-05-19 DIAGNOSIS — Z01419 Encounter for gynecological examination (general) (routine) without abnormal findings: Secondary | ICD-10-CM | POA: Diagnosis not present

## 2016-05-19 DIAGNOSIS — B1920 Unspecified viral hepatitis C without hepatic coma: Secondary | ICD-10-CM | POA: Diagnosis not present

## 2016-06-06 ENCOUNTER — Other Ambulatory Visit: Payer: Self-pay

## 2016-06-06 ENCOUNTER — Other Ambulatory Visit: Payer: Self-pay | Admitting: *Deleted

## 2016-06-06 DIAGNOSIS — N858 Other specified noninflammatory disorders of uterus: Secondary | ICD-10-CM | POA: Diagnosis not present

## 2016-06-06 DIAGNOSIS — D259 Leiomyoma of uterus, unspecified: Secondary | ICD-10-CM | POA: Diagnosis not present

## 2016-06-07 MED ORDER — IRBESARTAN 300 MG PO TABS: 300.0000 mg | ORAL_TABLET | Freq: Every day | ORAL | 0 refills | Status: DC

## 2016-07-19 NOTE — Progress Notes (Signed)
Cardiology Office Note   Date:  07/20/2016   ID:  Virginia Delgado, DOB May 16, 1945, MRN [id number]  PCP:  Glendale Chard, MD  Cardiologist:   Skeet Latch, MD   Chief Complaint  Patient presents with  . Follow-up    pt cramping in joints if sitting for long periods; denies other Sx.     History of Present Illness: Virginia Delgado is a 71 y.o. female with hypertension and Hepatitis C s/p treatment who presents for follow up. She was initially seen 06/2015 for  management of hypertension.   She was previously treated with HCTZ and metoprolol and her BP was well-controlled until 05/2015.  Virginia Delgado was seen in the ED on 4/27.  Her BP was in the 200s/110s.  She was started on amlodipine and received ativan.  Head CT was negative for stroke.  She followed up with her PCP, Dr. Glendale Chard, who started her on Edarbi and amlodipine.  She was seen in clinic 07/06/15, at which time metoprolol was switched to carvedilol.  Renal artery Doppler was negative for stenosis. She also had a exercise Cardiolite that was negative for ischemia.  She achieved 8.5 METS.  At her last appointment carvedilol was stopped due to fatigue and amlodipine was increased.  She followed up with our pharmacists and her blood pressure was well-controlled.   Virginia Delgado has been doing well. She notes that her blood pressure continues to be well-controlled. 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 She should be home more now and looks 4 to starting back her exercise routine. Her only complaint is pain in her right buttock radiating into her right leg. She is worried that this may mean she needs a hip replacement. She denies lower extremity edema, orthopnea, or PND. She has no chest pain or shortness of breath with exertion.  She exercises by going to the gym walking, running, and strength and conditioning.  She has been following and mostly plant-based diet is 4 to having her  cholesterol checked with Dr. Baird Cancer next month.   Past Medical History:  Diagnosis Date  . Dermatitis   . Hx of hepatitis C   . Hypertension     Past Surgical History:  Procedure Laterality Date  . CESAREAN SECTION    . UMBILICAL HERNIA REPAIR       Current Outpatient Prescriptions  Medication Sig Dispense Refill  . amLODipine (NORVASC) 10 MG tablet Take 1 tablet (10 mg total) by mouth daily. 30 tablet 5  . aspirin 81 MG tablet Take 81 mg by mouth daily.    . calcium carbonate (OSCAL) 1500 (600 Ca) MG TABS tablet Take 600 mg of elemental calcium by mouth daily.    . cholecalciferol (VITAMIN D) 1000 units tablet Take 1,000 Units by mouth daily.    Marland Kitchen co-enzyme Q-10 50 MG capsule Take 50 mg by mouth daily.    . hydrochlorothiazide (HYDRODIURIL) 25 MG tablet Take 25 mg by mouth daily.    . irbesartan (AVAPRO) 300 MG tablet Take 1 tablet (300 mg total) by mouth daily. 90 tablet 0  . magnesium 30 MG tablet Take 30 mg by mouth 2 (two) times daily.    . NONI, MORINDA CITRIFOLIA, PO Take 1 capsule by mouth daily.    . Omega-3 Fatty Acids (FISH OIL) 1200 MG CAPS Take 1 capsule by mouth daily.     No current facility-administered medications for this visit.  Allergies:   Contrast media [iodinated diagnostic agents]; Gadolinium derivatives; Lidocaine; and Latex    Social History:  The patient  reports that she has never smoked. She has never used smokeless tobacco. She reports that she drinks alcohol. She reports that she does not use drugs.   Family History:  The patient's family history includes Cancer in her mother and sister; Heart disease in her father; Hypertension in her brother and sister; Ovarian cancer in her maternal grandmother.    ROS:  Please see the history of present illness.   Otherwise, review of systems are positive for weight gain.   All other systems are reviewed and negative.    PHYSICAL EXAM: VS:  BP 114/68 (BP Location: Left Arm, Patient Position: Sitting,  Cuff Size: Normal)   Pulse (!) 59   Ht 5' 0.25" (1.53 m)   Wt 64.2 kg (141 lb 9.6 oz)   BMI 27.43 kg/m  , BMI Body mass index is 27.43 kg/m. GENERAL:  Well appearing.  No acute distress HEENT:  Pupils equal round and reactive, fundi not visualized, oral mucosa unremarkable NECK:  No jugular venous distention, waveform within normal limits, carotid upstroke brisk and symmetric, no bruits LUNGS:  Clear to auscultation bilaterally.  No crackles, wheezes or rhonchi HEART:  RRR.  PMI not displaced or sustained,S1 and S2 within normal limits, no S3, no S4, no clicks, no rubs, no murmurs ABD:  Flat, positive bowel sounds normal in frequency in pitch, no bruits, no rebound, no guarding, no midline pulsatile mass, no hepatomegaly, no splenomegaly EXT:  2 plus pulses throughout, no edema, no cyanosis no clubbing SKIN:  No rashes no nodules NEURO:  Cranial nerves II through XII grossly intact, motor grossly intact throughout PSYCH:  Cognitively intact, oriented to person place and time   EKG:  EKG is ordered today. The ekg ordered 06/24/15 demonstrates sinus rhythm rate 80 bpm. 07/20/16: Sinus bradycardia.  Rate 59 bpm.    Renal artery ultrasound 07/09/15: Normal renal arteries  Lexiscan Cardiolite 07/17/15:  Nuclear stress EF: 64%.  The left ventricular ejection fraction is normal (55-65%).  Blood pressure demonstrated a hypertensive response to exercise.  ST segment depression was noted during stress in the II, III, aVF, V5 and V6 leads.  The study is normal.   Recent Labs: No results found for requested labs within last 8760 hours.   05/20/15: Hemoglobin A1c 5.7% Total cholesterol 238, tri 96, HDL 80, LDL 139 AST 19 ALT 14  Lipid Panel No results found for: CHOL, TRIG, HDL, CHOLHDL, VLDL, LDLCALC, LDLDIRECT    Wt Readings from Last 3 Encounters:  07/20/16 64.2 kg (141 lb 9.6 oz)  09/24/15 63.5 kg (140 lb)  08/27/15 63.5 kg (140 lb)      ASSESSMENT AND PLAN:  #  Hypertension: Virginia Delgado BP is much better controlled.  Continue amlodipine, HCTZ and irbesartan.   # Chest pain: Stress test was negative for ischemia.  No recurrent chest pain.    # CV Disease Prevention: ASCVD 10 year risk is 10%.  She is not interested in starting a statin Prefers to manage her cholesterol with diet and exercise. She has switched to a mostly plant-based diet. I'm curious to see what her lipids will be when she hasn't checked with her PCP next month.    Current medicines are reviewed at length with the patient today.  The patient does not have concerns regarding medicines.  The following changes have been made: none  Labs/ tests ordered  today include:   Orders Placed This Encounter  Procedures  . EKG 12-Lead     Disposition:   FU with Marv Alfrey C. Oval Linsey, MD, [facility] in 1 year.  This note was written with the assistance of speech recognition software.  Please excuse any transcriptional errors.  Signed, Keilan Nichol C. Oval Linsey, MD, [facility]  07/20/2016 10:30 AM    [place]

## 2016-07-20 ENCOUNTER — Ambulatory Visit (INDEPENDENT_AMBULATORY_CARE_PROVIDER_SITE_OTHER): Payer: Medicare HMO | Admitting: Cardiovascular Disease

## 2016-07-20 ENCOUNTER — Encounter: Payer: Self-pay | Admitting: Cardiovascular Disease

## 2016-07-20 VITALS — BP 114/68 | HR 59 | Ht 60.25 in | Wt 141.6 lb

## 2016-07-20 DIAGNOSIS — I10 Essential (primary) hypertension: Secondary | ICD-10-CM

## 2016-07-20 DIAGNOSIS — E7800 Pure hypercholesterolemia, unspecified: Secondary | ICD-10-CM | POA: Diagnosis not present

## 2016-07-20 NOTE — Patient Instructions (Signed)
Medication Instructions:  Your physician recommends that you continue on your current medications as directed. Please refer to the Current Medication list given to you today.  Follow-Up: Your physician wants you to follow-up in: 1 year with Dr. [place]. You will receive a reminder letter in the mail two months in advance. If you don't receive a letter, please call our office to schedule the follow-up appointment.       If you need a refill on your cardiac medications before your next appointment, please call your pharmacy.   

## 2016-07-26 DIAGNOSIS — Z0000 Encounter for general adult medical examination without abnormal findings: Secondary | ICD-10-CM | POA: Diagnosis not present

## 2016-07-26 DIAGNOSIS — E559 Vitamin D deficiency, unspecified: Secondary | ICD-10-CM | POA: Diagnosis not present

## 2016-07-26 DIAGNOSIS — N182 Chronic kidney disease, stage 2 (mild): Secondary | ICD-10-CM | POA: Diagnosis not present

## 2016-07-26 DIAGNOSIS — G5711 Meralgia paresthetica, right lower limb: Secondary | ICD-10-CM | POA: Diagnosis not present

## 2016-07-26 DIAGNOSIS — R7309 Other abnormal glucose: Secondary | ICD-10-CM | POA: Diagnosis not present

## 2016-07-26 DIAGNOSIS — I129 Hypertensive chronic kidney disease with stage 1 through stage 4 chronic kidney disease, or unspecified chronic kidney disease: Secondary | ICD-10-CM | POA: Diagnosis not present

## 2016-09-06 ENCOUNTER — Ambulatory Visit
Admission: RE | Admit: 2016-09-06 | Discharge: 2016-09-06 | Disposition: A | Discharge status: Home / Self Care | Payer: Medicare HMO | Source: Ambulatory Visit | Attending: Internal Medicine | Admitting: Internal Medicine

## 2016-09-06 ENCOUNTER — Other Ambulatory Visit: Payer: Self-pay | Admitting: Internal Medicine

## 2016-09-06 DIAGNOSIS — M549 Dorsalgia, unspecified: Secondary | ICD-10-CM

## 2016-09-06 DIAGNOSIS — M47816 Spondylosis without myelopathy or radiculopathy, lumbar region: Secondary | ICD-10-CM | POA: Diagnosis not present

## 2016-10-13 DIAGNOSIS — L219 Seborrheic dermatitis, unspecified: Secondary | ICD-10-CM | POA: Diagnosis not present

## 2016-10-13 DIAGNOSIS — L819 Disorder of pigmentation, unspecified: Secondary | ICD-10-CM | POA: Diagnosis not present

## 2016-12-26 DIAGNOSIS — M4316 Spondylolisthesis, lumbar region: Secondary | ICD-10-CM | POA: Diagnosis not present

## 2017-01-04 DIAGNOSIS — M4316 Spondylolisthesis, lumbar region: Secondary | ICD-10-CM | POA: Diagnosis not present

## 2017-01-11 DIAGNOSIS — M4316 Spondylolisthesis, lumbar region: Secondary | ICD-10-CM | POA: Diagnosis not present

## 2017-01-18 DIAGNOSIS — M4316 Spondylolisthesis, lumbar region: Secondary | ICD-10-CM | POA: Diagnosis not present

## 2017-01-23 DIAGNOSIS — Z1231 Encounter for screening mammogram for malignant neoplasm of breast: Secondary | ICD-10-CM | POA: Diagnosis not present

## 2017-01-23 DIAGNOSIS — Z803 Family history of malignant neoplasm of breast: Secondary | ICD-10-CM | POA: Diagnosis not present

## 2017-01-28 DIAGNOSIS — Z6827 Body mass index (BMI) 27.0-27.9, adult: Secondary | ICD-10-CM | POA: Diagnosis not present

## 2017-01-28 DIAGNOSIS — I129 Hypertensive chronic kidney disease with stage 1 through stage 4 chronic kidney disease, or unspecified chronic kidney disease: Secondary | ICD-10-CM | POA: Diagnosis not present

## 2017-01-28 DIAGNOSIS — E785 Hyperlipidemia, unspecified: Secondary | ICD-10-CM | POA: Diagnosis not present

## 2017-01-28 DIAGNOSIS — N182 Chronic kidney disease, stage 2 (mild): Secondary | ICD-10-CM | POA: Diagnosis not present

## 2017-02-01 DIAGNOSIS — M4316 Spondylolisthesis, lumbar region: Secondary | ICD-10-CM | POA: Diagnosis not present

## 2017-02-13 DIAGNOSIS — M4316 Spondylolisthesis, lumbar region: Secondary | ICD-10-CM | POA: Diagnosis not present

## 2017-02-23 DIAGNOSIS — M4316 Spondylolisthesis, lumbar region: Secondary | ICD-10-CM | POA: Diagnosis not present

## 2017-02-24 DIAGNOSIS — M4316 Spondylolisthesis, lumbar region: Secondary | ICD-10-CM | POA: Diagnosis not present

## 2017-03-03 DIAGNOSIS — M545 Low back pain: Secondary | ICD-10-CM | POA: Diagnosis not present

## 2017-03-15 ENCOUNTER — Telehealth: Payer: Self-pay | Admitting: *Deleted

## 2017-03-15 DIAGNOSIS — E785 Hyperlipidemia, unspecified: Secondary | ICD-10-CM

## 2017-03-15 DIAGNOSIS — Z5181 Encounter for therapeutic drug level monitoring: Secondary | ICD-10-CM

## 2017-03-15 MED ORDER — ROSUVASTATIN CALCIUM 10 MG PO TABS: 10.0000 mg | ORAL_TABLET | Freq: Every day | ORAL | 5 refills | Status: DC

## 2017-03-15 NOTE — Telephone Encounter (Signed)
Advised patient of lab results, rx sent to [facility], and mailed lab orders

## 2017-03-15 NOTE — Telephone Encounter (Signed)
-----   Message from Skeet Latch, MD sent at 03/13/2017 10:21 AM EST ----- Cholesterol levels are too high given her 10 year risk of heart attack or stroke of 10%.  Recommend low dose statin, as her numbers are essentially unchanged from 6 months ago.  Would try rosuvastatin 10mg  daily.  Repeat lipids and CMP in 6-8 weeks if she is willing to try it.

## 2017-03-21 DIAGNOSIS — M25561 Pain in right knee: Secondary | ICD-10-CM | POA: Diagnosis not present

## 2017-03-23 DIAGNOSIS — M5136 Other intervertebral disc degeneration, lumbar region: Secondary | ICD-10-CM | POA: Diagnosis not present

## 2017-03-31 DIAGNOSIS — Z7982 Long term (current) use of aspirin: Secondary | ICD-10-CM | POA: Diagnosis not present

## 2017-03-31 DIAGNOSIS — Z91041 Radiographic dye allergy status: Secondary | ICD-10-CM | POA: Diagnosis not present

## 2017-03-31 DIAGNOSIS — Z87892 Personal history of anaphylaxis: Secondary | ICD-10-CM | POA: Diagnosis not present

## 2017-03-31 DIAGNOSIS — Z809 Family history of malignant neoplasm, unspecified: Secondary | ICD-10-CM | POA: Diagnosis not present

## 2017-03-31 DIAGNOSIS — Z803 Family history of malignant neoplasm of breast: Secondary | ICD-10-CM | POA: Diagnosis not present

## 2017-03-31 DIAGNOSIS — Z823 Family history of stroke: Secondary | ICD-10-CM | POA: Diagnosis not present

## 2017-03-31 DIAGNOSIS — Z8249 Family history of ischemic heart disease and other diseases of the circulatory system: Secondary | ICD-10-CM | POA: Diagnosis not present

## 2017-03-31 DIAGNOSIS — I10 Essential (primary) hypertension: Secondary | ICD-10-CM | POA: Diagnosis not present

## 2017-03-31 DIAGNOSIS — Z825 Family history of asthma and other chronic lower respiratory diseases: Secondary | ICD-10-CM | POA: Diagnosis not present

## 2017-04-07 DIAGNOSIS — E785 Hyperlipidemia, unspecified: Secondary | ICD-10-CM | POA: Diagnosis not present

## 2017-04-07 DIAGNOSIS — Z5181 Encounter for therapeutic drug level monitoring: Secondary | ICD-10-CM | POA: Diagnosis not present

## 2017-04-07 DIAGNOSIS — M5136 Other intervertebral disc degeneration, lumbar region: Secondary | ICD-10-CM | POA: Diagnosis not present

## 2017-04-08 LAB — COMPREHENSIVE METABOLIC PANEL
ALBUMIN: 4.6 g/dL (ref 3.6–4.8)
ALT: 12 IU/L (ref 0–32)
AST: 19 IU/L (ref 0–40)
Albumin/Globulin Ratio: 1.7 (ref 1.2–2.2)
Alkaline Phosphatase: 75 IU/L (ref 39–117)
BILIRUBIN TOTAL: 0.8 mg/dL (ref 0.0–1.2)
BUN / CREAT RATIO: 11 — AB (ref 12–28)
BUN: 12 mg/dL (ref 8–27)
CALCIUM: 10.2 mg/dL (ref 8.7–10.3)
CHLORIDE: 103 mmol/L (ref 96–106)
CO2: 23 mmol/L (ref 20–29)
Creatinine, Ser: 1.05 mg/dL — ABNORMAL HIGH (ref 0.57–1.00)
GFR, EST AFRICAN AMERICAN: 63 mL/min/{1.73_m2} (ref >59)
GFR, EST NON AFRICAN AMERICAN: 54 mL/min/{1.73_m2} — AB (ref >59)
GLUCOSE: 81 mg/dL (ref 65–99)
Globulin, Total: 2.7 g/dL (ref 1.5–4.5)
Potassium: 4.1 mmol/L (ref 3.5–5.2)
Sodium: 143 mmol/L (ref 134–144)
TOTAL PROTEIN: 7.3 g/dL (ref 6.0–8.5)

## 2017-04-08 LAB — LIPID PANEL
CHOLESTEROL TOTAL: 238 mg/dL — AB (ref 100–199)
Chol/HDL Ratio: 3.1 ratio (ref 0.0–4.4)
HDL: 76 mg/dL (ref >39)
LDL CALC: 135 mg/dL — AB (ref 0–99)
Triglycerides: 137 mg/dL (ref 0–149)
VLDL Cholesterol Cal: 27 mg/dL (ref 5–40)

## 2017-05-26 DIAGNOSIS — Z6826 Body mass index (BMI) 26.0-26.9, adult: Secondary | ICD-10-CM | POA: Diagnosis not present

## 2017-05-26 DIAGNOSIS — Z01419 Encounter for gynecological examination (general) (routine) without abnormal findings: Secondary | ICD-10-CM | POA: Diagnosis not present

## 2017-05-26 DIAGNOSIS — D259 Leiomyoma of uterus, unspecified: Secondary | ICD-10-CM | POA: Diagnosis not present

## 2017-07-28 ENCOUNTER — Ambulatory Visit: Payer: Medicare HMO | Admitting: Cardiovascular Disease

## 2017-09-06 DIAGNOSIS — I10 Essential (primary) hypertension: Secondary | ICD-10-CM | POA: Diagnosis not present

## 2017-09-06 DIAGNOSIS — E559 Vitamin D deficiency, unspecified: Secondary | ICD-10-CM | POA: Diagnosis not present

## 2017-09-06 DIAGNOSIS — E785 Hyperlipidemia, unspecified: Secondary | ICD-10-CM | POA: Diagnosis not present

## 2017-09-06 DIAGNOSIS — H9201 Otalgia, right ear: Secondary | ICD-10-CM | POA: Diagnosis not present

## 2017-09-20 ENCOUNTER — Encounter: Payer: Self-pay | Admitting: Cardiovascular Disease

## 2017-09-20 ENCOUNTER — Ambulatory Visit: Payer: Medicare HMO | Admitting: Cardiovascular Disease

## 2017-09-20 VITALS — BP 140/84 | HR 66 | Ht 60.0 in | Wt 140.6 lb

## 2017-09-20 DIAGNOSIS — E7800 Pure hypercholesterolemia, unspecified: Secondary | ICD-10-CM | POA: Diagnosis not present

## 2017-09-20 DIAGNOSIS — I10 Essential (primary) hypertension: Secondary | ICD-10-CM | POA: Diagnosis not present

## 2017-09-20 MED ORDER — LOSARTAN POTASSIUM-HCTZ 50-12.5 MG PO TABS: 1.0000 | ORAL_TABLET | Freq: Every day | ORAL | 1 refills | Status: DC

## 2017-09-20 NOTE — Progress Notes (Addendum)
Cardiology Office Note   Date:  09/20/2017   ID:  Virginia Delgado, DOB September 08, 1945, MRN [id number]  PCP:  Virginia Chard, MD  Cardiologist:   Virginia Latch, MD   Chief Complaint  Patient presents with  . Follow-up     History of Present Illness: Virginia Delgado is a 72 y.o. female with hypertension and Hepatitis C s/p treatment who presents for follow up. She was initially seen 06/2015 for  management of hypertension.   She was previously treated with HCTZ and metoprolol and her BP was well-controlled until 05/2015.  Virginia Delgado was seen in the ED on 4/27.  Her BP was in the 200s/110s.  She was started on amlodipine and received ativan.  Head CT was negative for stroke.  She followed up with her PCP, Dr. Glendale Delgado, who started her on Edarbi and amlodipine.  She was seen in clinic 07/06/15, at which time metoprolol was switched to carvedilol.  Renal artery Doppler was negative for stenosis. She also had a exercise Cardiolite that was negative for ischemia.  She achieved 8.5 METS. Carvedilol was stopped due to fatigue and amlodipine was increased.    Since her last appointment Virginia Delgado notes that her BP has been labile.  She thinks that her body is immune to amlodipine.  She saw Virginia Delgado and was switched from amlodipine to [facility].  Her blood pressure has been mostly in the 140s over 80s since that time.  She started back exercising 3 to 5 days/week.  She walks for 20 minutes and then does weights.  She has no exertional chest pain or shortness of breath.  She occasionally has lower extremity edema after long flights cross-country.  She denies orthopnea or PND.  She also complains of occasional shooting pain in her right hip and had to have it injected.  She has not had any chest pain or pressure.  She denies shortness of breath.  Past Medical History:  Diagnosis Date  . Dermatitis   . Hx of hepatitis C   . Hypertension     Past Surgical History:  Procedure Laterality Date  . CESAREAN  SECTION    . UMBILICAL HERNIA REPAIR       Current Outpatient Medications  Medication Sig Dispense Refill  . aspirin 81 MG tablet Take 81 mg by mouth daily.    . calcium carbonate (OSCAL) 1500 (600 Ca) MG TABS tablet Take 600 mg of elemental calcium by mouth daily.    . cholecalciferol (VITAMIN D) 1000 units tablet Take 1,000 Units by mouth daily.    Marland Kitchen co-enzyme Q-10 50 MG capsule Take 50 mg by mouth daily.    . magnesium 30 MG tablet Take 30 mg by mouth 2 (two) times daily.    . NONI, MORINDA CITRIFOLIA, PO Take 1 capsule by mouth daily.    . Omega-3 Fatty Acids (FISH OIL) 1200 MG CAPS Take 1 capsule by mouth daily.    Marland Kitchen losartan-hydrochlorothiazide (HYZAAR) 50-12.5 MG tablet Take 1 tablet by mouth daily. 90 tablet 1  . rosuvastatin (CRESTOR) 10 MG tablet Take 1 tablet (10 mg total) by mouth daily. 30 tablet 5   No current facility-administered medications for this visit.     Allergies:   Contrast media [iodinated diagnostic agents]; Gadolinium derivatives; Lidocaine; and Latex    Social History:  The patient  reports that she has never smoked. She has never used smokeless tobacco. She reports that she drinks alcohol. She reports that she does not use  drugs.   Family History:  The patient's family history includes Delgado in her mother and sister; Heart disease in her father; Hypertension in her brother and sister; Ovarian Delgado in her maternal grandmother.    ROS:  Please see the history of present illness.   Otherwise, review of systems are positive for weight gain.   All other systems are reviewed and negative.    PHYSICAL EXAM: VS:  BP 140/84   Pulse 66   Ht 5' (1.524 m)   Wt 140 lb 9.6 oz (63.8 kg)   BMI 27.46 kg/m  , BMI Body mass index is 27.46 kg/m. GENERAL:  Well appearing HEENT: Pupils equal round and reactive, fundi not visualized, oral mucosa unremarkable NECK:  No jugular venous distention, waveform within normal limits, carotid upstroke brisk and symmetric, no  bruits LUNGS:  Clear to auscultation bilaterally HEART:  RRR.  PMI not displaced or sustained,S1 and S2 within normal limits, no S3, no S4, no clicks, no rubs, no murmurs ABD:  Flat, positive bowel sounds normal in frequency in pitch, no bruits, no rebound, no guarding, no midline pulsatile mass, no hepatomegaly, no splenomegaly EXT:  2 plus pulses throughout, no edema, no cyanosis no clubbing SKIN:  No rashes no nodules NEURO:  Cranial nerves II through XII grossly intact, motor grossly intact throughout PSYCH:  Cognitively intact, oriented to person place and time   EKG:  EKG is not ordered today. The ekg ordered 06/24/15 demonstrates sinus rhythm rate 80 bpm. 07/20/16: Sinus bradycardia.  Rate 59 bpm.   09/20/17: Sinus rhythm.  Rate 66 bpm.    Renal artery ultrasound 07/09/15: Normal renal arteries  Lexiscan Cardiolite 07/17/15:  Nuclear stress EF: 64%.  The left ventricular ejection fraction is normal (55-65%).  Blood pressure demonstrated a hypertensive response to exercise.  ST segment depression was noted during stress in the II, III, aVF, V5 and V6 leads.  The study is normal.   Recent Labs: 04/07/2017: ALT 12; BUN 12; Creatinine, Ser 1.05; Potassium 4.1; Sodium 143   05/20/15: Hemoglobin A1c 5.7% Total cholesterol 238, tri 96, HDL 80, LDL 139 AST 19 ALT 14  09/06/17:  Total cholesterol 254, triglycerides 75, HDL 85, LDL 154 Hemoglobin 13.6 Creatinine 1.04 Potassium 3.9  Lipid Panel    Component Value Date/Time   CHOL 238 (H) 04/07/2017 1433   TRIG 137 04/07/2017 1433   HDL 76 04/07/2017 1433   CHOLHDL 3.1 04/07/2017 1433   LDLCALC 135 (H) 04/07/2017 1433      Wt Readings from Last 3 Encounters:  09/20/17 140 lb 9.6 oz (63.8 kg)  07/20/16 141 lb 9.6 oz (64.2 kg)  09/24/15 140 lb (63.5 kg)      ASSESSMENT AND PLAN:  # Hypertension: Virginia Delgado BP is above goal.  We will switch Edarbi to HCTZ/Losartan 12.5/50mg  daily.  She will track her BP and follow up  in 1 month.  # Chest pain: Stress test was negative for ischemia.  No recurrent chest pain.     # CV Disease Prevention: ASCVD 10 year risk is 10%.  She remains uninterested in statins.  Her diet is healthy and she is exercising.    Current medicines are reviewed at length with the patient today.  The patient does not have concerns regarding medicines.  The following changes have been made: Stop Scientist, water quality.  Start HCTZ/Losartan.  Labs/ tests ordered today include:   Orders Placed This Encounter  Procedures  . EKG 12-Lead     Disposition:  FU with Terris Bodin C. Oval Linsey, MD, [facility] in 3 months.  PharmD in 1 month.     Signed, Libi Corso C. Oval Linsey, MD, [facility]  09/20/2017 12:46 PM    [place]

## 2017-09-20 NOTE — Patient Instructions (Addendum)
Medication Instructions:  STOP EDARBYCLOR   START LOSARTAN HCT 50-12.5 MG DAILY   Labwork: NONE  Testing/Procedures: NONE  Follow-Up: Your physician recommends that you schedule a follow-up appointment in: [place] [facility] FOR BLOOD PRESSURE  Your physician recommends that you schedule a follow-up appointment in: [place] [address] [facility]   If you need a refill on your cardiac medications before your next appointment, please call your pharmacy.

## 2017-10-11 DIAGNOSIS — Z1211 Encounter for screening for malignant neoplasm of colon: Secondary | ICD-10-CM | POA: Diagnosis not present

## 2017-10-11 DIAGNOSIS — B182 Chronic viral hepatitis C: Secondary | ICD-10-CM | POA: Diagnosis not present

## 2017-10-11 DIAGNOSIS — I10 Essential (primary) hypertension: Secondary | ICD-10-CM | POA: Diagnosis not present

## 2017-10-19 ENCOUNTER — Ambulatory Visit (INDEPENDENT_AMBULATORY_CARE_PROVIDER_SITE_OTHER): Payer: Medicare HMO | Admitting: Pharmacist Clinician (PhC)/ Clinical Pharmacy Specialist

## 2017-10-19 DIAGNOSIS — I10 Essential (primary) hypertension: Secondary | ICD-10-CM

## 2017-10-19 DIAGNOSIS — E785 Hyperlipidemia, unspecified: Secondary | ICD-10-CM | POA: Diagnosis not present

## 2017-10-19 NOTE — Patient Instructions (Addendum)
Return for a a follow up appointment in 1 month  Your blood pressure today is 142/70    Check your blood pressure at home daily and keep record of the readings.  Take your BP meds as follows:  Restart amlodipine 5 mg once daily in the evenings   Start rosuvastatin 5 mg (1/2 tablet) once weekly.  If you do well after 4-5 weeks try taking twice weekly  Bring all of your meds, your BP cuff and your record of home blood pressures to your next appointment.  Exercise as you're able, try to walk approximately 30 minutes per day.  Keep salt intake to a minimum, especially watch canned and prepared boxed foods.  Eat more fresh fruits and vegetables and fewer canned items.  Avoid eating in fast food restaurants.    HOW TO TAKE YOUR BLOOD PRESSURE: . Rest 5 minutes before taking your blood pressure. .  Don't smoke or drink caffeinated beverages for at least 30 minutes before. . Take your blood pressure before (not after) you eat. . Sit comfortably with your back supported and both feet on the floor (don't cross your legs). . Elevate your arm to heart level on a table or a desk. . Use the proper sized cuff. It should fit smoothly and snugly around your bare upper arm. There should be enough room to slip a fingertip under the cuff. The bottom edge of the cuff should be 1 inch above the crease of the elbow. . Ideally, take 3 measurements at one sitting and record the average.

## 2017-10-19 NOTE — Progress Notes (Signed)
10/20/2017 Virginia Delgado 04/30/1945 [id number]   HPI:  Virginia Delgado is a 72 y.o. female patient of Dr Oval Linsey, with a [place] below who presents today for hypertension clinic evaluation.  In addition to hypertension her medical history is significant for hepatitis C and hyperlipidemia.  Her hypertension was well controlled for the past couple of years on Edarbychlor and amlodipine, however has been "creeping up" again.    At her last visit with Dr. Oval Linsey the [place] was discontinued due to cost and she was started on losartan/hctz 50/12.5 mg.  The amlodipine was discontinued, as the patient felt she was immune to it.  She has had no side effects with the losartan hctz.    Today she also notes concern about her cholesterol labs drawn by PCP in July.  Especially worried about LDL, as father died from MI at age 35.  She was previously on rosuvastatin 10 mg daily, but had to stop due to muscle aches in her legs and feet.    Blood Pressure Goal:  130/80  Current Medications:  Losartan hct 50/12.5  Family Hx:  falther died from MI at 73  Mother living at 30 breast cancer survivor;   1 sister passed at 10 from sarcoma  Brother  - 1 of 2 with hypertension  Social Hx:  No alcohol; no tobacco; no caffeine  Diet:  Mostly home cooked meals, mostly plant based diet; occasional seafood.  No pork, red meat; little chicken; eats essentially a pescatarian diet, with occasional chicken.    Exercise:  Walk 30-40 min, treadmill 20 min then weights - three times per week  (1 hour)  Home BP readings:  476-546'T systolic at home 03-54 diastolic  Intolerances:   No cardiac medication intolerances Labs:  08/2017:  TC 254, TG 75, HDL 85, LDL 154      Na 143, K 3.9, Glu 92, BUN 21, SCr 1.04 (GFR-AA 63) Wt Readings from Last 3 Encounters:  09/20/17 140 lb 9.6 oz (63.8 kg)  07/20/16 141 lb 9.6 oz (64.2 kg)  09/24/15 140 lb (63.5 kg)   BP Readings from Last 3 Encounters:  10/19/17 (!) 142/70    09/20/17 140/84  07/20/16 114/68   Pulse Readings from Last 3 Encounters:  10/19/17 68  09/20/17 66  07/20/16 (!) 59    Current Outpatient Medications  Medication Sig Dispense Refill  . aspirin 81 MG tablet Take 81 mg by mouth daily.    . calcium carbonate (OSCAL) 1500 (600 Ca) MG TABS tablet Take 600 mg of elemental calcium by mouth daily.    . cholecalciferol (VITAMIN D) 1000 units tablet Take 1,000 Units by mouth daily.    Marland Kitchen co-enzyme Q-10 50 MG capsule Take 50 mg by mouth daily.    Marland Kitchen losartan-hydrochlorothiazide (HYZAAR) 50-12.5 MG tablet Take 1 tablet by mouth daily. 90 tablet 1  . magnesium 30 MG tablet Take 30 mg by mouth 2 (two) times daily.    . NONI, MORINDA CITRIFOLIA, PO Take 1 capsule by mouth daily.    . Omega-3 Fatty Acids (FISH OIL) 1200 MG CAPS Take 1 capsule by mouth daily.    . rosuvastatin (CRESTOR) 10 MG tablet Take 1 tablet (10 mg total) by mouth daily. 30 tablet 5   No current facility-administered medications for this visit.     Allergies  Allergen Reactions  . Contrast Media [Iodinated Diagnostic Agents] Swelling    Of face, especially eyes  . Gadolinium Derivatives Other (See Comments)  Felt extremities go numb briefly as contrast injected.  Had cough (bronchospasm?) briefly after scan.  Resolved on own without treatment.  07/13/15  . Lidocaine Other (See Comments)    Facial swelling after dental procedure  . Latex Rash and Other (See Comments)    and irritation    Past Medical History:  Diagnosis Date  . Dermatitis   . Hx of hepatitis C   . Hypertension     Blood pressure (!) 142/70, pulse 68.  Hyperlipidemia LDL goal <100 Patient developed muscle aches on rosuvastatin 10 mg daily, however due to family history feels important to take something to treat.  Since she still has rosuvastatin at home, will have her cut the tablets in half and just take 1/2 tab once weekly.  In 4-5 weeks if she is tolerating this well will increase to twice weekly  and then taper as tolerated.    Hypertension Her blood pressure goal is 130/80 just by ASCVD risk of >10%.  (hers is 17.2%).  Explained that often people will need 3 or more medications to treat hypertension, so she may not have been "immune" to amlodipine, it just wasn't enough.  She still has some tablets at home, so have asked her to start back on 5 mg daily in the evenings and continue the losartan hctz 50/12.5 in the mornings.  Will see her back in one month for follow up.     Tommy Medal PharmD CPP [facility] [address] [place], [place] [address] [phone]

## 2017-10-20 ENCOUNTER — Encounter: Payer: Self-pay | Admitting: Pharmacist Clinician (PhC)/ Clinical Pharmacy Specialist

## 2017-10-20 DIAGNOSIS — E785 Hyperlipidemia, unspecified: Secondary | ICD-10-CM | POA: Insufficient documentation

## 2017-10-20 NOTE — Assessment & Plan Note (Signed)
Her blood pressure goal is 130/80 just by ASCVD risk of >10%.  (hers is 17.2%).  Explained that often people will need 3 or more medications to treat hypertension, so she may not have been "immune" to amlodipine, it just wasn't enough.  She still has some tablets at home, so have asked her to start back on 5 mg daily in the evenings and continue the losartan hctz 50/12.5 in the mornings.  Will see her back in one month for follow up.

## 2017-10-20 NOTE — Assessment & Plan Note (Addendum)
Patient developed muscle aches on rosuvastatin 10 mg daily, however due to family history feels important to take something to treat.  Since she still has rosuvastatin at home, will have her cut the tablets in half and just take 1/2 tab once weekly.  In 4-5 weeks if she is tolerating this well will increase to twice weekly and then taper as tolerated.

## 2017-10-23 ENCOUNTER — Ambulatory Visit: Payer: Medicare HMO

## 2017-11-02 DIAGNOSIS — K635 Polyp of colon: Secondary | ICD-10-CM | POA: Diagnosis not present

## 2017-11-02 DIAGNOSIS — K529 Noninfective gastroenteritis and colitis, unspecified: Secondary | ICD-10-CM | POA: Diagnosis not present

## 2017-11-02 DIAGNOSIS — K633 Ulcer of intestine: Secondary | ICD-10-CM | POA: Diagnosis not present

## 2017-11-02 DIAGNOSIS — Z1211 Encounter for screening for malignant neoplasm of colon: Secondary | ICD-10-CM | POA: Diagnosis not present

## 2017-11-23 ENCOUNTER — Ambulatory Visit: Payer: Medicare HMO | Admitting: Pharmacist Clinician (PhC)/ Clinical Pharmacy Specialist

## 2017-11-23 DIAGNOSIS — E785 Hyperlipidemia, unspecified: Secondary | ICD-10-CM | POA: Diagnosis not present

## 2017-11-23 DIAGNOSIS — I10 Essential (primary) hypertension: Secondary | ICD-10-CM | POA: Diagnosis not present

## 2017-11-23 MED ORDER — AMLODIPINE BESYLATE 5 MG PO TABS: 5.0000 mg | ORAL_TABLET | Freq: Every day | ORAL | 3 refills | Status: DC

## 2017-11-23 NOTE — Assessment & Plan Note (Signed)
Currently not ready to re-challenge with low dose statin.  She is trying bitter melon for now and will consider statin in the future.  Reviewed some dietary options to help with this.

## 2017-11-23 NOTE — Patient Instructions (Signed)
Return for a a follow up appointment with Dr. Oval Linsey in October   Your blood pressure today is 116/78  Check your blood pressure at home daily and keep record of the readings.  Take your BP meds as follows:  Continue with all your current medications  Bring all of your meds, your BP cuff and your record of home blood pressures to your next appointment.  Exercise as you're able, try to walk approximately 30 minutes per day.  Keep salt intake to a minimum, especially watch canned and prepared boxed foods.  Eat more fresh fruits and vegetables and fewer canned items.  Avoid eating in fast food restaurants.    HOW TO TAKE YOUR BLOOD PRESSURE: . Rest 5 minutes before taking your blood pressure. .  Don't smoke or drink caffeinated beverages for at least 30 minutes before. . Take your blood pressure before (not after) you eat. . Sit comfortably with your back supported and both feet on the floor (don't cross your legs). . Elevate your arm to heart level on a table or a desk. . Use the proper sized cuff. It should fit smoothly and snugly around your bare upper arm. There should be enough room to slip a fingertip under the cuff. The bottom edge of the cuff should be 1 inch above the crease of the elbow. . Ideally, take 3 measurements at one sitting and record the average.

## 2017-11-23 NOTE — Progress Notes (Signed)
11/23/2017 Valera Brashier 01-19-1946 [id number]   HPI:  Virginia Delgado is a 72 y.o. female patient of Dr Oval Linsey, with a [place] below who presents today for hypertension clinic evaluation.  In addition to hypertension her medical history is significant for hepatitis C and hyperlipidemia.  Her hypertension was well controlled for the past couple of years on Edarbychlor and amlodipine, however has been "creeping up" again.    At her last visit with Dr. Oval Linsey the [place] was discontinued due to cost and she was started on losartan/hctz 50/12.5 mg.  The amlodipine was discontinued, as the patient felt she was immune to it.  She was seen in [facility] after that and encouraged to restart amlodipine 5 mg.  Explained to patient that we often need 3-4 medications to control BP, so it's not that it was ineffective, she just needed it in combination with other medications.    She also had concerns about her cholesterol, as her father died from MI at age 44.  She was previously on rosuvastatin 10 mg daily, but had to stop due to muscle aches in her legs and feet.   She wanted to re-try the medication, so I had her start on 5 mg once weekly and try increasing dose after 4-5 weeks if tolerated.   Today she returns for a follow up.  She brings in a good number of home readings, with many in the 120-130 range.  She has no problems with her current medications.  She did not start with the low dose, weekly rosuvastatin, as she is looking for natural remedies and diet to improve her cholesterol.  She has started a bitter melon supplement.  Blood Pressure Goal:  130/80  Current Medications:  Losartan hct 50/12.5 qd  Amlodipine 5 mg qd  Family Hx:  falther died from MI at 1  Mother living at 75 breast cancer survivor;   1 sister passed at 31 from sarcoma  Brother  - 1 of 2 with hypertension  Social Hx:  No alcohol; no tobacco; no caffeine  Diet:  Mostly home cooked meals, mostly plant based diet; occasional  seafood.  No pork, red meat; little chicken; eats essentially a pescatarian diet, with occasional chicken.    Exercise:  Walk 30-40 min, treadmill 20 min then weights - three times per week  (1 hour)  Home BP readings:  Home cuff Microlife, about 30 months old.  Average of 38 readings 134/72, range 111-158/50-88  Intolerances:   No cardiac medication intolerances Labs:  08/2017:  TC 254, TG 75, HDL 85, LDL 154      Na 143, K 3.9, Glu 92, BUN 21, SCr 1.04 (GFR-AA 63) Wt Readings from Last 3 Encounters:  09/20/17 140 lb 9.6 oz (63.8 kg)  07/20/16 141 lb 9.6 oz (64.2 kg)  09/24/15 140 lb (63.5 kg)   BP Readings from Last 3 Encounters:  11/23/17 116/78  10/19/17 (!) 142/70  09/20/17 140/84   Pulse Readings from Last 3 Encounters:  11/23/17 64  10/19/17 68  09/20/17 66    Current Outpatient Medications  Medication Sig Dispense Refill  . amLODipine (NORVASC) 5 MG tablet Take 1 tablet (5 mg total) by mouth daily. 90 tablet 3  . aspirin 81 MG tablet Take 81 mg by mouth daily.    . calcium carbonate (OSCAL) 1500 (600 Ca) MG TABS tablet Take 600 mg of elemental calcium by mouth daily.    . cholecalciferol (VITAMIN D) 1000 units tablet Take 1,000 Units  by mouth daily.    Marland Kitchen co-enzyme Q-10 50 MG capsule Take 50 mg by mouth daily.    Marland Kitchen losartan-hydrochlorothiazide (HYZAAR) 50-12.5 MG tablet Take 1 tablet by mouth daily. 90 tablet 1  . magnesium 30 MG tablet Take 30 mg by mouth 2 (two) times daily.    . NONI, MORINDA CITRIFOLIA, PO Take 1 capsule by mouth daily.    . Omega-3 Fatty Acids (FISH OIL) 1200 MG CAPS Take 1 capsule by mouth daily.    . rosuvastatin (CRESTOR) 10 MG tablet Take 1 tablet (10 mg total) by mouth daily. 30 tablet 5   No current facility-administered medications for this visit.     Allergies  Allergen Reactions  . Contrast Media [Iodinated Diagnostic Agents] Swelling    Of face, especially eyes  . Gadolinium Derivatives Other (See Comments)    Felt extremities go  numb briefly as contrast injected.  Had cough (bronchospasm?) briefly after scan.  Resolved on own without treatment.  07/13/15  . Lidocaine Other (See Comments)    Facial swelling after dental procedure  . Latex Rash and Other (See Comments)    and irritation    Past Medical History:  Diagnosis Date  . Dermatitis   . Hx of hepatitis C   . Hypertension     Blood pressure 116/78, pulse 64.  Hypertension Blood pressure much improved with the addition of amlodipine to losartan hctz.  Reviewed the potential recall/drug shortage and explained that if needed we would switch her to valsartan hctz.  For now she is to continue with both medication and follow up with Dr. Oval Linsey next month.    Hyperlipidemia LDL goal <100 Currently not ready to re-challenge with low dose statin.  She is trying bitter melon for now and will consider statin in the future.  Reviewed some dietary options to help with this.     Tommy Medal PharmD CPP [facility] [address] [place], [place] [address] [phone]

## 2017-11-23 NOTE — Assessment & Plan Note (Signed)
Blood pressure much improved with the addition of amlodipine to losartan hctz.  Reviewed the potential recall/drug shortage and explained that if needed we would switch her to valsartan hctz.  For now she is to continue with both medication and follow up with Dr. Oval Linsey next month.

## 2017-12-21 ENCOUNTER — Ambulatory Visit: Payer: Medicare HMO | Admitting: Cardiovascular Disease

## 2018-01-19 ENCOUNTER — Ambulatory Visit (INDEPENDENT_AMBULATORY_CARE_PROVIDER_SITE_OTHER): Payer: Medicare HMO | Admitting: Nurse Practitioner

## 2018-01-19 ENCOUNTER — Encounter: Payer: Self-pay | Admitting: Nurse Practitioner

## 2018-01-19 VITALS — BP 116/78 | HR 68 | Temp 98.4°F | Ht 60.0 in | Wt 141.6 lb

## 2018-01-19 DIAGNOSIS — R05 Cough: Secondary | ICD-10-CM

## 2018-01-19 DIAGNOSIS — R059 Cough, unspecified: Secondary | ICD-10-CM

## 2018-01-19 MED ORDER — HYDROCODONE-HOMATROPINE 5-1.5 MG/5ML PO SYRP: 5.0000 mL | ORAL_SOLUTION | Freq: Four times a day (QID) | ORAL | 0 refills | Status: DC | PRN

## 2018-01-19 NOTE — Progress Notes (Signed)
Subjective:     Patient ID: Virginia Delgado , female    DOB: March 14, 1945 , 72 y.o.   MRN: [id number]   Chief Complaint  Patient presents with  . Cough    HPI  Cough  This is a new problem. The current episode started 1 to 4 weeks ago. The problem has been rapidly improving. Pertinent negatives include no chest pain, ear pain, nasal congestion, postnasal drip or shortness of breath. She has tried nothing for the symptoms. The treatment provided no relief. There is no history of asthma.     Past Medical History:  Diagnosis Date  . Dermatitis   . Hx of hepatitis C   . Hypertension      Family History  Problem Relation Age of Onset  . Cancer Mother        breast  . Heart disease Father   . Cancer Sister        sarcoma  . Hypertension Brother   . Ovarian cancer Maternal Grandmother   . Hypertension Sister      Current Outpatient Medications:  .  amLODipine (NORVASC) 5 MG tablet, Take 1 tablet (5 mg total) by mouth daily., Disp: 90 tablet, Rfl: 3 .  aspirin 81 MG tablet, Take 81 mg by mouth daily., Disp: , Rfl:  .  calcium carbonate (OSCAL) 1500 (600 Ca) MG TABS tablet, Take 600 mg of elemental calcium by mouth daily., Disp: , Rfl:  .  cholecalciferol (VITAMIN D) 1000 units tablet, Take 1,000 Units by mouth daily., Disp: , Rfl:  .  co-enzyme Q-10 50 MG capsule, Take 50 mg by mouth daily., Disp: , Rfl:  .  losartan-hydrochlorothiazide (HYZAAR) 50-12.5 MG tablet, Take 1 tablet by mouth daily., Disp: 90 tablet, Rfl: 1 .  magnesium 30 MG tablet, Take 30 mg by mouth 2 (two) times daily., Disp: , Rfl:  .  NONI, MORINDA CITRIFOLIA, PO, Take 1 capsule by mouth daily., Disp: , Rfl:  .  Omega-3 Fatty Acids (FISH OIL) 1200 MG CAPS, Take 1 capsule by mouth daily., Disp: , Rfl:    Allergies  Allergen Reactions  . Contrast Media [Iodinated Diagnostic Agents] Swelling    Of face, especially eyes  . Gadolinium Derivatives Other (See Comments)    Felt extremities go numb briefly as contrast  injected.  Had cough (bronchospasm?) briefly after scan.  Resolved on own without treatment.  07/13/15  . Lidocaine Other (See Comments)    Facial swelling after dental procedure  . Latex Rash and Other (See Comments)    and irritation     Review of Systems  Constitutional: Negative.   HENT: Negative for ear pain and postnasal drip.   Eyes: Negative.   Respiratory: Positive for cough. Negative for shortness of breath.   Cardiovascular: Negative.  Negative for chest pain.     Today's Vitals   01/19/18 0956  BP: 116/78  Pulse: 68  Temp: 98.4 F (36.9 C)  TempSrc: Oral  SpO2: 97%  Weight: 141 lb 9.6 oz (64.2 kg)  Height: 5' (1.524 m)   Body mass index is 27.65 kg/m.   Objective:  Physical Exam  Eyes: Pupils are equal, round, and reactive to light.  Neck: Normal range of motion.  Cardiovascular: Normal rate, regular rhythm and normal heart sounds.  Pulmonary/Chest: Effort normal and breath sounds normal.        Assessment And Plan:     1. Cough  persistant nagging cough  Will provide Hydromet - HYDROcodone-homatropine (HYDROMET) 5-1.5 MG/5ML  syrup; Take 5 mLs by mouth every 6 (six) hours as needed for cough.  Dispense: 120 mL; Refill: 0      Minette Brine, FNP

## 2018-01-24 ENCOUNTER — Encounter

## 2018-01-24 ENCOUNTER — Ambulatory Visit: Payer: Medicare HMO | Admitting: Cardiovascular Disease

## 2018-02-12 DIAGNOSIS — Z803 Family history of malignant neoplasm of breast: Secondary | ICD-10-CM | POA: Diagnosis not present

## 2018-02-12 DIAGNOSIS — Z1231 Encounter for screening mammogram for malignant neoplasm of breast: Secondary | ICD-10-CM | POA: Diagnosis not present

## 2018-02-22 ENCOUNTER — Ambulatory Visit: Payer: Medicare HMO | Admitting: Cardiovascular Disease

## 2018-03-07 DIAGNOSIS — R69 Illness, unspecified: Secondary | ICD-10-CM | POA: Diagnosis not present

## 2018-03-12 ENCOUNTER — Encounter: Payer: Self-pay | Admitting: Internal Medicine

## 2018-03-12 ENCOUNTER — Ambulatory Visit (INDEPENDENT_AMBULATORY_CARE_PROVIDER_SITE_OTHER): Payer: Medicare HMO | Admitting: Internal Medicine

## 2018-03-12 VITALS — BP 110/68 | HR 66 | Temp 98.1°F | Ht 61.5 in | Wt 144.2 lb

## 2018-03-12 DIAGNOSIS — Z7982 Long term (current) use of aspirin: Secondary | ICD-10-CM

## 2018-03-12 DIAGNOSIS — E2839 Other primary ovarian failure: Secondary | ICD-10-CM

## 2018-03-12 DIAGNOSIS — I1310 Hypertensive heart and chronic kidney disease without heart failure, with stage 1 through stage 4 chronic kidney disease, or unspecified chronic kidney disease: Secondary | ICD-10-CM | POA: Insufficient documentation

## 2018-03-12 DIAGNOSIS — I129 Hypertensive chronic kidney disease with stage 1 through stage 4 chronic kidney disease, or unspecified chronic kidney disease: Secondary | ICD-10-CM

## 2018-03-12 DIAGNOSIS — N182 Chronic kidney disease, stage 2 (mild): Secondary | ICD-10-CM

## 2018-03-12 DIAGNOSIS — E785 Hyperlipidemia, unspecified: Secondary | ICD-10-CM

## 2018-03-12 NOTE — Patient Instructions (Signed)
Chronic Kidney Disease, Adult  Chronic kidney disease (CKD) occurs when the kidneys become damaged slowly over a long period of time. The kidneys are a pair of organs that do many important jobs in the body, including:  · Removing waste and extra fluid from the blood to make urine.  · Making hormones that maintain the amount of fluid in tissues and blood vessels.  · Maintaining the right amount of fluids and chemicals in the body.  A small amount of kidney damage may not cause problems, but a large amount of damage may make it hard or impossible for the kidneys to work the way they should. If steps are not taken to slow down kidney damage or to stop it from getting worse, the kidneys may stop working permanently (end-stage renal disease or ESRD). Most of the time, CKD does not go away, but it can often be controlled. People who have CKD are usually able to live normal lives.  What are the causes?  The most common causes of this condition are diabetes and high blood pressure (hypertension). Other causes include:  · Heart and blood vessel (cardiovascular) disease.  · Kidney diseases, such as:  ? Glomerulonephritis.  ? Interstitial nephritis.  ? Polycystic kidney disease.  ? Renal vascular disease.  · Diseases that affect the immune system.  · Genetic diseases.  · Medicines that damage the kidneys, such as anti-inflammatory medicines.  · Being around or being in contact with poisonous (toxic) substances.  · A kidney or urinary infection that occurs again and again (recurs).  · Vasculitis. This is swelling or inflammation of the blood vessels.  · A problem with urine flow that may be caused by:  ? Cancer.  ? Having kidney stones more than one time.  ? An enlarged prostate, in males.  What increases the risk?  You are more likely to develop this condition if you:  · Are older than age 60.  · Are female.  · Are African-American, Hispanic, Asian, Pacific Islander, or American Indian.  · Are a current or former  smoker.  · Are obese.  · Have a family history of kidney disease or failure.  · Often take medicines that are damaging to the kidneys.  What are the signs or symptoms?  Symptoms of this condition include:  · Swelling (edema) of the face, legs, ankles, or feet.  · Tiredness (lethargy) and having less energy.  · Nausea or vomiting.  · Confusion or trouble concentrating.  · Problems with urination, such as:  ? Painful or burning feeling during urination.  ? Decreased urine production.  ? Frequent urination, especially at night.  ? Bloody urine.  · Muscle twitches and cramps, especially in the legs.  · Shortness of breath.  · Weakness.  · Loss of appetite.  · Metallic taste in the mouth.  · Trouble sleeping.  · Dry, itchy skin.  · A low blood count (anemia).  · Pale lining of the eyelids and surface of the eye (conjunctiva).  Symptoms develop slowly and may not be obvious until the kidney damage becomes severe. It is possible to have kidney disease for years without having any symptoms.  How is this diagnosed?  This condition may be diagnosed based on:  · Blood tests.  · Urine tests.  · Imaging tests, such as an ultrasound or CT scan.  · A test in which a sample of tissue is removed from the kidneys to be examined under a microscope (kidney biopsy).    These test results will help your health care provider determine how serious the CKD is.  How is this treated?  There is no cure for most cases of this condition, but treatment usually relieves symptoms and prevents or slows the progression of the disease. Treatment may include:  · Making diet changes, which may require you to avoid alcohol, salty foods (sodium), and foods that are high in potassium, calcium, and protein.  · Medicines:  ? To lower blood pressure.  ? To control blood glucose.  ? To relieve anemia.  ? To relieve swelling.  ? To protect your bones.  ? To improve the balance of electrolytes in your blood.  · Removing toxic waste from the body through types of  dialysis, if the kidneys can no longer do their job (kidney failure).  · Managing any other conditions that are causing your CKD or making it worse.  Follow these instructions at home:  Medicines  · Take over-the-counter and prescription medicines only as told by your health care provider. The dose of some medicines that you take may need to be adjusted.  · Do not take any new medicines unless approved by your health care provider. Many medicines can worsen your kidney damage.  · Do not take any vitamin and mineral supplements unless approved by your health care provider. Many nutritional supplements can worsen your kidney damage.  General instructions  · Follow your prescribed diet as told by your health care provider.  · Do not use any products that contain nicotine or tobacco, such as cigarettes and e-cigarettes. If you need help quitting, ask your health care provider.  · Monitor and track your blood pressure at home. Report changes in your blood pressure as told by your health care provider.  · If you are being treated for diabetes, monitor and track your blood sugar (blood glucose) levels as told by your health care provider.  · Maintain a healthy weight. If you need help with this, ask your health care provider.  · Start or continue an exercise plan. Exercise at least 30 minutes a day, 5 days a week.  · Keep your immunizations up to date as told by your health care provider.  · Keep all follow-up visits as told by your health care provider. This is important.  Where to find more information  · American Association of Kidney Patients: www.aakp.org  · National Kidney Foundation: www.kidney.org  · American Kidney Fund: www.akfinc.org  · [employer] [facility]: www.lifeoptions.org and www.kidneyschool.org  Contact a health care provider if:  · Your symptoms get worse.  · You develop new symptoms.  Get help right away if:  · You develop symptoms of ESRD, which include:  ? Headaches.  ? Numbness in the  hands or feet.  ? Easy bruising.  ? Frequent hiccups.  ? Chest pain.  ? Shortness of breath.  ? Lack of menstruation, in women.  · You have a fever.  · You have decreased urine production.  · You have pain or bleeding when you urinate.  Summary  · Chronic kidney disease (CKD) occurs when the kidneys become damaged slowly over a long period of time.  · The most common causes of this condition are diabetes and high blood pressure (hypertension).  · There is no cure for most cases of this condition, but treatment usually relieves symptoms and prevents or slows the progression of the disease. Treatment may include a combination of medicines and lifestyle changes.  This information is   not intended to replace advice given to you by your health care provider. Make sure you discuss any questions you have with your health care provider.  Document Released: 11/24/2007 Document Revised: 03/24/2016 Document Reviewed: 03/24/2016  Elsevier Interactive Patient Education © 2019 [employer]

## 2018-03-12 NOTE — Progress Notes (Signed)
Subjective:     Patient ID: Virginia Delgado , female    DOB: Sep 16, 1945 , 73 y.o.   MRN: [id number]   Chief Complaint  Patient presents with  . Hypertension    HPI  Hypertension  This is a chronic problem. The current episode started more than 1 year ago. The problem has been gradually improving since onset. The problem is controlled. Pertinent negatives include no blurred vision, chest pain, palpitations or shortness of breath. Risk factors for coronary artery disease include dyslipidemia and post-menopausal state. Hypertensive end-organ damage includes kidney disease.  She reports compliance with meds. She is also followed by Cardiology, Dr. Oval Linsey.    Past Medical History:  Diagnosis Date  . Dermatitis   . Hx of hepatitis C   . Hypertension      Family History  Problem Relation Age of Onset  . Cancer Mother        breast  . Heart disease Father   . Cancer Sister        sarcoma  . Hypertension Brother   . Ovarian cancer Maternal Grandmother   . Hypertension Sister      Current Outpatient Medications:  .  aspirin 81 MG tablet, Take 81 mg by mouth daily., Disp: , Rfl:  .  calcium carbonate (OSCAL) 1500 (600 Ca) MG TABS tablet, Take 600 mg of elemental calcium by mouth daily., Disp: , Rfl:  .  cholecalciferol (VITAMIN D) 1000 units tablet, Take 1,000 Units by mouth daily., Disp: , Rfl:  .  co-enzyme Q-10 50 MG capsule, Take 50 mg by mouth daily., Disp: , Rfl:  .  losartan-hydrochlorothiazide (HYZAAR) 50-12.5 MG tablet, Take 1 tablet by mouth daily., Disp: 90 tablet, Rfl: 1 .  magnesium 30 MG tablet, Take 30 mg by mouth 2 (two) times daily., Disp: , Rfl:  .  NONI, MORINDA CITRIFOLIA, PO, Take 1 capsule by mouth daily., Disp: , Rfl:  .  Omega-3 Fatty Acids (FISH OIL) 1200 MG CAPS, Take 1 capsule by mouth daily., Disp: , Rfl:  .  amLODipine (NORVASC) 5 MG tablet, Take 1 tablet (5 mg total) by mouth daily., Disp: 90 tablet, Rfl: 3 .  HYDROcodone-homatropine (HYDROMET) 5-1.5  MG/5ML syrup, Take 5 mLs by mouth every 6 (six) hours as needed for cough. (Patient not taking: Reported on 03/12/2018), Disp: 120 mL, Rfl: 0   Allergies  Allergen Reactions  . Contrast Media [Iodinated Diagnostic Agents] Swelling    Of face, especially eyes  . Gadolinium Derivatives Other (See Comments)    Felt extremities go numb briefly as contrast injected.  Had cough (bronchospasm?) briefly after scan.  Resolved on own without treatment.  07/13/15  . Lidocaine Other (See Comments)    Facial swelling after dental procedure  . Latex Rash and Other (See Comments)    and irritation     Review of Systems  Constitutional: Negative.   Eyes: Negative for blurred vision.  Respiratory: Negative.  Negative for shortness of breath.   Cardiovascular: Negative.  Negative for chest pain and palpitations.  Gastrointestinal: Negative.   Neurological: Negative.   Psychiatric/Behavioral: Negative.      Today's Vitals   03/12/18 1040  BP: 110/68  Pulse: 66  Temp: 98.1 F (36.7 C)  TempSrc: Oral  Weight: 144 lb 3.2 oz (65.4 kg)  Height: 5' 1.5" (1.562 m)  PainSc: 0-No pain   Body mass index is 26.81 kg/m.   Objective:  Physical Exam Vitals signs and nursing note reviewed.  Constitutional:  Appearance: Normal appearance.  HENT:     Head: Normocephalic and atraumatic.  Cardiovascular:     Rate and Rhythm: Normal rate and regular rhythm.     Heart sounds: Normal heart sounds.  Pulmonary:     Effort: Pulmonary effort is normal.     Breath sounds: Normal breath sounds.  Neurological:     General: No focal deficit present.     Mental Status: She is alert.  Psychiatric:        Mood and Affect: Mood normal.         Assessment And Plan:     1. Hypertensive nephropathy  Well controlled. She will continue with current meds. She is encouraged to avoid adding salt to her foods. She will rto in six months for next AWV and physical exam.   - Lipid Profile - CMP14+EGFR  2.  Chronic renal disease, stage II  She was given information on CKD. She is encouraged to stay well hydrated and to keep bp well controlled.   3. Hyperlipidemia LDL goal <100  I will check fasting lipid panel and LFTs today. She is encouraged to limit her fried food intake and to exercise 30 minutes five days weekly.   4. Estrogen deficiency  I will refer her to [facility] for dexa scan. I will make further recommendations once her results are available for review.   - DG Bone Density; Future     Maximino Greenland, MD

## 2018-03-18 ENCOUNTER — Other Ambulatory Visit: Payer: Self-pay | Admitting: Cardiovascular Disease

## 2018-03-21 DIAGNOSIS — I129 Hypertensive chronic kidney disease with stage 1 through stage 4 chronic kidney disease, or unspecified chronic kidney disease: Secondary | ICD-10-CM | POA: Diagnosis not present

## 2018-03-22 LAB — CMP14+EGFR
ALT: 14 IU/L (ref 0–32)
AST: 19 IU/L (ref 0–40)
Albumin/Globulin Ratio: 1.8 (ref 1.2–2.2)
Albumin: 4.4 g/dL (ref 3.8–4.8)
Alkaline Phosphatase: 67 IU/L (ref 39–117)
BUN/Creatinine Ratio: 10 — ABNORMAL LOW (ref 12–28)
BUN: 10 mg/dL (ref 8–27)
Bilirubin Total: 1.1 mg/dL (ref 0.0–1.2)
CO2: 25 mmol/L (ref 20–29)
Calcium: 10.7 mg/dL — ABNORMAL HIGH (ref 8.7–10.3)
Chloride: 101 mmol/L (ref 96–106)
Creatinine, Ser: 1.02 mg/dL — ABNORMAL HIGH (ref 0.57–1.00)
GFR calc Af Amer: 64 mL/min/1.73
GFR calc non Af Amer: 56 mL/min/1.73 — ABNORMAL LOW
Globulin, Total: 2.4 g/dL (ref 1.5–4.5)
Glucose: 98 mg/dL (ref 65–99)
Potassium: 3.8 mmol/L (ref 3.5–5.2)
Sodium: 143 mmol/L (ref 134–144)
Total Protein: 6.8 g/dL (ref 6.0–8.5)

## 2018-03-22 LAB — LIPID PANEL
Chol/HDL Ratio: 3.2 ratio (ref 0.0–4.4)
Cholesterol, Total: 243 mg/dL — ABNORMAL HIGH (ref 100–199)
HDL: 76 mg/dL (ref >39)
LDL CALC: 144 mg/dL — AB (ref 0–99)
TRIGLYCERIDES: 115 mg/dL (ref 0–149)
VLDL Cholesterol Cal: 23 mg/dL (ref 5–40)

## 2018-03-27 ENCOUNTER — Telehealth: Payer: Self-pay | Admitting: *Deleted

## 2018-03-27 ENCOUNTER — Encounter: Payer: Self-pay | Admitting: Cardiovascular Disease

## 2018-03-27 ENCOUNTER — Ambulatory Visit: Payer: Medicare HMO | Admitting: Cardiovascular Disease

## 2018-03-27 VITALS — BP 120/76 | HR 63 | Ht 60.0 in | Wt 145.6 lb

## 2018-03-27 DIAGNOSIS — E785 Hyperlipidemia, unspecified: Secondary | ICD-10-CM | POA: Diagnosis not present

## 2018-03-27 DIAGNOSIS — R400 Somnolence: Secondary | ICD-10-CM | POA: Diagnosis not present

## 2018-03-27 DIAGNOSIS — Z8249 Family history of ischemic heart disease and other diseases of the circulatory system: Secondary | ICD-10-CM | POA: Diagnosis not present

## 2018-03-27 DIAGNOSIS — I10 Essential (primary) hypertension: Secondary | ICD-10-CM

## 2018-03-27 NOTE — Progress Notes (Signed)
Cardiology Office Note   Date:  03/27/2018   ID:  [name], DOB 1945/07/08, MRN [id number]  PCP:  Glendale Chard, MD  Cardiologist:   Skeet Latch, MD   Chief Complaint  Patient presents with  . Follow-up     History of Present Illness: Virginia Delgado is a 73 y.o. female with hypertension and Hepatitis C s/p treatment who presents for follow up. She was initially seen 06/2015 for  management of hypertension.   She was previously treated with HCTZ and metoprolol and her BP was well-controlled until 05/2015.  [name] was seen in the ED on 4/27.  Her BP was in the 200s/110s.  She was started on amlodipine and received ativan.  Head CT was negative for stroke.  She followed up with her PCP, Dr. Glendale Chard, who started her on Edarbi and amlodipine.  She was seen in clinic 07/06/15, at which time metoprolol was switched to carvedilol.  Renal artery Doppler was negative for stenosis. She also had a exercise Cardiolite that was negative for ischemia.  She achieved 8.5 METS. Carvedilol was stopped due to fatigue and amlodipine was increased.  She saw Dr. Baird Cancer and was switched from amlodipine to [facility] because she thought her body was immune to amlodipine.  At her last appointment [name] was switched to HCTZ/losartan.  She followed up with our pharmacist 09/2017 and her BP was above goal so amlodipine was added back.   Since her last appointment her BP has been well-controlled.  It has been mostly in the 120s over 60s but occasionally is as high as the 140s or 50s.  [name] has been struggling with her sleep.  She wakes up after 4 hours of sleep and has difficulty falling back asleep.  She is unsure whether she snores.  She has a Fitbit watch that shows she has had some oxygen desaturations overnight.  She discussed this with Dr. Baird Cancer and would like to consider having a sleep study.  Her mother is now in hospice care.  She had a fall in the home and has experienced a downward spiral.  Ms.  Radke continues to exercise 3 days/week.  She walks for 2 or 3 miles and does strength training as well.  She has no exertional chest pain or shortness of breath.  She has no orthopnea, PND, or lower extremity edema.  She has a history of statin myalgias and would like to avoid taking any other medications if at all possible.   Past Medical History:  Diagnosis Date  . Dermatitis   . Hx of hepatitis C   . Hypertension     Past Surgical History:  Procedure Laterality Date  . CESAREAN SECTION    . UMBILICAL HERNIA REPAIR       Current Outpatient Medications  Medication Sig Dispense Refill  . amLODipine (NORVASC) 5 MG tablet Take 1 tablet (5 mg total) by mouth daily. 90 tablet 3  . aspirin 81 MG tablet Take 81 mg by mouth daily.    . calcium carbonate (OSCAL) 1500 (600 Ca) MG TABS tablet Take 600 mg of elemental calcium by mouth daily.    . cholecalciferol (VITAMIN D) 1000 units tablet Take 1,000 Units by mouth daily.    Marland Kitchen co-enzyme Q-10 50 MG capsule Take 50 mg by mouth daily.    Marland Kitchen losartan-hydrochlorothiazide (HYZAAR) 50-12.5 MG tablet TAKE 1 TABLET BY MOUTH EVERY DAY 90 tablet 1  . magnesium 30 MG tablet Take 30 mg by mouth  2 (two) times daily.    . NONI, MORINDA CITRIFOLIA, PO Take 1 capsule by mouth daily.    . Omega-3 Fatty Acids (FISH OIL) 1200 MG CAPS Take 1 capsule by mouth daily.     No current facility-administered medications for this visit.     Allergies:   Contrast media [iodinated diagnostic agents]; Gadolinium derivatives; Lidocaine; and Latex    Social History:  The patient  reports that she has never smoked. She has never used smokeless tobacco. She reports current alcohol use. She reports that she does not use drugs.   Family History:  The patient's family history includes Cancer in her mother and sister; Heart disease in her father; Hypertension in her brother and sister; Ovarian cancer in her maternal grandmother.    ROS:  Please see the history of present  illness.   Otherwise, review of systems are positive for weight gain.   All other systems are reviewed and negative.    PHYSICAL EXAM: VS:  BP 120/76   Pulse 63   Ht 5' (1.524 m)   Wt 145 lb 9.6 oz (66 kg)   BMI 28.44 kg/m  , BMI Body mass index is 28.44 kg/m. GENERAL:  Well appearing HEENT: Pupils equal round and reactive, fundi not visualized, oral mucosa unremarkable NECK:  No jugular venous distention, waveform within normal limits, carotid upstroke brisk and symmetric, no bruits LUNGS:  Clear to auscultation bilaterally HEART:  RRR.  PMI not displaced or sustained,S1 and S2 within normal limits, no S3, no S4, no clicks, no rubs, no murmurs ABD:  Flat, positive bowel sounds normal in frequency in pitch, no bruits, no rebound, no guarding, no midline pulsatile mass, no hepatomegaly, no splenomegaly EXT:  2 plus pulses throughout, no edema, no cyanosis no clubbing SKIN:  No rashes no nodules NEURO:  Cranial nerves II through XII grossly intact, motor grossly intact throughout PSYCH:  Cognitively intact, oriented to person place and time   EKG:  EKG is ordered today. The ekg ordered 06/24/15 demonstrates sinus rhythm rate 80 bpm. 07/20/16: Sinus bradycardia.  Rate 59 bpm.   09/20/17: Sinus rhythm.  Rate 66 bpm.   03/27/18: Sinus rhythm.  Rate 63 bpm.  Renal artery ultrasound 07/09/15: Normal renal arteries  Lexiscan Cardiolite 07/17/15:  Nuclear stress EF: 64%.  The left ventricular ejection fraction is normal (55-65%).  Blood pressure demonstrated a hypertensive response to exercise.  ST segment depression was noted during stress in the II, III, aVF, V5 and V6 leads.  The study is normal.   Recent Labs: 03/21/2018: ALT 14; BUN 10; Creatinine, Ser 1.02; Potassium 3.8; Sodium 143   05/20/15: Hemoglobin A1c 5.7% Total cholesterol 238, tri 96, HDL 80, LDL 139 AST 19 ALT 14  09/06/17:  Total cholesterol 254, triglycerides 75, HDL 85, LDL 154 Hemoglobin 13.6 Creatinine  1.04 Potassium 3.9  Lipid Panel    Component Value Date/Time   CHOL 243 (H) 03/21/2018 1159   TRIG 115 03/21/2018 1159   HDL 76 03/21/2018 1159   CHOLHDL 3.2 03/21/2018 1159   LDLCALC 144 (H) 03/21/2018 1159      Wt Readings from Last 3 Encounters:  03/27/18 145 lb 9.6 oz (66 kg)  03/12/18 144 lb 3.2 oz (65.4 kg)  01/19/18 141 lb 9.6 oz (64.2 kg)      ASSESSMENT AND PLAN:  # Hypertension: Ms. Thetford BP is well-controlled. Continue amlodipine, losartan and HCTZ.   # Chest pain: Stress test was negative for ischemia.  No recurrent  chest pain.     # CV Disease Prevention: # Hyperlipidemia:  She hasn't been interested in re-challenging with a statin.  We will get a coronary calcium score to better risk stratify.  Continue diet and exercise for now.   # Hypoxia: # Daytime somnolence: Ms. Kelliher has difficulty staying asleep.  Her watch notes episodes of hypoxia.  She is unsure if she snores.  We will get a sleep study to better assess.   Current medicines are reviewed at length with the patient today.  The patient does not have concerns regarding medicines.  The following changes have been made: none  Labs/ tests ordered today include:   Orders Placed This Encounter  Procedures  . CT CARDIAC SCORING  . EKG 12-Lead  . Split night study     Disposition:   FU with Abbie Berling C. Oval Linsey, MD, [facility] in 1 year    Signed, Tiasia Weberg C. Oval Linsey, MD, [facility]  03/27/2018 9:07 AM    [place]

## 2018-03-27 NOTE — Telephone Encounter (Signed)
-----   Message from Roland Earl sent at 03/27/2018  8:45 AM EST -----

## 2018-03-27 NOTE — Telephone Encounter (Signed)
PA for sleep study submitted to [employer] via web portal. 

## 2018-03-27 NOTE — Patient Instructions (Signed)
Medication Instructions:  Your physician recommends that you continue on your current medications as directed. Please refer to the Current Medication list given to you today.  If you need a refill on your cardiac medications before your next appointment, please call your pharmacy.   Lab work: NONE  Testing/Procedures: CALCIUM SCORE, WILL COST $150 OUT OF POCKET [facility] [address]  Your physician has recommended that you have a sleep study. This test records several body functions during sleep, including: brain activity, eye movement, oxygen and carbon dioxide blood levels, heart rate and rhythm, breathing rate and rhythm, the flow of air through your mouth and nose, snoring, body muscle movements, and chest and belly movement. ONCE INSURANCE HAS APPROVED THE OFFICE WILL CALL YOU TO SCHEDULE. IF YOU DO NOT HEAR IN 2 WEEKS CALL [phone]  Follow-Up: At [facility], you and your health needs are our priority.  As part of our continuing mission to provide you with exceptional heart care, we have created designated Provider Care Teams.  These Care Teams include your primary Cardiologist (physician) and Advanced Practice Providers (APPs -  Physician Assistants and Nurse Practitioners) who all work together to provide you with the care you need, when you need it. You will need a follow up appointment in 12 months.  Please call our office 2 months in advance to schedule this appointment.  You may see DR [facility] or one of the following Advanced Practice Providers on your designated Care Team:   Kerin Ransom, PA-C Roby Lofts, [place] . Sande Rives, PA-C  Any Other Special Instructions Will Be Listed Below (If Applicable).   Sleep Studies A sleep study (polysomnogram) is a series of tests done while you are sleeping. A sleep study records your brain waves, heart rate, breathing rate, oxygen level, and eye and leg movements. A sleep study helps your health care  provider:  See how well you sleep.  Diagnose a sleep disorder.  Determine how severe your sleep disorder is.  Create a plan to treat your sleep disorder. Your health care provider may recommend a sleep study if you:  Feel sleepy on most days.  Snore loudly while sleeping.  Have unusual behaviors while you sleep, such as walking.  Have brief periods in which you stop breathing during sleep (sleepapnea).  Fall asleep suddenly during the day (narcolepsy).  Have trouble falling asleep or staying asleep (insomnia).  Feel like you need to move your legs when trying to fall asleep (restless legs syndrome).  Move your legs by flexing and extending them regularly while asleep (periodic limb movement disorder).  Act out your dreams while you sleep (sleep behavior disorder).  Feel like you cannot move when you first wake up (sleep paralysis). What tests are part of a sleep study? Most sleep studies record the following during sleep:  Brain activity.  Eye movements.  Heart rate and rhythm.  Breathing rate and rhythm.  Blood-oxygen level.  Blood pressure.  Chest and belly movement as you breathe.  Arm and leg movements.  Snoring or other noises.  Body position. Where are sleep studies done? Sleep studies are done at sleep centers. A sleep center may be inside a hospital, office, or clinic. The room where you have the study may look like a hospital room or a hotel room. The health care providers doing the study may come in and out of the room during the study. Most of the time, they will be in another room monitoring  your test as you sleep. How are sleep studies done? Most sleep studies are done during a normal period of time for a full night of sleep. You will arrive at the study center in the evening and go home in the morning. Before the test  Bring your pajamas and toothbrush with you to the sleep study.  Do not have caffeine on the day of your sleep study.  Do  not drink alcohol on the day of your sleep study.  Your health care provider will let you know if you should stop taking any of your regular medicines before the test. During the test      Round, sticky patches with sensors attached to recording wires (electrodes) are placed on your scalp, face, chest, and limbs.  Wires from all the electrodes and sensors run from your bed to a computer. The wires can be taken off and put back on if you need to get out of bed to go to the bathroom.  A sensor is placed over your nose to measure airflow.  A finger clip is put on your finger or ear to measure your blood oxygen level (pulse oximetry).  A belt is placed around your belly and a belt is placed around your chest to measure breathing movements.  If you have signs of the sleep disorder called sleep apnea during your test, you may get a treatment mask to wear for the second half of the night. ? The mask provides positive airway pressure (PAP) to help you breathe better during sleep. This may greatly improve your sleep apnea. ? You will then have all tests done again with the mask in place to see if your measurements and recordings change. After the test  A medical doctor who specializes in sleep will evaluate the results of your sleep study and share them with you and your primary health care provider.  Based on your results, your medical history, and a physical exam, you may be diagnosed with a sleep disorder, such as: ? Sleep apnea. ? Restless legs syndrome. ? Sleep-related behavior disorder. ? Sleep-related movement disorders. ? Sleep-related seizure disorders.  Your health care team will help determine your treatment options based on your diagnosis. This may include: ? Improving your sleep habits (sleep hygiene). ? Wearing a continuous positive airway pressure (CPAP) or bi-level positive airway pressure (BPAP) mask. ? Wearing an oral device at night to improve breathing and reduce  snoring. ? Taking medicines. Follow these instructions at home:  Take over-the-counter and prescription medicines only as told by your health care provider.  If you are instructed to use a CPAP or BPAP mask, make sure you use it nightly as directed.  Make any lifestyle changes that your health care provider recommends.  If you were given a device to open your airway while you sleep, use it only as told by your health care provider.  Do not use any tobacco products, such as cigarettes, chewing tobacco, and e-cigarettes. If you need help quitting, ask your health care provider.  Keep all follow-up visits as told by your health care provider. This is important. Summary  A sleep study (polysomnogram) is a series of tests done while you are sleeping. It shows how well you sleep.  Most sleep studies are done over one full night of sleep. You will arrive at the study center in the evening and go home in the morning.  If you have signs of the sleep disorder called sleep apnea during your test,  you may get a treatment mask to wear for the second half of the night.  A medical doctor who specializes in sleep will evaluate the results of your sleep study and share them with your primary health care provider. This information is not intended to replace advice given to you by your health care provider. Make sure you discuss any questions you have with your health care provider. Document Released: 08/21/2002 Document Revised: 03/14/2017 Document Reviewed: 03/14/2017 Elsevier Interactive Patient Education  [employer].

## 2018-03-29 ENCOUNTER — Ambulatory Visit: Payer: Medicare HMO | Admitting: Cardiovascular Disease

## 2018-03-29 ENCOUNTER — Other Ambulatory Visit: Payer: Self-pay

## 2018-03-29 ENCOUNTER — Other Ambulatory Visit: Payer: Medicare HMO

## 2018-03-30 ENCOUNTER — Other Ambulatory Visit: Payer: Self-pay | Admitting: Cardiovascular Disease

## 2018-03-30 DIAGNOSIS — R0902 Hypoxemia: Secondary | ICD-10-CM

## 2018-03-30 DIAGNOSIS — R400 Somnolence: Secondary | ICD-10-CM

## 2018-03-30 DIAGNOSIS — I10 Essential (primary) hypertension: Secondary | ICD-10-CM

## 2018-03-30 LAB — PARATHYROID HORMONE, INTACT (NO CA): PTH: 42 pg/mL (ref 15–65)

## 2018-04-06 ENCOUNTER — Telehealth: Payer: Self-pay | Admitting: *Deleted

## 2018-04-06 NOTE — Telephone Encounter (Signed)
Received a e-mail from [facility]) denying  In lab sleep study. HST ordered.

## 2018-04-18 ENCOUNTER — Encounter (HOSPITAL_BASED_OUTPATIENT_CLINIC_OR_DEPARTMENT_OTHER): Payer: Medicare HMO

## 2018-04-19 ENCOUNTER — Inpatient Hospital Stay: Admission: RE | Admit: 2018-04-19 | Payer: Medicare HMO | Source: Ambulatory Visit

## 2018-04-20 ENCOUNTER — Ambulatory Visit (HOSPITAL_BASED_OUTPATIENT_CLINIC_OR_DEPARTMENT_OTHER): Payer: Medicare HMO | Attending: Cardiovascular Disease | Admitting: Cardiovascular Disease

## 2018-04-20 DIAGNOSIS — I10 Essential (primary) hypertension: Secondary | ICD-10-CM | POA: Insufficient documentation

## 2018-04-20 DIAGNOSIS — R400 Somnolence: Secondary | ICD-10-CM

## 2018-04-20 DIAGNOSIS — R0902 Hypoxemia: Secondary | ICD-10-CM | POA: Diagnosis not present

## 2018-04-20 DIAGNOSIS — Z7982 Long term (current) use of aspirin: Secondary | ICD-10-CM | POA: Diagnosis not present

## 2018-04-20 DIAGNOSIS — R0683 Snoring: Secondary | ICD-10-CM | POA: Diagnosis not present

## 2018-04-20 DIAGNOSIS — Z79899 Other long term (current) drug therapy: Secondary | ICD-10-CM | POA: Diagnosis not present

## 2018-04-26 ENCOUNTER — Encounter (HOSPITAL_BASED_OUTPATIENT_CLINIC_OR_DEPARTMENT_OTHER): Payer: Self-pay | Admitting: Cardiovascular Disease

## 2018-04-26 NOTE — Procedures (Signed)
    Patient Name: Virginia Delgado, Virginia Delgado Date: 04/22/2018 Gender: Female D.O.B: [birth year] Age (years): 82 Referring Provider: Skeet Latch Height (inches): 61 Interpreting Physician: Shelva Majestic MD, ABSM Weight (lbs): 143 RPSGT: Jacolyn Reedy BMI: 27 MRN: [id number] Neck Size: 14.50  CLINICAL INFORMATION Sleep Study Type: HST  Indication for sleep study: Excessive Daytime Sleepiness  Epworth Sleepiness Score: 12  SLEEP STUDY TECHNIQUE A multi-channel overnight portable sleep study was performed. The channels recorded were: nasal airflow, thoracic respiratory movement, and oxygen saturation with a pulse oximetry. Snoring was also monitored.  MEDICATIONS     amLODipine (NORVASC) 5 MG tablet (Expired)             aspirin 81 MG tablet         calcium carbonate (OSCAL) 1500 (600 Ca) MG TABS tablet         cholecalciferol (VITAMIN D) 1000 units tablet         co-enzyme Q-10 50 MG capsule         losartan-hydrochlorothiazide (HYZAAR) 50-12.5 MG tablet         magnesium 30 MG tablet         NONI, MORINDA CITRIFOLIA, PO         Omega-3 Fatty Acids (FISH OIL) 1200 MG CAPS      Patient self administered medications include: N/A.  SLEEP ARCHITECTURE Patient was studied for 463.5 minutes. The sleep efficiency was 97.7 % and the patient was supine for 80.5%. The arousal index was 0.0 per hour.  RESPIRATORY PARAMETERS The overall AHI was 1.4 per hour, with a central apnea index of 0.0 per hour. Supine AHI 1.8/h and non-supine AHI 0.0/h.  The oxygen nadir was consistently > 90% with artifactoral recording 69% during sleep.  CARDIAC DATA Mean heart rate during sleep was 71.4 bpm.  IMPRESSIONS - No significant obstructive sleep apnea occurred during this study (AHI = 1.4/h). - No significant central sleep apnea occurred during this study (CAI = 0.0/h). - No significant oxygen desaturation was noted during this study . - Patient snored 20.7% during the  sleep.  DIAGNOSIS - Snoring - Daytime sleepiness  RECOMMENDATIONS - At present there is no indication for CPAP.  - Efforts should be made to optimize nasal and orpopharyngeal patency.  - Consider alternatives for the treatment of snoring.  - If patient continues to have excessive daytime sleepiness consider a multiple latency sleep test (MLST) to assess for idiopathic hypersomnolence or narcolepsy. - Avoid alcohol, sedatives and other CNS depressants that may worsen sleep apnea and disrupt normal sleep architecture. - Sleep hygiene should be reviewed to assess factors that may improve sleep quality. - Weight management and regular exercise should be initiated or continued.  [Electronically signed] 04/26/2018 06:31 PM  Shelva Majestic MD, [facility], ABSM Diplomate, American Board of Sleep Medicine   NPI: [id number] [place] PH: [phone]   FX: [phone] [place]

## 2018-04-27 ENCOUNTER — Telehealth: Payer: Self-pay | Admitting: *Deleted

## 2018-04-27 NOTE — Telephone Encounter (Signed)
-----   Message from Troy Sine, MD sent at 04/26/2018  6:34 PM EST ----- Virginia Delgado, please notify pt of the result

## 2018-04-27 NOTE — Telephone Encounter (Signed)
Patient notified sleep study WNL. No indication for CPAP therapy.

## 2018-05-03 ENCOUNTER — Encounter: Payer: Self-pay | Admitting: Internal Medicine

## 2018-05-08 ENCOUNTER — Encounter: Payer: Self-pay | Admitting: Internal Medicine

## 2018-05-09 ENCOUNTER — Inpatient Hospital Stay: Admission: RE | Admit: 2018-05-09 | Payer: Medicare HMO | Source: Ambulatory Visit

## 2018-05-10 ENCOUNTER — Telehealth: Payer: Self-pay

## 2018-05-10 ENCOUNTER — Other Ambulatory Visit: Payer: Self-pay

## 2018-05-10 ENCOUNTER — Emergency Department (HOSPITAL_COMMUNITY): Payer: Medicare HMO

## 2018-05-10 ENCOUNTER — Encounter (HOSPITAL_COMMUNITY): Payer: Self-pay | Admitting: Emergency Medicine

## 2018-05-10 ENCOUNTER — Emergency Department (HOSPITAL_COMMUNITY)
Admission: EM | Admit: 2018-05-10 | Discharge: 2018-05-10 | Disposition: A | Discharge status: Home / Self Care | Payer: Medicare HMO | Attending: Emergency Medicine | Admitting: Emergency Medicine

## 2018-05-10 DIAGNOSIS — Z91040 Latex allergy status: Secondary | ICD-10-CM | POA: Diagnosis not present

## 2018-05-10 DIAGNOSIS — R110 Nausea: Secondary | ICD-10-CM | POA: Diagnosis not present

## 2018-05-10 DIAGNOSIS — R69 Illness, unspecified: Secondary | ICD-10-CM

## 2018-05-10 DIAGNOSIS — J111 Influenza due to unidentified influenza virus with other respiratory manifestations: Secondary | ICD-10-CM

## 2018-05-10 DIAGNOSIS — R05 Cough: Secondary | ICD-10-CM | POA: Diagnosis not present

## 2018-05-10 DIAGNOSIS — R509 Fever, unspecified: Secondary | ICD-10-CM | POA: Diagnosis present

## 2018-05-10 DIAGNOSIS — I10 Essential (primary) hypertension: Secondary | ICD-10-CM | POA: Diagnosis not present

## 2018-05-10 DIAGNOSIS — Z79899 Other long term (current) drug therapy: Secondary | ICD-10-CM | POA: Diagnosis not present

## 2018-05-10 DIAGNOSIS — J101 Influenza due to other identified influenza virus with other respiratory manifestations: Secondary | ICD-10-CM | POA: Diagnosis not present

## 2018-05-10 DIAGNOSIS — Z7982 Long term (current) use of aspirin: Secondary | ICD-10-CM | POA: Diagnosis not present

## 2018-05-10 DIAGNOSIS — R42 Dizziness and giddiness: Secondary | ICD-10-CM | POA: Diagnosis not present

## 2018-05-10 LAB — CBC WITH DIFFERENTIAL/PLATELET
Abs Immature Granulocytes: 0.02 10*3/uL (ref 0.00–0.07)
Basophils Absolute: 0 10*3/uL (ref 0.0–0.1)
Basophils Relative: 0 %
EOS PCT: 3 %
Eosinophils Absolute: 0.2 10*3/uL (ref 0.0–0.5)
HCT: 38.9 % (ref 36.0–46.0)
Hemoglobin: 12.9 g/dL (ref 12.0–15.0)
IMMATURE GRANULOCYTES: 0 %
Lymphocytes Relative: 19 %
Lymphs Abs: 1.2 10*3/uL (ref 0.7–4.0)
MCH: 29.1 pg (ref 26.0–34.0)
MCHC: 33.2 g/dL (ref 30.0–36.0)
MCV: 87.8 fL (ref 80.0–100.0)
Monocytes Absolute: 0.8 10*3/uL (ref 0.1–1.0)
Monocytes Relative: 13 %
Neutro Abs: 4.1 10*3/uL (ref 1.7–7.7)
Neutrophils Relative %: 65 %
Platelets: 200 10*3/uL (ref 150–400)
RBC: 4.43 MIL/uL (ref 3.87–5.11)
RDW: 12.7 % (ref 11.5–15.5)
WBC: 6.4 10*3/uL (ref 4.0–10.5)
nRBC: 0 % (ref 0.0–0.2)

## 2018-05-10 LAB — COMPREHENSIVE METABOLIC PANEL
ALK PHOS: 65 U/L (ref 38–126)
ALT: 12 U/L (ref 0–44)
AST: 32 U/L (ref 15–41)
Albumin: 4.1 g/dL (ref 3.5–5.0)
Anion gap: 8 (ref 5–15)
BUN: 16 mg/dL (ref 8–23)
CALCIUM: 10.3 mg/dL (ref 8.9–10.3)
CO2: 25 mmol/L (ref 22–32)
Chloride: 103 mmol/L (ref 98–111)
Creatinine, Ser: 1.05 mg/dL — ABNORMAL HIGH (ref 0.44–1.00)
GFR calc Af Amer: >60 mL/min (ref >60)
GFR calc non Af Amer: 54 mL/min — ABNORMAL LOW (ref >60)
Glucose, Bld: 140 mg/dL — ABNORMAL HIGH (ref 70–99)
Potassium: 3.8 mmol/L (ref 3.5–5.1)
Sodium: 136 mmol/L (ref 135–145)
Total Bilirubin: 1.3 mg/dL — ABNORMAL HIGH (ref 0.3–1.2)
Total Protein: 6.8 g/dL (ref 6.5–8.1)

## 2018-05-10 LAB — URINALYSIS, ROUTINE W REFLEX MICROSCOPIC
Bilirubin Urine: NEGATIVE
Glucose, UA: NEGATIVE mg/dL
Hgb urine dipstick: NEGATIVE
Ketones, ur: NEGATIVE mg/dL
Leukocytes,Ua: NEGATIVE
Nitrite: NEGATIVE
Protein, ur: NEGATIVE mg/dL
Specific Gravity, Urine: 1.017 (ref 1.005–1.030)
pH: 6 (ref 5.0–8.0)

## 2018-05-10 LAB — INFLUENZA PANEL BY PCR (TYPE A & B)
Influenza A By PCR: NEGATIVE
Influenza B By PCR: NEGATIVE

## 2018-05-10 NOTE — Discharge Instructions (Addendum)
As discussed, your evaluation today has been largely reassuring.  But, it is important that you monitor your condition carefully, and do not hesitate to return to the ED if you develop new, or concerning changes in your condition. ? ?Otherwise, please follow-up with your physician for appropriate ongoing care. ? ?

## 2018-05-10 NOTE — ED Triage Notes (Signed)
Patient reports dry cough, low-grade fevers, chest heaviness, lightheadedness x 3 days. Recent travel to [facility] and [place]. Resp e/u, skin w/d. NAD noted.

## 2018-05-10 NOTE — ED Provider Notes (Signed)
[facility] Provider Note   CSN: [id number] Arrival date & time: 05/10/18  1745    History   Chief Complaint Chief Complaint  Patient presents with  . Influenza    HPI Virginia Delgado is a 73 y.o. female.     HPI Presents concern of dry cough, low-grade fever, chest heaviness, lightheadedness. Patient notes that she is recently traveled to [place] and [place].  On she has no known sick contacts, but with concern for her travel, her new symptoms she presents for evaluation. She is well, has a history of hypertension, takes medication regularly, does not smoke. This illness began about 3 days ago Since onset no relief with OTC medication.  Past Medical History:  Diagnosis Date  . Dermatitis   . Hx of hepatitis C   . Hypertension     Patient Active Problem List   Diagnosis Date Noted  . Hypertensive nephropathy 03/12/2018  . Chronic renal disease, stage II 03/12/2018  . Estrogen deficiency 03/12/2018  . Hyperlipidemia LDL goal <100 10/20/2017  . Menopause 07/19/2011  . Hypertension   . Hepatitis C     Past Surgical History:  Procedure Laterality Date  . CESAREAN SECTION    . UMBILICAL HERNIA REPAIR       OB History    Gravida  4   Para  2   Term  2   Preterm      AB  2   Living        SAB      TAB  2   Ectopic      Multiple      Live Births               Home Medications    Prior to Admission medications   Medication Sig Start Date End Date Taking? Authorizing Provider  amLODipine (NORVASC) 5 MG tablet Take 1 tablet (5 mg total) by mouth daily. 11/23/17 03/27/18  Skeet Latch, MD  aspirin 81 MG tablet Take 81 mg by mouth daily.    [provider]  calcium carbonate (OSCAL) 1500 (600 Ca) MG TABS tablet Take 600 mg of elemental calcium by mouth daily.    [provider]  cholecalciferol (VITAMIN D) 1000 units tablet Take 1,000 Units by mouth daily.    [provider]   co-enzyme Q-10 50 MG capsule Take 50 mg by mouth daily.    [provider]  losartan-hydrochlorothiazide (HYZAAR) 50-12.5 MG tablet TAKE 1 TABLET BY MOUTH EVERY DAY 03/19/18   Skeet Latch, MD  magnesium 30 MG tablet Take 30 mg by mouth 2 (two) times daily.    [provider]  NONI, MORINDA CITRIFOLIA, PO Take 1 capsule by mouth daily.    [provider]  Omega-3 Fatty Acids (FISH OIL) 1200 MG CAPS Take 1 capsule by mouth daily.    [provider]    Family History Family History  Problem Relation Age of Onset  . Cancer Mother        breast  . Heart disease Father   . Cancer Sister        sarcoma  . Hypertension Brother   . Ovarian cancer Maternal Grandmother   . Hypertension Sister     Social History Social History   Tobacco Use  . Smoking status: Never Smoker  . Smokeless tobacco: Never Used  Substance Use Topics  . Alcohol use: Yes    Comment: social  . Drug use: No  Allergies   Contrast media [iodinated diagnostic agents]; Gadolinium derivatives; Lidocaine; and Latex   Review of Systems Review of Systems  Constitutional:       Per HPI, otherwise negative  HENT:       Per HPI, otherwise negative  Respiratory:       Per HPI, otherwise negative  Cardiovascular:       Per HPI, otherwise negative  Gastrointestinal: Positive for nausea. Negative for vomiting.  Endocrine:       Negative aside from HPI  Genitourinary:       Neg aside from HPI   Musculoskeletal:       Per HPI, otherwise negative  Skin: Negative.   Neurological: Positive for light-headedness. Negative for syncope.     Physical Exam Updated Vital Signs BP (!) 158/79   Pulse 85   Temp 99 F (37.2 C) (Oral)   Resp 16   SpO2 97%   Physical Exam Vitals signs and nursing note reviewed.  Constitutional:      General: She is not in acute distress.    Appearance: She is well-developed.  HENT:     Head: Normocephalic and atraumatic.  Eyes:      Conjunctiva/sclera: Conjunctivae normal.  Cardiovascular:     Rate and Rhythm: Normal rate and regular rhythm.  Pulmonary:     Effort: Pulmonary effort is normal. No respiratory distress.     Breath sounds: Normal breath sounds. No stridor.  Abdominal:     General: There is no distension.  Skin:    General: Skin is warm and dry.  Neurological:     Mental Status: She is alert and oriented to person, place, and time.     Cranial Nerves: No cranial nerve deficit.      ED Treatments / Results  Labs (all labs ordered are listed, but only abnormal results are displayed) Labs Reviewed  COMPREHENSIVE METABOLIC PANEL - Abnormal; Notable for the following components:      Result Value   Glucose, Bld 140 (*)    Creatinine, Ser 1.05 (*)    Total Bilirubin 1.3 (*)    GFR calc non Af Amer 54 (*)    All other components within normal limits  CBC WITH DIFFERENTIAL/PLATELET  URINALYSIS, ROUTINE W REFLEX MICROSCOPIC  INFLUENZA PANEL BY PCR (TYPE A & B)    EKG EKG Interpretation  Date/Time:  Thursday May 10 2018 18:13:51 EDT Ventricular Rate:  69 PR Interval:  140 QRS Duration: 74 QT Interval:  376 QTC Calculation: 402 R Axis:   48 Text Interpretation:  Normal sinus rhythm Nonspecific T wave abnormality Abnormal ekg Confirmed by Carmin Muskrat [phone]) on 05/10/2018 6:18:30 PM   Radiology Dg Chest 2 View  Result Date: 05/10/2018 CLINICAL DATA:  r/o PNA, influenza like illness. Pt stated that she's been experiencing symptoms since Tuesday 05/08/18. Pt stated that her cough has been dry and unproductive, with some middle chest pressure. Pt denied sob. Hx; HTN, Hep C EXAM: CHEST - 2 VIEW COMPARISON:  09/21/2004 FINDINGS: The heart size and mediastinal contours are within normal limits. Both lungs are clear. No pleural effusion or pneumothorax. The visualized skeletal structures are unremarkable. IMPRESSION: No active cardiopulmonary disease. Electronically Signed   By: Lajean Manes M.D.    On: 05/10/2018 20:16    Procedures Procedures (including critical care time)  Medications Ordered in ED Medications - No data to display   Initial Impression / Assessment and Plan / ED Course  I have reviewed the triage vital  signs and the nursing notes.  Pertinent labs & imaging results that were available during my care of the patient were reviewed by me and considered in my medical decision making (see chart for details).        9:43 PM Patient awake, alert, in no distress, has been ambulatory, has no hypoxia, no hemodynamic stability, no fever.  This well-appearing elderly female presents with subjective fever, mild cough, mild lightheadedness. Patient is awake, alert, in no distress, with no fever, no increased work of breathing, no tachypnea, no tachycardia. Patient does have travel history, but no confirmed contacts with notable coronavirus patients. Patient does not meet criteria for testing With reassuring findings here she was discharged with instructions to minimize social contacts, follow-up with her physician for additional monitoring, management.  Final Clinical Impressions(s) / ED Diagnoses   Final diagnoses:  Influenza-like illness     Carmin Muskrat, MD 05/10/18 2145

## 2018-05-10 NOTE — Telephone Encounter (Signed)
The patient called and wanted to know about getting tested for the Covid-19 because she recently returned from [place] on Monday and has been having a fever, body pain, cough, and it feels like she is breathing in a bag. Virginia Delgado contacted at [phone] - advised to have patient come in.  We were also advised by Judeen Hammans that pt did not need to be tested b/c pt had not been out of the country.  Ofc mgr checked and PPE has yet to be delivered to the office. PT advised to wear mask and to go to ER for further testing.  Pt was agreeable to the treatment plan.

## 2018-05-11 ENCOUNTER — Telehealth: Payer: Self-pay

## 2018-05-11 NOTE — Telephone Encounter (Signed)
I left the pt a message that Dr. Baird Cancer wanted to check to see how the patient is feeling.

## 2018-05-16 ENCOUNTER — Encounter: Payer: Self-pay | Admitting: Internal Medicine

## 2018-06-13 ENCOUNTER — Ambulatory Visit: Payer: Medicare HMO | Admitting: Cardiovascular Disease

## 2018-07-19 ENCOUNTER — Other Ambulatory Visit: Payer: Self-pay | Admitting: Internal Medicine

## 2018-08-27 DIAGNOSIS — Z20828 Contact with and (suspected) exposure to other viral communicable diseases: Secondary | ICD-10-CM | POA: Diagnosis not present

## 2018-08-29 ENCOUNTER — Telehealth: Payer: Self-pay | Admitting: Cardiovascular Disease

## 2018-08-29 DIAGNOSIS — Z01419 Encounter for gynecological examination (general) (routine) without abnormal findings: Secondary | ICD-10-CM | POA: Diagnosis not present

## 2018-08-29 DIAGNOSIS — Z124 Encounter for screening for malignant neoplasm of cervix: Secondary | ICD-10-CM | POA: Diagnosis not present

## 2018-08-29 DIAGNOSIS — R102 Pelvic and perineal pain: Secondary | ICD-10-CM | POA: Diagnosis not present

## 2018-08-29 DIAGNOSIS — D259 Leiomyoma of uterus, unspecified: Secondary | ICD-10-CM | POA: Diagnosis not present

## 2018-08-29 NOTE — Telephone Encounter (Signed)
Returned call to patient she stated her PCP wanted her to have a calcium score.Stated when she called to verify she was told she has a cardiac ct scheduled 7/28 at 9:30 am.Stated she will let PCP know.She will keep appointment as planned.

## 2018-08-29 NOTE — Telephone Encounter (Signed)
New message:    Patient would like to have a sooner CT. Please call patient if that is ok.

## 2018-09-15 ENCOUNTER — Other Ambulatory Visit: Payer: Self-pay | Admitting: Cardiovascular Disease

## 2018-09-19 ENCOUNTER — Other Ambulatory Visit: Payer: Self-pay

## 2018-09-19 ENCOUNTER — Ambulatory Visit (INDEPENDENT_AMBULATORY_CARE_PROVIDER_SITE_OTHER)
Admission: RE | Admit: 2018-09-19 | Discharge: 2018-09-19 | Disposition: A | Discharge status: Home / Self Care | Payer: Self-pay | Source: Ambulatory Visit | Attending: Cardiovascular Disease | Admitting: Cardiovascular Disease

## 2018-09-19 DIAGNOSIS — I10 Essential (primary) hypertension: Secondary | ICD-10-CM

## 2018-09-19 DIAGNOSIS — Z8249 Family history of ischemic heart disease and other diseases of the circulatory system: Secondary | ICD-10-CM

## 2018-09-19 DIAGNOSIS — E785 Hyperlipidemia, unspecified: Secondary | ICD-10-CM

## 2018-09-25 ENCOUNTER — Other Ambulatory Visit: Payer: Medicare HMO

## 2018-09-26 ENCOUNTER — Encounter: Payer: Self-pay | Admitting: Internal Medicine

## 2018-09-26 ENCOUNTER — Ambulatory Visit (INDEPENDENT_AMBULATORY_CARE_PROVIDER_SITE_OTHER): Payer: Medicare HMO

## 2018-09-26 ENCOUNTER — Other Ambulatory Visit: Payer: Self-pay

## 2018-09-26 ENCOUNTER — Ambulatory Visit (INDEPENDENT_AMBULATORY_CARE_PROVIDER_SITE_OTHER): Payer: Medicare HMO | Admitting: Internal Medicine

## 2018-09-26 VITALS — BP 114/62 | HR 70 | Temp 98.7°F | Ht 61.0 in | Wt 148.2 lb

## 2018-09-26 DIAGNOSIS — I129 Hypertensive chronic kidney disease with stage 1 through stage 4 chronic kidney disease, or unspecified chronic kidney disease: Secondary | ICD-10-CM

## 2018-09-26 DIAGNOSIS — Z23 Encounter for immunization: Secondary | ICD-10-CM | POA: Diagnosis not present

## 2018-09-26 DIAGNOSIS — E785 Hyperlipidemia, unspecified: Secondary | ICD-10-CM | POA: Diagnosis not present

## 2018-09-26 DIAGNOSIS — I10 Essential (primary) hypertension: Secondary | ICD-10-CM

## 2018-09-26 DIAGNOSIS — Z0000 Encounter for general adult medical examination without abnormal findings: Secondary | ICD-10-CM

## 2018-09-26 DIAGNOSIS — R931 Abnormal findings on diagnostic imaging of heart and coronary circulation: Secondary | ICD-10-CM

## 2018-09-26 DIAGNOSIS — N182 Chronic kidney disease, stage 2 (mild): Secondary | ICD-10-CM | POA: Diagnosis not present

## 2018-09-26 LAB — POCT URINALYSIS DIPSTICK
Bilirubin, UA: NEGATIVE
Glucose, UA: NEGATIVE
Ketones, UA: NEGATIVE
Leukocytes, UA: NEGATIVE
Nitrite, UA: NEGATIVE
Protein, UA: NEGATIVE
Spec Grav, UA: 1.025 (ref 1.010–1.025)
Urobilinogen, UA: 0.2 E.U./dL
pH, UA: 6 (ref 5.0–8.0)

## 2018-09-26 LAB — POCT UA - MICROALBUMIN
Albumin/Creatinine Ratio, Urine, POC: <30
Creatinine, POC: 300 mg/dL
Microalbumin Ur, POC: 30 mg/L

## 2018-09-26 MED ORDER — TETANUS-DIPHTH-ACELL PERTUSSIS 5-2.5-18.5 LF-MCG/0.5 IM SUSP: 0.5000 mL | Freq: Once | INTRAMUSCULAR | 0 refills | Status: AC

## 2018-09-26 MED ORDER — PREVNAR 13 IM SUSP: 0.5000 mL | INTRAMUSCULAR | 0 refills | Status: AC

## 2018-09-26 NOTE — Progress Notes (Signed)
Subjective:     Patient ID: Virginia Delgado , female    DOB: 1945/11/28 , 73 y.o.   MRN: [id number]   Chief Complaint  Patient presents with  . Annual Exam  . Hypertension    HPI  She is here today for a full physical examination.  She is seen by Dr. Mancel Bale for her GYN care.   Hypertension This is a chronic problem. The current episode started more than 1 year ago. The problem has been gradually improving since onset. The problem is controlled. Pertinent negatives include no blurred vision, chest pain, palpitations or shortness of breath. Risk factors for coronary artery disease include dyslipidemia and post-menopausal state. Past treatments include angiotensin blockers and calcium channel blockers. The current treatment provides moderate improvement. Hypertensive end-organ damage includes kidney disease.     Past Medical History:  Diagnosis Date  . Dermatitis   . Hx of hepatitis C   . Hypertension      Family History  Problem Relation Age of Onset  . Cancer Mother        breast  . Heart disease Father   . Cancer Sister        sarcoma  . Hypertension Brother   . Ovarian cancer Maternal Grandmother   . Hypertension Sister      Current Outpatient Medications:  .  amLODipine (NORVASC) 10 MG tablet, TAKE 1 TABLET BY MOUTH EVERY DAY, Disp: 90 tablet, Rfl: 1 .  amLODipine (NORVASC) 5 MG tablet, Take 1 tablet (5 mg total) by mouth daily., Disp: 90 tablet, Rfl: 3 .  aspirin 81 MG tablet, Take 81 mg by mouth daily., Disp: , Rfl:  .  calcium carbonate (OSCAL) 1500 (600 Ca) MG TABS tablet, Take 600 mg of elemental calcium by mouth daily., Disp: , Rfl:  .  cholecalciferol (VITAMIN D) 1000 units tablet, Take 1,000 Units by mouth daily., Disp: , Rfl:  .  co-enzyme Q-10 50 MG capsule, Take 50 mg by mouth daily., Disp: , Rfl:  .  losartan-hydrochlorothiazide (HYZAAR) 50-12.5 MG tablet, TAKE 1 TABLET BY MOUTH EVERY DAY, Disp: 90 tablet, Rfl: 1 .  magnesium 30 MG tablet, Take 30 mg by mouth  2 (two) times daily., Disp: , Rfl:  .  NONI, MORINDA CITRIFOLIA, PO, Take 1 capsule by mouth daily., Disp: , Rfl:  .  Omega-3 Fatty Acids (FISH OIL) 1200 MG CAPS, Take 1 capsule by mouth daily., Disp: , Rfl:  .  pneumococcal 13-valent conjugate vaccine (PREVNAR 13) SUSP injection, Inject 0.5 mLs into the muscle tomorrow at 10 am for 1 dose., Disp: 0.5 mL, Rfl: 0 .  Tdap (BOOSTRIX) 5-2.5-18.5 LF-MCG/0.5 injection, Inject 0.5 mLs into the muscle once for 1 dose., Disp: 0.5 mL, Rfl: 0   Allergies  Allergen Reactions  . Contrast Media [Iodinated Diagnostic Agents] Swelling    Of face, especially eyes  . Gadolinium Derivatives Other (See Comments)    Felt extremities go numb briefly as contrast injected.  Had cough (bronchospasm?) briefly after scan.  Resolved on own without treatment.  07/13/15  . Lidocaine Other (See Comments)    Facial swelling after dental procedure  . Latex Rash and Other (See Comments)    and irritation     Review of Systems  Constitutional: Negative.   HENT: Negative.   Eyes: Negative.  Negative for blurred vision.  Respiratory: Negative.  Negative for shortness of breath.   Cardiovascular: Negative.  Negative for chest pain and palpitations.  Endocrine: Negative.   Genitourinary: Negative.  Musculoskeletal: Negative.   Skin: Negative.   Allergic/Immunologic: Negative.   Neurological: Negative.   Hematological: Negative.   Psychiatric/Behavioral: Negative.      Today's Vitals   09/26/18 0914  BP: 114/62  Pulse: 70  Temp: 98.7 F (37.1 C)  TempSrc: Oral  Weight: 148 lb 3.2 oz (67.2 kg)  Height: 5' 1" (1.549 m)  PainSc: 0-No pain   Body mass index is 28 kg/m.   Objective:  Physical Exam Vitals signs and nursing note reviewed.  Constitutional:      Appearance: Normal appearance.  HENT:     Head: Normocephalic and atraumatic.     Right Ear: Tympanic membrane, ear canal and external ear normal.     Left Ear: Tympanic membrane, ear canal and  external ear normal.     Nose: Nose normal.     Mouth/Throat:     Mouth: Mucous membranes are moist.     Pharynx: Oropharynx is clear.  Eyes:     Extraocular Movements: Extraocular movements intact.     Conjunctiva/sclera: Conjunctivae normal.     Pupils: Pupils are equal, round, and reactive to light.  Neck:     Musculoskeletal: Normal range of motion and neck supple.  Cardiovascular:     Rate and Rhythm: Normal rate and regular rhythm.     Pulses: Normal pulses.     Heart sounds: Normal heart sounds.  Pulmonary:     Effort: Pulmonary effort is normal.     Breath sounds: Normal breath sounds.  Chest:     Breasts: Tanner Score is 5.        Right: Normal. No swelling, bleeding, inverted nipple, mass, nipple discharge or skin change.        Left: Normal. No swelling, bleeding, inverted nipple, mass, nipple discharge or skin change.  Abdominal:     General: Abdomen is flat. Bowel sounds are normal.     Palpations: Abdomen is soft.  Genitourinary:    Comments: deferred Musculoskeletal: Normal range of motion.  Lymphadenopathy:     Upper Body:     Right upper body: No supraclavicular, axillary or pectoral adenopathy.     Left upper body: No supraclavicular, axillary or pectoral adenopathy.  Skin:    General: Skin is warm and dry.  Neurological:     General: No focal deficit present.     Mental Status: She is alert and oriented to person, place, and time.  Psychiatric:        Mood and Affect: Mood normal.        Behavior: Behavior normal.         Assessment And Plan:     1. Routine general medical examination at health care facility  A full exam was performed.  Importance of monthly self breast exams was discussed with the patient. PATIENT HAS BEEN ADVISED TO GET 30-45 MINUTES REGULAR EXERCISE NO LESS THAN FOUR TO FIVE DAYS PER WEEK - BOTH WEIGHTBEARING EXERCISES AND AEROBIC ARE RECOMMENDED.  SHE IS ADVISED TO FOLLOW A HEALTHY DIET WITH AT LEAST SIX FRUITS/VEGGIES PER DAY,  DECREASE INTAKE OF RED MEAT, AND TO INCREASE FISH INTAKE TO TWO DAYS PER WEEK.  MEATS/FISH SHOULD NOT BE FRIED, BAKED OR BROILED IS PREFERABLE.  I SUGGEST WEARING SPF 50 SUNSCREEN ON EXPOSED PARTS AND ESPECIALLY WHEN IN THE DIRECT SUNLIGHT FOR AN EXTENDED PERIOD OF TIME.  PLEASE AVOID FAST FOOD RESTAURANTS AND INCREASE YOUR WATER INTAKE.   2. Hypertensive nephropathy  Chronic, well controlled. She will continue with current  meds. She is encouraged to avoid adding salt to her foods. EKG performed, no new changes noted.   - EKG 12-Lead - SAR CoV2 Serology (COVID 19)AB(IGG)IA - BMP8+EGFR  3. Chronic renal disease, stage II  Chronic, yet stable. She is encouraged to avoid adding salt to her foods.   4. Abnormal Heart Score CT  She inquired about her cardiac CT results. She stated she had test last week, and was advised that she could not get her results because Dr.Shepherd was out of the office. Pt advised of lab results and given copy to read at her leisure. Importance of statin therapy/compliance was discussed with the patient. She reports h/o myalgias on statins she has tried thus far. Pt advised of injectable treatment that may improve her lipid status. Pt advised that it appears she will be referred to [facility] for further discussion of results/treatment plan. All questions were answered to her satisfaction.   5. Hyperlipidemia LDL goal <100  Chronic. I will check fasting lipid panel today. Pt advised statin therapy with reduced dosing schedule can be beneficial.   6. Need for vaccination  Rx for Tdap and Prevnar-13 were sent to her local pharmacy. She was also advised of local COVID-19 vaccine study.         Maximino Greenland, MD    THE PATIENT IS ENCOURAGED TO PRACTICE SOCIAL DISTANCING DUE TO THE COVID-19 PANDEMIC.

## 2018-09-26 NOTE — Patient Instructions (Signed)
Virginia Delgado , Thank you for taking time to come for your Medicare Wellness Visit. I appreciate your ongoing commitment to your health goals. Please review the following plan we discussed and let me know if I can assist you in the future.   Screening recommendations/referrals: Colonoscopy: 10/2017 Mammogram: 01/2018 Bone Density: 09/2013 Recommended yearly ophthalmology/optometry visit for glaucoma screening and checkup Recommended yearly dental visit for hygiene and checkup  Vaccinations: Influenza vaccine: decline Pneumococcal vaccine: 2019 Tdap vaccine: decline Shingles vaccine: 08/2017    Advanced directives: Please bring a copy of your POA (Power of [place]) and/or Living Will to your next appointment.    Conditions/risks identified: overweight  Next appointment: 09/26/2018   Preventive Care 45 Years and Older, Female Preventive care refers to lifestyle choices and visits with your health care provider that can promote health and wellness. What does preventive care include?  A yearly physical exam. This is also called an annual well check.  Dental exams once or twice a year.  Routine eye exams. Ask your health care provider how often you should have your eyes checked.  Personal lifestyle choices, including:  Daily care of your teeth and gums.  Regular physical activity.  Eating a healthy diet.  Avoiding tobacco and drug use.  Limiting alcohol use.  Practicing safe sex.  Taking low-dose aspirin every day.  Taking vitamin and mineral supplements as recommended by your health care provider. What happens during an annual well check? The services and screenings done by your health care provider during your annual well check will depend on your age, overall health, lifestyle risk factors, and family history of disease. Counseling  Your health care provider may ask you questions about your:  Alcohol use.  Tobacco use.  Drug use.  Emotional well-being.  Home and  relationship well-being.  Sexual activity.  Eating habits.  History of falls.  Memory and ability to understand (cognition).  Work and work Statistician.  Reproductive health. Screening  You may have the following tests or measurements:  Height, weight, and BMI.  Blood pressure.  Lipid and cholesterol levels. These may be checked every 5 years, or more frequently if you are over 69 years old.  Skin check.  Lung cancer screening. You may have this screening every year starting at age 37 if you have a 30-pack-year history of smoking and currently smoke or have quit within the past 15 years.  Fecal occult blood test (FOBT) of the stool. You may have this test every year starting at age 34.  Flexible sigmoidoscopy or colonoscopy. You may have a sigmoidoscopy every 5 years or a colonoscopy every 10 years starting at age 27.  Hepatitis C blood test.  Hepatitis B blood test.  Sexually transmitted disease (STD) testing.  Diabetes screening. This is done by checking your blood sugar (glucose) after you have not eaten for a while (fasting). You may have this done every 1-3 years.  Bone density scan. This is done to screen for osteoporosis. You may have this done starting at age 70.  Mammogram. This may be done every 1-2 years. Talk to your health care provider about how often you should have regular mammograms. Talk with your health care provider about your test results, treatment options, and if necessary, the need for more tests. Vaccines  Your health care provider may recommend certain vaccines, such as:  Influenza vaccine. This is recommended every year.  Tetanus, diphtheria, and acellular pertussis (Tdap, Td) vaccine. You may need a Td booster every 10  years.  Zoster vaccine. You may need this after age 60.  Pneumococcal 13-valent conjugate (PCV13) vaccine. One dose is recommended after age 57.  Pneumococcal polysaccharide (PPSV23) vaccine. One dose is recommended after  age 52. Talk to your health care provider about which screenings and vaccines you need and how often you need them. This information is not intended to replace advice given to you by your health care provider. Make sure you discuss any questions you have with your health care provider. Document Released: 03/13/2015 Document Revised: 11/04/2015 Document Reviewed: 12/16/2014 Elsevier Interactive Patient Education  2017 [place] Prevention in the Home Falls can cause injuries. They can happen to people of all ages. There are many things you can do to make your home safe and to help prevent falls. What can I do on the outside of my home?  Regularly fix the edges of walkways and driveways and fix any cracks.  Remove anything that might make you trip as you walk through a door, such as a raised step or threshold.  Trim any bushes or trees on the path to your home.  Use bright outdoor lighting.  Clear any walking paths of anything that might make someone trip, such as rocks or tools.  Regularly check to see if handrails are loose or broken. Make sure that both sides of any steps have handrails.  Any raised decks and porches should have guardrails on the edges.  Have any leaves, snow, or ice cleared regularly.  Use sand or salt on walking paths during winter.  Clean up any spills in your garage right away. This includes oil or grease spills. What can I do in the bathroom?  Use night lights.  Install grab bars by the toilet and in the tub and shower. Do not use towel bars as grab bars.  Use non-skid mats or decals in the tub or shower.  If you need to sit down in the shower, use a plastic, non-slip stool.  Keep the floor dry. Clean up any water that spills on the floor as soon as it happens.  Remove soap buildup in the tub or shower regularly.  Attach bath mats securely with double-sided non-slip rug tape.  Do not have throw rugs and other things on the floor that can  make you trip. What can I do in the bedroom?  Use night lights.  Make sure that you have a light by your bed that is easy to reach.  Do not use any sheets or blankets that are too big for your bed. They should not hang down onto the floor.  Have a firm chair that has side arms. You can use this for support while you get dressed.  Do not have throw rugs and other things on the floor that can make you trip. What can I do in the kitchen?  Clean up any spills right away.  Avoid walking on wet floors.  Keep items that you use a lot in easy-to-reach places.  If you need to reach something above you, use a strong step stool that has a grab bar.  Keep electrical cords out of the way.  Do not use floor polish or wax that makes floors slippery. If you must use wax, use non-skid floor wax.  Do not have throw rugs and other things on the floor that can make you trip. What can I do with my stairs?  Do not leave any items on the stairs.  Make sure that  there are handrails on both sides of the stairs and use them. Fix handrails that are broken or loose. Make sure that handrails are as long as the stairways.  Check any carpeting to make sure that it is firmly attached to the stairs. Fix any carpet that is loose or worn.  Avoid having throw rugs at the top or bottom of the stairs. If you do have throw rugs, attach them to the floor with carpet tape.  Make sure that you have a light switch at the top of the stairs and the bottom of the stairs. If you do not have them, ask someone to add them for you. What else can I do to help prevent falls?  Wear shoes that:  Do not have high heels.  Have rubber bottoms.  Are comfortable and fit you well.  Are closed at the toe. Do not wear sandals.  If you use a stepladder:  Make sure that it is fully opened. Do not climb a closed stepladder.  Make sure that both sides of the stepladder are locked into place.  Ask someone to hold it for you,  if possible.  Clearly mark and make sure that you can see:  Any grab bars or handrails.  First and last steps.  Where the edge of each step is.  Use tools that help you move around (mobility aids) if they are needed. These include:  Canes.  Walkers.  Scooters.  Crutches.  Turn on the lights when you go into a dark area. Replace any light bulbs as soon as they burn out.  Set up your furniture so you have a clear path. Avoid moving your furniture around.  If any of your floors are uneven, fix them.  If there are any pets around you, be aware of where they are.  Review your medicines with your doctor. Some medicines can make you feel dizzy. This can increase your chance of falling. Ask your doctor what other things that you can do to help prevent falls. This information is not intended to replace advice given to you by your health care provider. Make sure you discuss any questions you have with your health care provider. Document Released: 12/11/2008 Document Revised: 07/23/2015 Document Reviewed: 03/21/2014 Elsevier Interactive Patient Education  2017 [employer].

## 2018-09-26 NOTE — Addendum Note (Signed)
Addended by: Glenna Durand E on: 09/26/2018 11:00 AM   Modules accepted: Orders

## 2018-09-26 NOTE — Patient Instructions (Signed)
Health Maintenance, Female Adopting a healthy lifestyle and getting preventive care are important in promoting health and wellness. Ask your health care provider about:  The right schedule for you to have regular tests and exams.  Things you can do on your own to prevent diseases and keep yourself healthy. What should I know about diet, weight, and exercise? Eat a healthy diet   Eat a diet that includes plenty of vegetables, fruits, low-fat dairy products, and lean protein.  Do not eat a lot of foods that are high in solid fats, added sugars, or sodium. Maintain a healthy weight Body mass index (BMI) is used to identify weight problems. It estimates body fat based on height and weight. Your health care provider can help determine your BMI and help you achieve or maintain a healthy weight. Get regular exercise Get regular exercise. This is one of the most important things you can do for your health. Most adults should:  Exercise for at least 150 minutes each week. The exercise should increase your heart rate and make you sweat (moderate-intensity exercise).  Do strengthening exercises at least twice a week. This is in addition to the moderate-intensity exercise.  Spend less time sitting. Even light physical activity can be beneficial. Watch cholesterol and blood lipids Have your blood tested for lipids and cholesterol at 71 years of age, then have this test every 5 years. Have your cholesterol levels checked more often if:  Your lipid or cholesterol levels are high.  You are older than 71 years of age.  You are at high risk for heart disease. What should I know about cancer screening? Depending on your health history and family history, you may need to have cancer screening at various ages. This may include screening for:  Breast cancer.  Cervical cancer.  Colorectal cancer.  Skin cancer.  Lung cancer. What should I know about heart disease, diabetes, and high blood  pressure? Blood pressure and heart disease  High blood pressure causes heart disease and increases the risk of stroke. This is more likely to develop in people who have high blood pressure readings, are of African descent, or are overweight.  Have your blood pressure checked: ? Every 3-5 years if you are 18-39 years of age. ? Every year if you are 40 years old or older. Diabetes Have regular diabetes screenings. This checks your fasting blood sugar level. Have the screening done:  Once every three years after age 40 if you are at a normal weight and have a low risk for diabetes.  More often and at a younger age if you are overweight or have a high risk for diabetes. What should I know about preventing infection? Hepatitis B If you have a higher risk for hepatitis B, you should be screened for this virus. Talk with your health care provider to find out if you are at risk for hepatitis B infection. Hepatitis C Testing is recommended for:  Everyone born from 1945 through 1965.  Anyone with known risk factors for hepatitis C. Sexually transmitted infections (STIs)  Get screened for STIs, including gonorrhea and chlamydia, if: ? You are sexually active and are younger than 71 years of age. ? You are older than 71 years of age and your health care provider tells you that you are at risk for this type of infection. ? Your sexual activity has changed since you were last screened, and you are at increased risk for chlamydia or gonorrhea. Ask your health care provider if   you are at risk.  Ask your health care provider about whether you are at high risk for HIV. Your health care provider may recommend a prescription medicine to help prevent HIV infection. If you choose to take medicine to prevent HIV, you should first get tested for HIV. You should then be tested every 3 months for as long as you are taking the medicine. Pregnancy  If you are about to stop having your period (premenopausal) and  you may become pregnant, seek counseling before you get pregnant.  Take 400 to 800 micrograms (mcg) of folic acid every day if you become pregnant.  Ask for birth control (contraception) if you want to prevent pregnancy. Osteoporosis and menopause Osteoporosis is a disease in which the bones lose minerals and strength with aging. This can result in bone fractures. If you are 65 years old or older, or if you are at risk for osteoporosis and fractures, ask your health care provider if you should:  Be screened for bone loss.  Take a calcium or vitamin D supplement to lower your risk of fractures.  Be given hormone replacement therapy (HRT) to treat symptoms of menopause. Follow these instructions at home: Lifestyle  Do not use any products that contain nicotine or tobacco, such as cigarettes, e-cigarettes, and chewing tobacco. If you need help quitting, ask your health care provider.  Do not use street drugs.  Do not share needles.  Ask your health care provider for help if you need support or information about quitting drugs. Alcohol use  Do not drink alcohol if: ? Your health care provider tells you not to drink. ? You are pregnant, may be pregnant, or are planning to become pregnant.  If you drink alcohol: ? Limit how much you use to 0-1 drink a day. ? Limit intake if you are breastfeeding.  Be aware of how much alcohol is in your drink. In the [place] one drink equals one 12 oz bottle of beer (355 mL), one 5 oz glass of wine (148 mL), or one 1 oz glass of hard liquor (44 mL). General instructions  Schedule regular health, dental, and eye exams.  Stay current with your vaccines.  Tell your health care provider if: ? You often feel depressed. ? You have ever been abused or do not feel safe at home. Summary  Adopting a healthy lifestyle and getting preventive care are important in promoting health and wellness.  Follow your health care provider's instructions about healthy  diet, exercising, and getting tested or screened for diseases.  Follow your health care provider's instructions on monitoring your cholesterol and blood pressure. This information is not intended to replace advice given to you by your health care provider. Make sure you discuss any questions you have with your health care provider. Document Released: 08/30/2010 Document Revised: 02/07/2018 Document Reviewed: 02/07/2018 Elsevier Patient Education  2020 Elsevier Inc.  

## 2018-09-26 NOTE — Progress Notes (Signed)
Subjective:   Mariha Souders is a 73 y.o. female who presents for Medicare Annual (Subsequent) preventive examination.  Review of Systems:  n/a Cardiac Risk Factors include: advanced age (>33men, >72 women);hypertension;sedentary lifestyle     Objective:     Vitals: BP 114/62 (BP Location: Left Arm, Patient Position: Sitting, Cuff Size: Normal)   Pulse 70   Temp 98.7 F (37.1 C) (Oral)   Ht 5\' 1"  (1.549 m)   Wt 148 lb 3.2 oz (67.2 kg)   SpO2 97%   BMI 28.00 kg/m   Body mass index is 28 kg/m.  Advanced Directives 09/26/2018 05/10/2018 06/24/2015  Does Patient Have a Medical Advance Directive? Yes No No  Type of Paramedic of [place];Living will - -  Copy of [place] in Chart? No - copy requested - -  Would patient like information on creating a medical advance directive? - No - Patient declined No - patient declined information    Tobacco Social History   Tobacco Use  Smoking Status Never Smoker  Smokeless Tobacco Never Used     Counseling given: Not Answered   Clinical Intake:  Pre-visit preparation completed: Yes  Pain : No/denies pain     Nutritional Status: BMI 25 -29 Overweight Nutritional Risks: None Diabetes: No  How often do you need to have someone help you when you read instructions, pamphlets, or other written materials from your doctor or pharmacy?: 1 - Never What is the last grade level you completed in school?: master's degree  Interpreter Needed?: No  Information entered by :: NAllen LPN  Past Medical History:  Diagnosis Date  . Dermatitis   . Hx of hepatitis C   . Hypertension    Past Surgical History:  Procedure Laterality Date  . CESAREAN SECTION    . UMBILICAL HERNIA REPAIR     Family History  Problem Relation Age of Onset  . Cancer Mother        breast  . Heart disease Father   . Cancer Sister        sarcoma  . Hypertension Brother   . Ovarian cancer Maternal Grandmother   .  Hypertension Sister    Social History   Socioeconomic History  . Marital status: Married    Spouse name: Not on file  . Number of children: Not on file  . Years of education: Not on file  . Highest education level: Not on file  Occupational History  . Not on file  Social Needs  . Financial resource strain: Not hard at all  . Food insecurity    Worry: Never true    Inability: Never true  . Transportation needs    Medical: No    Non-medical: No  Tobacco Use  . Smoking status: Never Smoker  . Smokeless tobacco: Never Used  Substance and Sexual Activity  . Alcohol use: Yes    Comment: social  . Drug use: No  . Sexual activity: Yes    Birth control/protection: None  Lifestyle  . Physical activity    Days per week: 0 days    Minutes per session: 0 min  . Stress: Not at all  Relationships  . Social Herbalist on phone: Not on file    Gets together: Not on file    Attends religious service: Not on file    Active member of club or organization: Not on file    Attends meetings of clubs or organizations: Not  on file    Relationship status: Not on file  Other Topics Concern  . Not on file  Social History Narrative  . Not on file    Outpatient Encounter Medications as of 09/26/2018  Medication Sig  . amLODipine (NORVASC) 10 MG tablet TAKE 1 TABLET BY MOUTH EVERY DAY  . aspirin 81 MG tablet Take 81 mg by mouth daily.  . cholecalciferol (VITAMIN D) 1000 units tablet Take 1,000 Units by mouth daily.  Marland Kitchen losartan-hydrochlorothiazide (HYZAAR) 50-12.5 MG tablet TAKE 1 TABLET BY MOUTH EVERY DAY  . magnesium 30 MG tablet Take 30 mg by mouth 2 (two) times daily.  . NONI, MORINDA CITRIFOLIA, PO Take 1 capsule by mouth daily.  Marland Kitchen amLODipine (NORVASC) 5 MG tablet Take 1 tablet (5 mg total) by mouth daily.  . calcium carbonate (OSCAL) 1500 (600 Ca) MG TABS tablet Take 600 mg of elemental calcium by mouth daily.  Marland Kitchen co-enzyme Q-10 50 MG capsule Take 50 mg by mouth daily.  .  Omega-3 Fatty Acids (FISH OIL) 1200 MG CAPS Take 1 capsule by mouth daily.   No facility-administered encounter medications on file as of 09/26/2018.     Activities of Daily Living In your present state of health, do you have any difficulty performing the following activities: 09/26/2018  Hearing? N  Vision? N  Difficulty concentrating or making decisions? N  Walking or climbing stairs? N  Dressing or bathing? N  Doing errands, shopping? N  Preparing Food and eating ? N  Using the Toilet? N  In the past six months, have you accidently leaked urine? N  Do you have problems with loss of bowel control? N  Managing your Medications? N  Managing your Finances? N  Housekeeping or managing your Housekeeping? N  Some recent data might be hidden    Patient Care Team: Glendale Chard, MD as PCP - General (Internal Medicine)    Assessment:   This is a routine wellness examination for Kenyanna.  Exercise Activities and Dietary recommendations Current Exercise Habits: The patient does not participate in regular exercise at present  Goals    . Patient Stated     09/26/2018, stay healthy       Fall Risk Fall Risk  09/26/2018 03/12/2018 01/19/2018  Falls in the past year? 0 0 0  Risk for fall due to : Medication side effect - -  Follow up Falls evaluation completed;Education provided;Falls prevention discussed - -   Is the patient's home free of loose throw rugs in walkways, pet beds, electrical cords, etc?   yes      Grab bars in the bathroom? no      Handrails on the stairs?   yes      Adequate lighting?   yes  Timed Get Up and Go performed: n/a  Depression Screen PHQ 2/9 Scores 09/26/2018 03/12/2018 01/19/2018  PHQ - 2 Score 0 0 0  PHQ- 9 Score 1 - -     Cognitive Function     6CIT Screen 09/26/2018  What Year? 0 points  What month? 0 points  What time? 0 points  Count back from 20 0 points  Months in reverse 0 points  Repeat phrase 0 points  Total Score 0     There is  no immunization history on file for this patient.  Qualifies for Shingles Vaccine? yes  Screening Tests Health Maintenance  Topic Date Due  . TETANUS/TDAP  12/17/1966  . PNA vac Low Risk Adult (1 of 2 -  PCV13) 12/16/2012  . INFLUENZA VACCINE  09/29/2018  . MAMMOGRAM  02/13/2020  . COLONOSCOPY  11/03/2027  . DEXA SCAN  Completed  . Hepatitis C Screening  Completed    Cancer Screenings: Lung: Low Dose CT Chest recommended if Age 9-80 years, 30 pack-year currently smoking OR have quit w/in 15years. Patient does not qualify. Breast:  Up to date on Mammogram? Yes   Up to date of Bone Density/Dexa? Yes Colorectal: up to date  Additional Screenings: : Hepatitis C Screening: 07/19/2011     Plan:    6 CIT was 0. Patient wants  To stay healthy.   I have personally reviewed and noted the following in the patient's chart:   . Medical and social history . Use of alcohol, tobacco or illicit drugs  . Current medications and supplements . Functional ability and status . Nutritional status . Physical activity . Advanced directives . List of other physicians . Hospitalizations, surgeries, and ER visits in previous 12 months . Vitals . Screenings to include cognitive, depression, and falls . Referrals and appointments  In addition, I have reviewed and discussed with patient certain preventive protocols, quality metrics, and best practice recommendations. A written personalized care plan for preventive services as well as general preventive health recommendations were provided to patient.     Kellie Simmering, LPN  08/31/5007

## 2018-09-27 LAB — SAR COV2 SEROLOGY (COVID19)AB(IGG),IA: SARS-CoV-2 Ab, IgG: NEGATIVE

## 2018-09-27 LAB — BMP8+EGFR
BUN/Creatinine Ratio: 14 (ref 12–28)
BUN: 15 mg/dL (ref 8–27)
CO2: 24 mmol/L (ref 20–29)
Calcium: 10.5 mg/dL — ABNORMAL HIGH (ref 8.7–10.3)
Chloride: 102 mmol/L (ref 96–106)
Creatinine, Ser: 1.08 mg/dL — ABNORMAL HIGH (ref 0.57–1.00)
GFR calc Af Amer: 60 mL/min/{1.73_m2} (ref >59)
GFR calc non Af Amer: 52 mL/min/{1.73_m2} — ABNORMAL LOW (ref >59)
Glucose: 95 mg/dL (ref 65–99)
Potassium: 3.6 mmol/L (ref 3.5–5.2)
Sodium: 141 mmol/L (ref 134–144)

## 2018-09-29 DIAGNOSIS — R69 Illness, unspecified: Secondary | ICD-10-CM | POA: Diagnosis not present

## 2018-10-02 ENCOUNTER — Encounter: Payer: Self-pay | Admitting: Internal Medicine

## 2018-10-03 ENCOUNTER — Encounter: Payer: Self-pay | Admitting: Internal Medicine

## 2018-10-04 ENCOUNTER — Telehealth: Payer: Self-pay

## 2018-10-04 NOTE — Telephone Encounter (Signed)
I called the pt to see which pneumonia vaccination she received and the pt said that she got the Prevnar 13.  The pt's chart was updated.

## 2018-10-11 DIAGNOSIS — R102 Pelvic and perineal pain: Secondary | ICD-10-CM | POA: Diagnosis not present

## 2018-10-25 ENCOUNTER — Ambulatory Visit (INDEPENDENT_AMBULATORY_CARE_PROVIDER_SITE_OTHER): Payer: Medicare HMO | Admitting: Pharmacist Clinician (PhC)/ Clinical Pharmacy Specialist

## 2018-10-25 ENCOUNTER — Encounter

## 2018-10-25 ENCOUNTER — Other Ambulatory Visit: Payer: Self-pay

## 2018-10-25 VITALS — BP 130/70 | HR 67 | Resp 14 | Ht 60.25 in | Wt 144.6 lb

## 2018-10-25 DIAGNOSIS — E785 Hyperlipidemia, unspecified: Secondary | ICD-10-CM | POA: Diagnosis not present

## 2018-10-25 DIAGNOSIS — G720 Drug-induced myopathy: Secondary | ICD-10-CM | POA: Diagnosis not present

## 2018-10-25 NOTE — Assessment & Plan Note (Signed)
Patient with hyperlipidemia, not at goal with current LDL of 144.  Patient had coronary calcium score of 471 (93rd percentile for age, sex).  She has been unable to tolerate statin drugs due to myalgias.  We will start paperwork for [employer].  Patient is to continue with her current exercise regimen and healthy eating.  Once approved, we will repeat lipid labs in 3 months.

## 2018-10-25 NOTE — Patient Instructions (Signed)
We will start the paperwork to get Repatha approved by your insurance company.  This can take up to 2 weeks, but usually just a few days.  Once approved, if you find the medication to be cost prohibitive, please reach out to the [employer] ([email or web address]).  Your disease state is "hypercholesterolemia".  Evolocumab injection What is this medicine? EVOLOCUMAB (e voe LOK ue mab) is known as a PCSK9 inhibitor. It is used to lower the level of cholesterol in the blood. It may be used alone or in combination with other cholesterol-lowering drugs. This drug may also be used to reduce the risk of heart attack, stroke, and certain types of heart surgery in patients with heart disease. This medicine may be used for other purposes; ask your health care provider or pharmacist if you have questions. COMMON BRAND NAME(S): Repatha What should I tell my health care provider before I take this medicine? They need to know if you have any of these conditions:  an unusual or allergic reaction to evolocumab, other medicines, foods, dyes, or preservatives  pregnant or trying to get pregnant  breast-feeding How should I use this medicine? This medicine is for injection under the skin. You will be taught how to prepare and give this medicine. Use exactly as directed. Take your medicine at regular intervals. Do not take your medicine more often than directed. It is important that you put your used needles and syringes in a special sharps container. Do not put them in a trash can. If you do not have a sharps container, call your pharmacist or health care provider to get one. Talk to your pediatrician regarding the use of this medicine in children. While this drug may be prescribed for children as young as 13 years for selected conditions, precautions do apply. Overdosage: If you think you have taken too much of this medicine contact a poison control center or emergency room at once. NOTE: This  medicine is only for you. Do not share this medicine with others. What if I miss a dose? If you miss a dose, take it as soon as you can if there are more than 7 days until the next scheduled dose, or skip the missed dose and take the next dose according to your original schedule. Do not take double or extra doses. What may interact with this medicine? Interactions are not expected. This list may not describe all possible interactions. Give your health care provider a list of all the medicines, herbs, non-prescription drugs, or dietary supplements you use. Also tell them if you smoke, drink alcohol, or use illegal drugs. Some items may interact with your medicine. What should I watch for while using this medicine? You may need blood work while you are taking this medicine. What side effects may I notice from receiving this medicine? Side effects that you should report to your doctor or health care professional as soon as possible:  allergic reactions like skin rash, itching or hives, swelling of the face, lips, or tongue  signs and symptoms of high blood sugar such as dizziness; dry mouth; dry skin; fruity breath; nausea; stomach pain; increased hunger or thirst; increased urination  signs and symptoms of infection like fever or chills; cough; sore throat; pain or trouble passing urine Side effects that usually do not require medical attention (report to your doctor or health care professional if they continue or are bothersome):  diarrhea  nausea  muscle pain  pain, redness, or irritation at site where injected This  list may not describe all possible side effects. Call your doctor for medical advice about side effects. You may report side effects to FDA at 1-800-FDA-1088. Where should I keep my medicine? Keep out of the reach of children. You will be instructed on how to store this medicine. Throw away any unused medicine after the expiration date on the label. NOTE: This sheet is a  summary. It may not cover all possible information. If you have questions about this medicine, talk to your doctor, pharmacist, or health care provider.  2020 Elsevier/Gold Standard (2016-09-26 13:31:00)

## 2018-10-25 NOTE — Progress Notes (Signed)
10/25/2018 Virginia Delgado 26-Aug-1945 [id number]   HPI:  Virginia Delgado is a 73 y.o. female patient of Dr Oval Linsey, who presents today for a lipid clinic evaluation.  In addition to hyperlipidemia, his medical history is significant for hypertension, CKD (stage 2) and hepatitis C.   She recently had a cardiac CT done and was found to have a coronary calcium score of 471, which was 93rd percentile for age and sex matched control.    She is doing well today, has no complaints about her current medications.  She has tried multiple statin drugs over the years, however each has caused her to have myalgias, to the point where she could not continue medication.   Current Medications: none  Cholesterol Goals: LDL < 100   Intolerant/previously tried: rosuvastatin, lovastatin, simvastatin - all caused myalgias  Family history:              Father died from [place] at 63             Mother living at 41 breast cancer survivor;              1 sister passed at 24 from sarcoma             Brothers  - 1 of 2 with hypertension  Diet: mostly home cooked meals, mostly plant based diet with seafood and occasional chicken  Exercise:  Walking at least 3-5 days per week, 45 minutes  Labs: 02/2018:  TC 243, TG 115, HDL 76, LDL 144    Current Outpatient Medications  Medication Sig Dispense Refill  . amLODipine (NORVASC) 10 MG tablet TAKE 1 TABLET BY MOUTH EVERY DAY 90 tablet 1  . cholecalciferol (VITAMIN D) 1000 units tablet Take 1,000 Units by mouth daily.    Marland Kitchen co-enzyme Q-10 50 MG capsule Take 50 mg by mouth daily.    Marland Kitchen losartan-hydrochlorothiazide (HYZAAR) 50-12.5 MG tablet TAKE 1 TABLET BY MOUTH EVERY DAY 90 tablet 1  . magnesium 30 MG tablet Take 30 mg by mouth 2 (two) times daily.    . NONI, MORINDA CITRIFOLIA, PO Take 1 capsule by mouth daily.    Marland Kitchen amLODipine (NORVASC) 5 MG tablet Take 1 tablet (5 mg total) by mouth daily. 90 tablet 3  . aspirin 81 MG tablet Take 81 mg by mouth daily.    . calcium carbonate  (OSCAL) 1500 (600 Ca) MG TABS tablet Take 600 mg of elemental calcium by mouth daily.    . Omega-3 Fatty Acids (FISH OIL) 1200 MG CAPS Take 1 capsule by mouth daily.     No current facility-administered medications for this visit.     Allergies  Allergen Reactions  . Contrast Media [Iodinated Diagnostic Agents] Swelling    Of face, especially eyes  . Gadolinium Derivatives Other (See Comments)    Felt extremities go numb briefly as contrast injected.  Had cough (bronchospasm?) briefly after scan.  Resolved on own without treatment.  07/13/15  . Lidocaine Other (See Comments)    Facial swelling after dental procedure  . Latex Rash and Other (See Comments)    and irritation    Past Medical History:  Diagnosis Date  . Dermatitis   . Hx of hepatitis C   . Hypertension     Blood pressure 130/70, pulse 67, resp. rate 14, height 5' 0.25" (1.53 m), weight 144 lb 9.6 oz (65.6 kg), SpO2 96 %.   Hyperlipidemia LDL goal <100 Patient with hyperlipidemia, not at goal with current LDL of 144.  Patient had coronary calcium score of 471 (93rd percentile for age, sex).  She has been unable to tolerate statin drugs due to myalgias.  We will start paperwork for Repatha [employer].  Patient is to continue with her current exercise regimen and healthy eating.  Once approved, we will repeat lipid labs in 3 months.    Tommy Medal PharmD CPP [facility]

## 2018-11-16 ENCOUNTER — Telehealth: Payer: Self-pay | Admitting: Cardiovascular Disease

## 2018-11-16 NOTE — Telephone Encounter (Signed)
Patient says that she was expecting a call regarding approval of Reptha. Advised that Rip Harbour was out until next week, and message would be sent to her desk to address upon return to office. Verbalized understanding.

## 2018-11-16 NOTE — Telephone Encounter (Signed)
New message   Patient states that she is returning call from [place]. Please call.

## 2018-11-19 ENCOUNTER — Encounter: Payer: Self-pay | Admitting: Internal Medicine

## 2018-11-19 ENCOUNTER — Telehealth: Payer: Self-pay

## 2018-11-19 NOTE — Telephone Encounter (Signed)
Will forward to [employer] D working with patient on lipids

## 2018-11-19 NOTE — Telephone Encounter (Signed)
Spoke with patient and informed her that the provider wanted her to start zyrtec for her symptoms. Pt agreed.

## 2018-11-20 NOTE — Telephone Encounter (Signed)
PA sent to insurance 9/21

## 2018-11-21 ENCOUNTER — Other Ambulatory Visit: Payer: Self-pay

## 2018-11-21 ENCOUNTER — Telehealth: Payer: Self-pay

## 2018-11-21 MED ORDER — PRALUENT 150 MG/ML ~~LOC~~ SOAJ: 150.0000 mg | SUBCUTANEOUS | 11 refills | Status: DC

## 2018-11-21 NOTE — Telephone Encounter (Signed)
Called and spoke w/pt regarding praluent instead of repatha due to having [employer] insurance where praluent is on the formulary and the pt voiced understanding

## 2018-11-28 ENCOUNTER — Telehealth: Payer: Self-pay | Admitting: Pharmacist Clinician (PhC)/ Clinical Pharmacy Specialist

## 2018-11-28 NOTE — Telephone Encounter (Signed)
-----   Message from [place], [place] sent at 11/28/2018 12:19 PM EDT ----- Regarding: consider other options Tommy Medal PharmD CPP, Pt not happy about repatha for long term thought a few injections would cure her for life. Please reach out to let her know of anything else that she might would be able to consider. Thanks, [facility] CMA

## 2018-12-03 DIAGNOSIS — Z20828 Contact with and (suspected) exposure to other viral communicable diseases: Secondary | ICD-10-CM | POA: Diagnosis not present

## 2018-12-03 NOTE — Telephone Encounter (Signed)
LMOM; patient to call back and discuss any questions with pharmacist at [phone]

## 2018-12-05 NOTE — Telephone Encounter (Signed)
Have left messages x 3 for patient to return call.  Will close encounter until we hear from her

## 2018-12-14 DIAGNOSIS — R69 Illness, unspecified: Secondary | ICD-10-CM | POA: Diagnosis not present

## 2019-01-08 ENCOUNTER — Other Ambulatory Visit: Payer: Self-pay | Admitting: Internal Medicine

## 2019-02-08 DIAGNOSIS — H5211 Myopia, right eye: Secondary | ICD-10-CM | POA: Diagnosis not present

## 2019-02-08 DIAGNOSIS — H524 Presbyopia: Secondary | ICD-10-CM | POA: Diagnosis not present

## 2019-02-14 DIAGNOSIS — Z803 Family history of malignant neoplasm of breast: Secondary | ICD-10-CM | POA: Diagnosis not present

## 2019-02-14 DIAGNOSIS — Z1231 Encounter for screening mammogram for malignant neoplasm of breast: Secondary | ICD-10-CM | POA: Diagnosis not present

## 2019-02-26 DIAGNOSIS — H2511 Age-related nuclear cataract, right eye: Secondary | ICD-10-CM | POA: Diagnosis not present

## 2019-02-26 DIAGNOSIS — Z803 Family history of malignant neoplasm of breast: Secondary | ICD-10-CM | POA: Diagnosis not present

## 2019-02-26 DIAGNOSIS — Z809 Family history of malignant neoplasm, unspecified: Secondary | ICD-10-CM | POA: Diagnosis not present

## 2019-02-26 DIAGNOSIS — H25043 Posterior subcapsular polar age-related cataract, bilateral: Secondary | ICD-10-CM | POA: Diagnosis not present

## 2019-02-26 DIAGNOSIS — I10 Essential (primary) hypertension: Secondary | ICD-10-CM | POA: Diagnosis not present

## 2019-02-26 DIAGNOSIS — H2513 Age-related nuclear cataract, bilateral: Secondary | ICD-10-CM | POA: Diagnosis not present

## 2019-02-26 DIAGNOSIS — H18413 Arcus senilis, bilateral: Secondary | ICD-10-CM | POA: Diagnosis not present

## 2019-02-26 DIAGNOSIS — Z008 Encounter for other general examination: Secondary | ICD-10-CM | POA: Diagnosis not present

## 2019-02-26 DIAGNOSIS — Z91041 Radiographic dye allergy status: Secondary | ICD-10-CM | POA: Diagnosis not present

## 2019-02-26 DIAGNOSIS — Z8249 Family history of ischemic heart disease and other diseases of the circulatory system: Secondary | ICD-10-CM | POA: Diagnosis not present

## 2019-02-26 DIAGNOSIS — H25013 Cortical age-related cataract, bilateral: Secondary | ICD-10-CM | POA: Diagnosis not present

## 2019-02-26 DIAGNOSIS — Z825 Family history of asthma and other chronic lower respiratory diseases: Secondary | ICD-10-CM | POA: Diagnosis not present

## 2019-02-26 DIAGNOSIS — Z833 Family history of diabetes mellitus: Secondary | ICD-10-CM | POA: Diagnosis not present

## 2019-03-11 ENCOUNTER — Telehealth: Payer: Self-pay

## 2019-03-11 NOTE — Telephone Encounter (Signed)
The pt called and wanted a referral to a eye doctor for a second opinion.  The pt said that she had an eye exam with Dr. Talbert Forest as recommended by her optometrist, the examination was fine but that she had what she would consider a black health experience so she doesn't trust them to do her surgery and that she's found someone else or if dr sanders knows of someone.  The pt said she has cataracts in both eyes, stigmatism, different color and shape of her eyes.  An appt was scheduled for the pt.

## 2019-03-13 ENCOUNTER — Ambulatory Visit (INDEPENDENT_AMBULATORY_CARE_PROVIDER_SITE_OTHER): Payer: Medicare HMO | Admitting: Internal Medicine

## 2019-03-13 ENCOUNTER — Encounter: Payer: Self-pay | Admitting: Internal Medicine

## 2019-03-13 ENCOUNTER — Other Ambulatory Visit: Payer: Self-pay

## 2019-03-13 VITALS — BP 116/74 | HR 78 | Temp 98.5°F | Ht 60.25 in | Wt 149.4 lb

## 2019-03-13 DIAGNOSIS — E7800 Pure hypercholesterolemia, unspecified: Secondary | ICD-10-CM

## 2019-03-13 DIAGNOSIS — N182 Chronic kidney disease, stage 2 (mild): Secondary | ICD-10-CM

## 2019-03-13 DIAGNOSIS — I129 Hypertensive chronic kidney disease with stage 1 through stage 4 chronic kidney disease, or unspecified chronic kidney disease: Secondary | ICD-10-CM

## 2019-03-13 DIAGNOSIS — Z6828 Body mass index (BMI) 28.0-28.9, adult: Secondary | ICD-10-CM | POA: Diagnosis not present

## 2019-03-13 DIAGNOSIS — H2589 Other age-related cataract: Secondary | ICD-10-CM | POA: Diagnosis not present

## 2019-03-13 DIAGNOSIS — E663 Overweight: Secondary | ICD-10-CM | POA: Diagnosis not present

## 2019-03-13 NOTE — Patient Instructions (Signed)
COVID-19 Vaccine Information can be found at: [email or web address] For questions related to vaccine distribution or appointments, please email [email or web address] or call [phone].    

## 2019-03-13 NOTE — Progress Notes (Signed)
This visit occurred during the SARS-CoV-2 public health emergency.  Safety protocols were in place, including screening questions prior to the visit, additional usage of staff PPE, and extensive cleaning of exam room while observing appropriate contact time as indicated for disinfecting solutions.  Subjective:     Patient ID: Virginia Delgado , female    DOB: 26-Mar-1945 , 74 y.o.   MRN: [id number]   Chief Complaint  Patient presents with  . referral to eye doctor  . Hypertension    HPI  She is here today for referral to a new eye doctor. She was seen by Dr. Justice Britain (sp?) and was advised that she had cataracts. She did not have a good experience at her first visit, so she prefers referral to a new provider. She was advised by a colleague that she should see Dr. Herbert Deaner.   Hypertension This is a chronic problem. The current episode started more than 1 year ago. The problem has been gradually improving since onset. The problem is controlled. Pertinent negatives include no blurred vision, chest pain, palpitations or shortness of breath. The current treatment provides moderate improvement. Compliance problems include exercise.      Past Medical History:  Diagnosis Date  . Dermatitis   . Hx of hepatitis C   . Hypertension      Family History  Problem Relation Age of Onset  . Cancer Mother        breast  . Heart disease Father   . Cancer Sister        sarcoma  . Hypertension Brother   . Ovarian cancer Maternal Grandmother   . Hypertension Sister      Current Outpatient Medications:  .  Alirocumab (PRALUENT) 150 MG/ML SOAJ, Inject 150 mg into the skin every 14 (fourteen) days., Disp: 2 pen, Rfl: 11 .  amLODipine (NORVASC) 10 MG tablet, TAKE 1 TABLET BY MOUTH EVERY DAY, Disp: 90 tablet, Rfl: 0 .  aspirin 81 MG tablet, Take 81 mg by mouth daily., Disp: , Rfl:  .  calcium carbonate (OSCAL) 1500 (600 Ca) MG TABS tablet, Take 600 mg of elemental calcium by mouth daily., Disp: , Rfl:  .   cholecalciferol (VITAMIN D) 1000 units tablet, Take 1,000 Units by mouth daily., Disp: , Rfl:  .  co-enzyme Q-10 50 MG capsule, Take 50 mg by mouth daily., Disp: , Rfl:  .  losartan-hydrochlorothiazide (HYZAAR) 50-12.5 MG tablet, TAKE 1 TABLET BY MOUTH EVERY DAY, Disp: 90 tablet, Rfl: 1 .  magnesium 30 MG tablet, Take 30 mg by mouth 2 (two) times daily., Disp: , Rfl:  .  NONI, MORINDA CITRIFOLIA, PO, Take 1 capsule by mouth daily., Disp: , Rfl:  .  Omega-3 Fatty Acids (FISH OIL) 1200 MG CAPS, Take 1 capsule by mouth daily., Disp: , Rfl:    Allergies  Allergen Reactions  . Contrast Media [Iodinated Diagnostic Agents] Swelling    Of face, especially eyes  . Gadolinium Derivatives Other (See Comments)    Felt extremities go numb briefly as contrast injected.  Had cough (bronchospasm?) briefly after scan.  Resolved on own without treatment.  07/13/15  . Lidocaine Other (See Comments)    Facial swelling after dental procedure  . Latex Rash and Other (See Comments)    and irritation     Review of Systems  Constitutional: Negative.   Eyes: Negative for blurred vision.  Respiratory: Negative.  Negative for shortness of breath.   Cardiovascular: Negative.  Negative for chest pain and palpitations.  Gastrointestinal: Negative.   Genitourinary: Negative for urgency.  Neurological: Negative.   Psychiatric/Behavioral: Negative.      Today's Vitals   03/13/19 1409  BP: 116/74  Pulse: 78  Temp: 98.5 F (36.9 C)  TempSrc: Oral  Weight: 149 lb 6.4 oz (67.8 kg)  Height: 5' 0.25" (1.53 m)  PainSc: 0-No pain   Body mass index is 28.94 kg/m.   Objective:  Physical Exam Vitals and nursing note reviewed.  Constitutional:      Appearance: Normal appearance.  HENT:     Head: Normocephalic and atraumatic.  Cardiovascular:     Rate and Rhythm: Normal rate and regular rhythm.     Heart sounds: Normal heart sounds.  Pulmonary:     Effort: Pulmonary effort is normal.     Breath sounds:  Normal breath sounds.  Skin:    General: Skin is warm.  Neurological:     General: No focal deficit present.     Mental Status: She is alert.  Psychiatric:        Mood and Affect: Mood normal.        Behavior: Behavior normal.         Assessment And Plan:     1. Other age-related cataract of both eyes  I will refer her to Dr. Herbert Deaner as requested. I will also request her most recent records from her previous ophthalmologist. I will be sure to forward these to Dr. Herbert Deaner.   - Ambulatory referral to Ophthalmology  2. Hypertensive nephropathy  Chronic, well controlled. She will continue with current meds. She is encouraged to avoid adding salt to her foods. I will check renal function today. She will rto in six months for her next AWV/physical exam.   - CMP14+EGFR  3. Chronic renal disease, stage II  Chronic, yet stable.  She is encouraged to stay well hydrated.   4. Pure hypercholesterolemia  Chronic, I will check non-fasting lipid panel today. She was planning to start Praluent, as per Cardiology; however, she has decided that she does not want to give herself injections for a prolonged period of time. Pt advised there are other oral alternatives to statins. I will make further recommendations once her labs are available for review.   - Lipid panel  5. Overweight with body mass index (BMI) of 28 to 28.9 in adult  She is encouraged to strive for at least 150 minutes of exercise per week, even though she is in healthy weight range for her age group.   Maximino Greenland, MD    THE PATIENT IS ENCOURAGED TO PRACTICE SOCIAL DISTANCING DUE TO THE COVID-19 PANDEMIC.

## 2019-03-14 LAB — CMP14+EGFR
ALT: 14 IU/L (ref 0–32)
AST: 18 IU/L (ref 0–40)
Albumin/Globulin Ratio: 1.6 (ref 1.2–2.2)
Albumin: 4.5 g/dL (ref 3.7–4.7)
Alkaline Phosphatase: 87 IU/L (ref 39–117)
BUN/Creatinine Ratio: 13 (ref 12–28)
BUN: 13 mg/dL (ref 8–27)
Bilirubin Total: 1 mg/dL (ref 0.0–1.2)
CO2: 26 mmol/L (ref 20–29)
Calcium: 10.6 mg/dL — ABNORMAL HIGH (ref 8.7–10.3)
Chloride: 101 mmol/L (ref 96–106)
Creatinine, Ser: 1.02 mg/dL — ABNORMAL HIGH (ref 0.57–1.00)
GFR calc Af Amer: 64 mL/min/{1.73_m2} (ref >59)
GFR calc non Af Amer: 55 mL/min/{1.73_m2} — ABNORMAL LOW (ref >59)
Globulin, Total: 2.9 g/dL (ref 1.5–4.5)
Glucose: 100 mg/dL — ABNORMAL HIGH (ref 65–99)
Potassium: 4 mmol/L (ref 3.5–5.2)
Sodium: 141 mmol/L (ref 134–144)
Total Protein: 7.4 g/dL (ref 6.0–8.5)

## 2019-03-14 LAB — LIPID PANEL
Chol/HDL Ratio: 3.4 ratio (ref 0.0–4.4)
Cholesterol, Total: 242 mg/dL — ABNORMAL HIGH (ref 100–199)
HDL: 72 mg/dL (ref >39)
LDL Chol Calc (NIH): 136 mg/dL — ABNORMAL HIGH (ref 0–99)
Triglycerides: 194 mg/dL — ABNORMAL HIGH (ref 0–149)
VLDL Cholesterol Cal: 34 mg/dL (ref 5–40)

## 2019-04-01 ENCOUNTER — Ambulatory Visit: Payer: Medicare HMO | Admitting: Internal Medicine

## 2019-04-05 ENCOUNTER — Telehealth (INDEPENDENT_AMBULATORY_CARE_PROVIDER_SITE_OTHER): Payer: Medicare HMO | Admitting: Cardiovascular Disease

## 2019-04-05 ENCOUNTER — Encounter: Payer: Self-pay | Admitting: Cardiovascular Disease

## 2019-04-05 VITALS — BP 122/80 | HR 69 | Temp 97.2°F | Ht 60.25 in | Wt 145.0 lb

## 2019-04-05 DIAGNOSIS — E7800 Pure hypercholesterolemia, unspecified: Secondary | ICD-10-CM | POA: Diagnosis not present

## 2019-04-05 DIAGNOSIS — I2584 Coronary atherosclerosis due to calcified coronary lesion: Secondary | ICD-10-CM

## 2019-04-05 DIAGNOSIS — E785 Hyperlipidemia, unspecified: Secondary | ICD-10-CM

## 2019-04-05 DIAGNOSIS — I10 Essential (primary) hypertension: Secondary | ICD-10-CM | POA: Diagnosis not present

## 2019-04-05 DIAGNOSIS — I2510 Atherosclerotic heart disease of native coronary artery without angina pectoris: Secondary | ICD-10-CM | POA: Diagnosis not present

## 2019-04-05 HISTORY — DX: Atherosclerotic heart disease of native coronary artery without angina pectoris: I25.10

## 2019-04-05 NOTE — Progress Notes (Signed)
Virtual Visit via Video Note   This visit type was conducted due to national recommendations for restrictions regarding the COVID-19 Pandemic (e.g. social distancing) in an effort to limit this patient's exposure and mitigate transmission in our community.  Due to her co-morbid illnesses, this patient is at least at moderate risk for complications without adequate follow up.  This format is felt to be most appropriate for this patient at this time.  All issues noted in this document were discussed and addressed.  A limited physical exam was performed with this format.  Please refer to the patient's chart for her consent to telehealth for [facility].   Date:  04/05/2019   ID:  Meganne Wolin, DOB May 17, 1945, MRN [id number]  Patient Location: Home Provider Location: Home  PCP:  Glendale Chard, MD  Cardiologist:  No primary care provider on file.  Electrophysiologist:  None   Evaluation Performed:  Follow-Up Visit  Chief Complaint: Hyperlipidemia  History of Present Illness:    Nohemy Dacquisto is a 74 y.o. female with asymptomatic coronary calcification, hypertension, hyperlipidemia and Hepatitis C s/p treatment who presents for follow up. She was initially seen 06/2015 for  management of hypertension.   She was previously treated with HCTZ and metoprolol and her BP was well-controlled until 05/2015.  Ms. Yuille was seen in the ED on 4/27.  Her BP was in the 200s/110s.  She was started on amlodipine and received ativan.  Head CT was negative for stroke.  She followed up with her PCP, Dr. Glendale Chard, who started her on Edarbi and amlodipine.  She was seen in clinic 07/06/15, at which time metoprolol was switched to carvedilol.  Renal artery Doppler was negative for stenosis. She also had a exercise Cardiolite that was negative for ischemia.  She achieved 8.5 METS. Carvedilol was stopped due to fatigue and amlodipine was increased.  She saw Dr. Baird Cancer and was switched from amlodipine to [facility] because  she thought her body was immune to amlodipine.  At her last appointment Ms. Stinson was switched to HCTZ/losartan.  She followed up with our pharmacist 09/2017 and her BP was above goal so amlodipine was added back.   Lately Ms. Vanderhoof reports that her blood pressure has been well-controlled.  She had a coronary calcium score on 08/2018 that was 93rd percentile for age and gender.  She has been hesitant to start statins.  She was referred to our pharmacist and Repatha was ordered.  However after doing some research she decided she did not want to take it.  She has been really working on her diet and has started some natural supplements.  Since her last appointment she reported symptoms of difficulty sleeping and snoring.  She was referred for sleep study 03/2018 that was normal.   The patient does not have symptoms concerning for COVID-19 infection (fever, chills, cough, or new shortness of breath).    Past Medical History:  Diagnosis Date  . Coronary artery calcification 04/05/2019   Calcium score 93rd percentile 08/2018.  Marland Kitchen Dermatitis   . Hx of hepatitis C   . Hypertension    Past Surgical History:  Procedure Laterality Date  . CESAREAN SECTION    . UMBILICAL HERNIA REPAIR       Current Meds  Medication Sig  . amLODipine (NORVASC) 10 MG tablet TAKE 1 TABLET BY MOUTH EVERY DAY  . aspirin 81 MG tablet Take 81 mg by mouth daily.  . cholecalciferol (VITAMIN D) 1000 units tablet Take 1,000 Units  by mouth daily.  Marland Kitchen co-enzyme Q-10 50 MG capsule Take 50 mg by mouth daily.  Marland Kitchen losartan-hydrochlorothiazide (HYZAAR) 50-12.5 MG tablet TAKE 1 TABLET BY MOUTH EVERY DAY  . magnesium 30 MG tablet Take 30 mg by mouth daily.   . NONI, MORINDA CITRIFOLIA, PO Take 1 capsule by mouth daily.  . Plant Sterols and Stanols (CHOLESTOFF PO) Take by mouth daily.     Allergies:   Contrast media [iodinated diagnostic agents], Gadolinium derivatives, Lidocaine, and Latex   Social History   Tobacco Use  . Smoking  status: Never Smoker  . Smokeless tobacco: Never Used  Substance Use Topics  . Alcohol use: Yes    Comment: social  . Drug use: No     Family Hx: The patient's family history includes Cancer in her mother and sister; Heart disease in her father; Hypertension in her brother and sister; Ovarian cancer in her maternal grandmother.  ROS:   Please see the history of present illness.    All other systems reviewed and are negative.   Prior CV studies:   The following studies were reviewed today:  Renal artery ultrasound 07/09/15: Normal renal arteries  Lexiscan Cardiolite 07/17/15:  Nuclear stress EF: 64%.  The left ventricular ejection fraction is normal (55-65%).  Blood pressure demonstrated a hypertensive response to exercise.  ST segment depression was noted during stress in the II, III, aVF, V5 and V6 leads.  The study is normal  Labs/Other Tests and Data Reviewed:    EKG:  No ECG reviewed.  Recent Labs: 05/10/2018: Hemoglobin 12.9; Platelets 200 03/13/2019: ALT 14; BUN 13; Creatinine, Ser 1.02; Potassium 4.0; Sodium 141   Recent Lipid Panel Lab Results  Component Value Date/Time   CHOL 242 (H) 03/13/2019 02:45 PM   TRIG 194 (H) 03/13/2019 02:45 PM   HDL 72 03/13/2019 02:45 PM   CHOLHDL 3.4 03/13/2019 02:45 PM   LDLCALC 136 (H) 03/13/2019 02:45 PM   Recent Labs: 03/21/2018: ALT 14; BUN 10; Creatinine, Ser 1.02; Potassium 3.8; Sodium 143   05/20/15: Hemoglobin A1c 5.7% Total cholesterol 238, tri 96, HDL 80, LDL 139 AST 19 ALT 14  09/06/17:  Total cholesterol 254, triglycerides 75, HDL 85, LDL 154 Hemoglobin 13.6 Creatinine 1.04 Potassium 3.9  Wt Readings from Last 3 Encounters:  04/05/19 145 lb (65.8 kg)  03/13/19 149 lb 6.4 oz (67.8 kg)  10/25/18 144 lb 9.6 oz (65.6 kg)     Objective:    Vital Signs:  BP 122/80   Pulse 69   Temp (!) 97.2 F (36.2 C)   Ht 5' 0.25" (1.53 m)   Wt 145 lb (65.8 kg)   BMI 28.08 kg/m    VITAL SIGNS:  reviewed GEN:   no acute distress EYES:  sclerae anicteric, EOMI - Extraocular Movements Intact RESPIRATORY:  normal respiratory effort, symmetric expansion CARDIOVASCULAR:  no peripheral edema SKIN:  no rash, lesions or ulcers. MUSCULOSKELETAL:  no obvious deformities. NEURO:  alert and oriented x 3, no obvious focal deficit PSYCH:  normal affect  ASSESSMENT & PLAN:    # Hypertension: Ms. Reish BP is well-controlled. Continue amlodipine, losartan and HCTZ.   # Chest pain: Stress test was negative for ischemia.  No recurrent chest pain.     #Asymptomatic coronary calcification: # Hyperlipidemia:  She hasn't been interested in re-challenging with a statin.  Calcium score was 93rd percentile.  We discussed trying a PCSK9 inhibitor but she was hesitant as she thought this was going to be 1 injection  that would be curative.  We had a frank discussion about the fact that her calcium score is concerning and puts her at high risk of future events.  We need to be aggressive about her management.  She is interested in trying a plant-based diet, as she already does not eat much meat.  We are going to give this 3 months and repeat fasting lipids.  Her LDL goal is less than 70 if it remains elevated will need to consider a PCSK9 inhibitor or bempedoic acid.   COVID-19 Education: The signs and symptoms of COVID-19 were discussed with the patient and how to seek care for testing (follow up with PCP or arrange E-visit).  The importance of social distancing was discussed today.  Time:   Today, I have spent 25 minutes with the patient with telehealth technology discussing the above problems.     Medication Adjustments/Labs and Tests Ordered: Current medicines are reviewed at length with the patient today.  Concerns regarding medicines are outlined above.   Tests Ordered: Orders Placed This Encounter  Procedures  . Lipid panel  . Comprehensive metabolic panel    Medication Changes: No orders of the defined  types were placed in this encounter.   Follow Up:  Either In Person or Virtual in 3 month(s)  Signed, Skeet Latch, MD  04/05/2019 10:25 AM    [place]

## 2019-04-05 NOTE — Patient Instructions (Addendum)
Medication Instructions:  Your physician recommends that you continue on your current medications as directed. Please refer to the Current Medication list given to you today.  *If you need a refill on your cardiac medications before your next appointment, please call your pharmacy*  Lab Work: FASTING LP/CMET PRIOR TO YOUR FOLLOW UP VISIT  If you have labs (blood work) drawn today and your tests are completely normal, you will receive your results only by: Marland Kitchen MyChart Message (if you have MyChart) OR . A paper copy in the mail If you have any lab test that is abnormal or we need to change your treatment, we will call you to review the results.  Testing/Procedures: NONE  Follow-Up: 07/09/2019 AT 1:20 IN PERSON WITH DR [facility]   Other Instructions:  TRY TO EXERCISE 150 MINUTES EACH WEEK   Vegan Diet A vegan diet excludes all foods that come from animals, including foods that are made from meat, fish, poultry, dairy, and eggs. A vegan diet may be followed for ethical or health reasons. What are tips for following this plan? While following this diet, it is important to find plant sources or supplements that contain the same nutrients you would find in the foods that come from animals. Talk with your health care provider or dietitian about whether you should be taking nutritional supplements. What do I need to know about the vegan diet?  This diet includes all foods that come from plants. Some people choose not to eat sea vegetables, such as seaweed.  This diet excludes all foods that come from animals.  A vegan diet lacks certain nutrients that are commonly found in animal products. These nutrients include: ? Protein. ? Vitamin B12. ? Vitamin D. ? Iron. ? Omega-3 fatty acids. ? Calcium. ? Zinc. What foods can I eat?  Fruits Any. Vegetables Any (except sea vegetables, such as kelp and seaweed, if you choose not to eat them). Grains Any. Meats and other proteins Beans, such  as black beans or kidney beans. Other legumes, such as lentils and split peas. Soy products. Nuts, such as almonds, [place] nuts, and pecans. Seeds, such as sunflower seeds. Tofu. Tempeh. Hummus. Dairy Any dairy product that is made with milk that comes from soy, almonds, rice, hemp, coconut, or any other type of nut. Fats and oils All vegetable-based oils, such as olive, canola, coconut, corn, safflower, peanut, and sesame oils. All vegan butters. Beverages Juice. Carbonated soft drinks. Tea. Sweets and desserts Any dessert that is labeled "vegan." Ice cream that is made with milk from soy, almond, rice, hemp, coconut, or other nuts and does not have other animal ingredients (such as chocolate chips that are made from cow milk). Vitamin B12 Breakfast cereals and prepared products that have added vitamin B12. Nutritional yeast. Vitamin D Orange juice. Fortified mushrooms. Cereals with added vitamin D. Iron Dark leafy greens. Nuts. Beans. Grain products that have added iron, such as cereals. Tofu. Tempeh. Soybeans. Quinoa. Plant-based iron is absorbed better when it is eaten with a food that has vitamin C. Omega-3 fatty acids Walnuts. Foods with added omega-3 fatty acids, such as juices. Flax seeds. Hemp seeds. Canola oil and soybean oil. Tofu. Calcium Dark leafy greens, such as kale, bok choy, Chinese cabbage, collard greens, and mustard greens. Broccoli. Okra. Soy products with added calcium. Calcium-fortified breakfast cereals. Calcium-fortified fruit juices. Figs. Zinc Wheat germ, cereals, and breads that have added zinc. Baked beans. Legumes, such as cashews, chickpeas, kidney beans, and green peas. Almonds and nut  butters. Tofu and other soy products. Seasonings and condiments Nutritional yeast. Salt. Pepper. Fresh herbs. Soy sauce. Vegetable broth. The items listed above may not be a complete list of foods and beverages you can eat. Contact a dietitian for more options. What foods  should I avoid? Fruits No fruits need to be avoided. All fruits are included in this diet. Vegetables Sea vegetables, such as kelp and seaweed (if you choose not to eat them). Grains No grains need to be avoided. All grains are included in this diet. Meats and other proteins Any animal meats. Poultry. Eggs. Fish. Seafood. Dairy Milk, cheese, yogurt, or ice cream that is made from cow milk, goat milk, or sheep milk. Fats and oils Butter. Lard. Beverages Cow milk. Goat milk. Sheep milk. Drinks that are sweetened with honey. Sweets and desserts Any desserts that are made with eggs or animal milk. Honey. Cheesecake. Seasonings and condiments Honey. Any condiments made with eggs or animal milk. The items listed above may not be a complete list of foods and beverages to avoid. Contact a dietitian for more information. Summary  A vegan diet excludes any foods or drinks that come from an animal.  A vegan diet may be followed for ethical or health reasons.  When following this diet, you can become deficient in certain vitamins and minerals. These include vitamin B12, iron, calcium, and zinc. Talk with your health care provider or dietitian to make sure you get these important nutrients in your diet. This information is not intended to replace advice given to you by your health care provider. Make sure you discuss any questions you have with your health care provider. Document Revised: 03/23/2017 Document Reviewed: 03/23/2017 Elsevier Patient Education  2020 [employer].

## 2019-04-09 ENCOUNTER — Other Ambulatory Visit: Payer: Self-pay | Admitting: Internal Medicine

## 2019-04-09 ENCOUNTER — Ambulatory Visit: Payer: Medicare HMO | Admitting: Cardiovascular Disease

## 2019-04-10 DIAGNOSIS — H25013 Cortical age-related cataract, bilateral: Secondary | ICD-10-CM | POA: Diagnosis not present

## 2019-04-10 DIAGNOSIS — H3561 Retinal hemorrhage, right eye: Secondary | ICD-10-CM | POA: Diagnosis not present

## 2019-04-10 DIAGNOSIS — H2511 Age-related nuclear cataract, right eye: Secondary | ICD-10-CM | POA: Diagnosis not present

## 2019-04-10 DIAGNOSIS — H2513 Age-related nuclear cataract, bilateral: Secondary | ICD-10-CM | POA: Diagnosis not present

## 2019-04-10 DIAGNOSIS — H35362 Drusen (degenerative) of macula, left eye: Secondary | ICD-10-CM | POA: Diagnosis not present

## 2019-04-10 DIAGNOSIS — H35033 Hypertensive retinopathy, bilateral: Secondary | ICD-10-CM | POA: Diagnosis not present

## 2019-04-10 DIAGNOSIS — H52213 Irregular astigmatism, bilateral: Secondary | ICD-10-CM | POA: Diagnosis not present

## 2019-04-19 ENCOUNTER — Encounter: Payer: Self-pay | Admitting: Internal Medicine

## 2019-04-21 ENCOUNTER — Ambulatory Visit: Payer: Medicare HMO | Attending: Internal Medicine

## 2019-04-21 DIAGNOSIS — Z23 Encounter for immunization: Secondary | ICD-10-CM | POA: Insufficient documentation

## 2019-04-21 NOTE — Progress Notes (Signed)
   Covid-19 Vaccination Clinic  Name:  Virginia Delgado    MRN: [id number] DOB: [birth year]  04/21/2019  [name] was observed post Covid-19 immunization for 30 minutes based on pre-vaccination screening without incidence. She was provided with Vaccine Information Sheet and instruction to access the V-Safe system.   [name] was instructed to call 911 with any severe reactions post vaccine: Marland Kitchen Difficulty breathing  . Swelling of your face and throat  . A fast heartbeat  . A bad rash all over your body  . Dizziness and weakness    Immunizations Administered    Name Date Dose VIS Date Route   Pfizer COVID-19 Vaccine 04/21/2019  9:02 AM 0.3 mL 02/08/2019 Intramuscular   Manufacturer: [place]   Lot: [id number]   [place]: [id number]

## 2019-04-30 DIAGNOSIS — H5711 Ocular pain, right eye: Secondary | ICD-10-CM | POA: Diagnosis not present

## 2019-04-30 DIAGNOSIS — H25811 Combined forms of age-related cataract, right eye: Secondary | ICD-10-CM | POA: Diagnosis not present

## 2019-04-30 DIAGNOSIS — H2511 Age-related nuclear cataract, right eye: Secondary | ICD-10-CM | POA: Diagnosis not present

## 2019-05-13 DIAGNOSIS — H2512 Age-related nuclear cataract, left eye: Secondary | ICD-10-CM | POA: Diagnosis not present

## 2019-05-13 DIAGNOSIS — H25012 Cortical age-related cataract, left eye: Secondary | ICD-10-CM | POA: Diagnosis not present

## 2019-05-14 ENCOUNTER — Ambulatory Visit: Payer: Medicare HMO | Attending: Internal Medicine

## 2019-05-14 DIAGNOSIS — Z23 Encounter for immunization: Secondary | ICD-10-CM

## 2019-05-14 NOTE — Progress Notes (Signed)
   Covid-19 Vaccination Clinic  Name:  Virginia Delgado    MRN: [id number] DOB: 07/04/1945  05/14/2019  Virginia Delgado was observed post Covid-19 immunization for 30 minutes based on pre-vaccination screening without incident. She was provided with Vaccine Information Sheet and instruction to access the V-Safe system.   Virginia Delgado was instructed to call 911 with any severe reactions post vaccine: Marland Kitchen Difficulty breathing  . Swelling of face and throat  . A fast heartbeat  . A bad rash all over body  . Dizziness and weakness   Immunizations Administered    Name Date Dose VIS Date Route   Pfizer COVID-19 Vaccine 05/14/2019  3:50 PM 0.3 mL 02/08/2019 Intramuscular   Manufacturer: [place]   Lot: [id number]   [place]: [id number]

## 2019-06-04 DIAGNOSIS — H5712 Ocular pain, left eye: Secondary | ICD-10-CM | POA: Diagnosis not present

## 2019-06-04 DIAGNOSIS — H25812 Combined forms of age-related cataract, left eye: Secondary | ICD-10-CM | POA: Diagnosis not present

## 2019-06-04 DIAGNOSIS — H2512 Age-related nuclear cataract, left eye: Secondary | ICD-10-CM | POA: Diagnosis not present

## 2019-06-04 DIAGNOSIS — H25012 Cortical age-related cataract, left eye: Secondary | ICD-10-CM | POA: Diagnosis not present

## 2019-06-05 ENCOUNTER — Other Ambulatory Visit: Payer: Self-pay | Admitting: Cardiovascular Disease

## 2019-06-26 DIAGNOSIS — E7800 Pure hypercholesterolemia, unspecified: Secondary | ICD-10-CM | POA: Diagnosis not present

## 2019-06-26 DIAGNOSIS — I10 Essential (primary) hypertension: Secondary | ICD-10-CM | POA: Diagnosis not present

## 2019-06-26 LAB — COMPREHENSIVE METABOLIC PANEL
ALT: 8 IU/L (ref 0–32)
AST: 17 IU/L (ref 0–40)
Albumin/Globulin Ratio: 1.8 (ref 1.2–2.2)
Albumin: 4.4 g/dL (ref 3.7–4.7)
Alkaline Phosphatase: 71 IU/L (ref 39–117)
BUN/Creatinine Ratio: 9 — ABNORMAL LOW (ref 12–28)
BUN: 9 mg/dL (ref 8–27)
Bilirubin Total: 1.2 mg/dL (ref 0.0–1.2)
CO2: 23 mmol/L (ref 20–29)
Calcium: 10.2 mg/dL (ref 8.7–10.3)
Chloride: 104 mmol/L (ref 96–106)
Creatinine, Ser: 0.97 mg/dL (ref 0.57–1.00)
GFR calc Af Amer: 68 mL/min/{1.73_m2} (ref >59)
GFR calc non Af Amer: 59 mL/min/{1.73_m2} — ABNORMAL LOW (ref >59)
Globulin, Total: 2.5 g/dL (ref 1.5–4.5)
Glucose: 87 mg/dL (ref 65–99)
Potassium: 4.2 mmol/L (ref 3.5–5.2)
Sodium: 142 mmol/L (ref 134–144)
Total Protein: 6.9 g/dL (ref 6.0–8.5)

## 2019-06-26 LAB — LIPID PANEL
Chol/HDL Ratio: 3.2 ratio (ref 0.0–4.4)
Cholesterol, Total: 205 mg/dL — ABNORMAL HIGH (ref 100–199)
HDL: 64 mg/dL (ref >39)
LDL Chol Calc (NIH): 124 mg/dL — ABNORMAL HIGH (ref 0–99)
Triglycerides: 95 mg/dL (ref 0–149)
VLDL Cholesterol Cal: 17 mg/dL (ref 5–40)

## 2019-06-27 ENCOUNTER — Encounter: Payer: Self-pay | Admitting: *Deleted

## 2019-07-03 ENCOUNTER — Other Ambulatory Visit: Payer: Self-pay | Admitting: Internal Medicine

## 2019-07-09 ENCOUNTER — Ambulatory Visit: Payer: Medicare HMO | Admitting: Cardiovascular Disease

## 2019-07-11 ENCOUNTER — Encounter: Payer: Self-pay | Admitting: Cardiovascular Disease

## 2019-07-11 ENCOUNTER — Telehealth (INDEPENDENT_AMBULATORY_CARE_PROVIDER_SITE_OTHER): Payer: Medicare HMO | Admitting: Cardiovascular Disease

## 2019-07-11 VITALS — BP 119/76 | HR 67 | Temp 98.0°F | Ht 60.25 in | Wt 137.0 lb

## 2019-07-11 DIAGNOSIS — R079 Chest pain, unspecified: Secondary | ICD-10-CM | POA: Diagnosis not present

## 2019-07-11 DIAGNOSIS — I10 Essential (primary) hypertension: Secondary | ICD-10-CM

## 2019-07-11 DIAGNOSIS — E785 Hyperlipidemia, unspecified: Secondary | ICD-10-CM

## 2019-07-11 DIAGNOSIS — I2584 Coronary atherosclerosis due to calcified coronary lesion: Secondary | ICD-10-CM

## 2019-07-11 DIAGNOSIS — Z5181 Encounter for therapeutic drug level monitoring: Secondary | ICD-10-CM

## 2019-07-11 DIAGNOSIS — I2510 Atherosclerotic heart disease of native coronary artery without angina pectoris: Secondary | ICD-10-CM

## 2019-07-11 DIAGNOSIS — N182 Chronic kidney disease, stage 2 (mild): Secondary | ICD-10-CM

## 2019-07-11 DIAGNOSIS — E7800 Pure hypercholesterolemia, unspecified: Secondary | ICD-10-CM

## 2019-07-11 MED ORDER — PRAVASTATIN SODIUM 10 MG PO TABS: ORAL_TABLET | ORAL | 1 refills | Status: DC

## 2019-07-11 NOTE — Progress Notes (Signed)
Virtual Visit via Video Note   This visit type was conducted due to national recommendations for restrictions regarding the COVID-19 Pandemic (e.g. social distancing) in an effort to limit this patient's exposure and mitigate transmission in our community.  Due to her co-morbid illnesses, this patient is at least at moderate risk for complications without adequate follow up.  This format is felt to be most appropriate for this patient at this time.  All issues noted in this document were discussed and addressed.  A limited physical exam was performed with this format.  Please refer to the patient's chart for her consent to telehealth for [facility].   Date:  07/11/2019   ID:  Virginia Delgado, DOB Dec 14, 1945, MRN [id number]  Patient Location: Home Provider Location: Home  PCP:  Glendale Chard, MD  Cardiologist:  No primary care provider on file.  Electrophysiologist:  None   Evaluation Performed:  Follow-Up Visit  Chief Complaint: Hyperlipidemia  History of Present Illness:    Virginia Delgado is a 74 y.o. female with asymptomatic coronary calcification, hypertension, hyperlipidemia and Hepatitis C s/p treatment who presents for follow up. She was initially seen 06/2015 for  management of hypertension.   She was previously treated with HCTZ and metoprolol and her BP was well-controlled until 05/2015.  Virginia Delgado was seen in the ED on 4/27.  Her BP was in the 200s/110s.  She was started on amlodipine and received ativan.  Head CT was negative for stroke.  She followed up with her PCP, Dr. Glendale Chard, who started her on Edarbi and amlodipine.  She was seen in clinic 07/06/15, at which time metoprolol was switched to carvedilol.  Renal artery Doppler was negative for stenosis. She also had a exercise Cardiolite that was negative for ischemia.  She achieved 8.5 METS. Carvedilol was stopped due to fatigue and amlodipine was increased.  She saw Dr. Baird Delgado and was switched from amlodipine to [facility]  because she thought her body was immune to amlodipine.  At her last appointment Virginia Delgado was switched to HCTZ/losartan.  She followed up with our pharmacist 09/2017 and her BP was above goal so amlodipine was added back.   At her last appointment Virginia Delgado reported that her blood pressure was well-controlled.  She had a coronary calcium score on 08/2018 that was 93rd percentile for age and gender.  She was hesitant to start statins and prefers to work on diet and exercise.   She lost 10 lb by switching to a plant based diet.  She cut out all her dairy.  She started back exercising.  She has been sedentary during the pandemic.  She started going back to the gym three days weekly and feels great.  She has no chest pain or pressure she denies shortness of breath, lower extremity edema, orthopnea, or PND.  She notes that her blood pressure has also been running lower since she switched to a plant-based diet.  The patient does not have symptoms concerning for COVID-19 infection (fever, chills, cough, or new shortness of breath).    Past Medical History:  Diagnosis Date  . Coronary artery calcification 04/05/2019   Calcium score 93rd percentile 08/2018.  Marland Kitchen Dermatitis   . Hx of hepatitis C   . Hypertension    Past Surgical History:  Procedure Laterality Date  . CESAREAN SECTION    . UMBILICAL HERNIA REPAIR       Current Meds  Medication Sig  . amLODipine (NORVASC) 10 MG tablet TAKE 1  TABLET BY MOUTH EVERY DAY  . aspirin 81 MG tablet Take 81 mg by mouth daily.  . cholecalciferol (VITAMIN D) 1000 units tablet Take 1,000 Units by mouth daily.  Marland Kitchen co-enzyme Q-10 50 MG capsule Take 50 mg by mouth daily.  Marland Kitchen losartan-hydrochlorothiazide (HYZAAR) 50-12.5 MG tablet TAKE 1 TABLET BY MOUTH EVERY DAY  . magnesium 30 MG tablet Take 30 mg by mouth daily.   . NONI, MORINDA CITRIFOLIA, PO Take 1 capsule by mouth daily.  . Plant Sterols and Stanols (CHOLESTOFF PO) Take by mouth daily.     Allergies:   Contrast  media [iodinated diagnostic agents], Gadolinium derivatives, Lidocaine, and Latex   Social History   Tobacco Use  . Smoking status: Never Smoker  . Smokeless tobacco: Never Used  Substance Use Topics  . Alcohol use: Yes    Comment: social  . Drug use: No     Family Hx: The patient's family history includes Delgado in her mother and sister; Heart disease in her father; Hypertension in her brother and sister; Ovarian Delgado in her maternal grandmother.  ROS:   Please see the history of present illness.    All other systems reviewed and are negative.   Prior CV studies:   The following studies were reviewed today:  Renal artery ultrasound 07/09/15: Normal renal arteries  Lexiscan Cardiolite 07/17/15:  Nuclear stress EF: 64%.  The left ventricular ejection fraction is normal (55-65%).  Blood pressure demonstrated a hypertensive response to exercise.  ST segment depression was noted during stress in the II, III, aVF, V5 and V6 leads.  The study is normal  Labs/Other Tests and Data Reviewed:    EKG:  No ECG reviewed.  Recent Labs: 06/26/2019: ALT 8; BUN 9; Creatinine, Ser 0.97; Potassium 4.2; Sodium 142   Recent Lipid Panel Lab Results  Component Value Date/Time   CHOL 205 (H) 06/26/2019 11:21 AM   TRIG 95 06/26/2019 11:21 AM   HDL 64 06/26/2019 11:21 AM   CHOLHDL 3.2 06/26/2019 11:21 AM   LDLCALC 124 (H) 06/26/2019 11:21 AM   Recent Labs: 03/21/2018: ALT 14; BUN 10; Creatinine, Ser 1.02; Potassium 3.8; Sodium 143   05/20/15: Hemoglobin A1c 5.7% Total cholesterol 238, tri 96, HDL 80, LDL 139 AST 19 ALT 14  09/06/17:  Total cholesterol 254, triglycerides 75, HDL 85, LDL 154 Hemoglobin 13.6 Creatinine 1.04 Potassium 3.9  Wt Readings from Last 3 Encounters:  07/11/19 137 lb (62.1 kg)  04/05/19 145 lb (65.8 kg)  03/13/19 149 lb 6.4 oz (67.8 kg)     Objective:    Vital Signs:  BP 119/76   Pulse 67   Temp 98 F (36.7 C)   Ht 5' 0.25" (1.53 m)   Wt 137  lb (62.1 kg)   BMI 26.53 kg/m    VITAL SIGNS:  reviewed GEN:  no acute distress EYES:  sclerae anicteric, EOMI - Extraocular Movements Intact RESPIRATORY:  normal respiratory effort, symmetric expansion CARDIOVASCULAR:  no peripheral edema SKIN:  no rash, lesions or ulcers. MUSCULOSKELETAL:  no obvious deformities. NEURO:  alert and oriented x 3, no obvious focal deficit PSYCH:  normal affect  ASSESSMENT & PLAN:    # Hypertension: Virginia Delgado BP is well-controlled. Continue amlodipine, losartan and HCTZ.   If she continues with her diet and exercise, we may be able to scale back some on her blood pressure medications.  # Chest pain: Stress test was negative for ischemia.  No recurrent chest pain.    #Asymptomatic coronary  calcification: # Hyperlipidemia:   Calcium score was 93rd percentile.  LDL remained above goal despite switching to a plant-based diet and exercising.  She is willing to try a statin again.  She did not tolerate them in the past.  We will start with pravastatin 10 mg 3 times weekly.  Repeat lipids and a CMP in 3 months.  Continue with her current interventions.  If her lipids remain above goal     COVID-19 Education: The signs and symptoms of COVID-19 were discussed with the patient and how to seek care for testing (follow up with PCP or arrange E-visit).  The importance of social distancing was discussed today.  Time:   Today, I have spent 15 minutes with the patient with telehealth technology discussing the above problems.     Medication Adjustments/Labs and Tests Ordered: Current medicines are reviewed at length with the patient today.  Concerns regarding medicines are outlined above.   Tests Ordered: Orders Placed This Encounter  Procedures  . Lipid panel  . Comprehensive metabolic panel    Medication Changes: Meds ordered this encounter  Medications  . pravastatin (PRAVACHOL) 10 MG tablet    Sig: TAKE Monday, Wednesday, AND Friday ONLY    Dispense:   45 tablet    Refill:  1    Follow Up:  Either In Person or Virtual in 3 month(s)  Signed, Skeet Latch, MD  07/11/2019 11:18 AM    [place]

## 2019-07-11 NOTE — Patient Instructions (Addendum)
Medication Instructions:  START PRAVASTATIN 10 MG Monday, Wednesday, AND Friday ONLY *If you need a refill on your cardiac medications before your next appointment, please call your pharmacy*  Lab Work: FASTING LP/CMET IN 3 MONTHS PRIOR TO FOLLOW UP   If you have labs (blood work) drawn today and your tests are completely normal, you will receive your results only by: Marland Kitchen MyChart Message (if you have MyChart) OR . A paper copy in the mail If you have any lab test that is abnormal or we need to change your treatment, we will call you to review the results.  Testing/Procedures: NONE   Follow-Up: At [facility], you and your health needs are our priority.  As part of our continuing mission to provide you with exceptional heart care, we have created designated Provider Care Teams.  These Care Teams include your primary Cardiologist (physician) and Advanced Practice Providers (APPs -  Physician Assistants and Nurse Practitioners) who all work together to provide you with the care you need, when you need it.  We recommend signing up for the patient portal called "MyChart".  Sign up information is provided on this After Visit Summary.  MyChart is used to connect with patients for Virtual Visits (Telemedicine).  Patients are able to view lab/test results, encounter notes, upcoming appointments, etc.  Non-urgent messages can be sent to your provider as well.   To learn more about what you can do with MyChart, go to [email or web address].    Your next appointment:   Your physician recommends that you schedule a follow-up appointment in: DR [facility] 10/15/2019 AT 8:20 AM

## 2019-09-16 ENCOUNTER — Telehealth: Payer: Self-pay

## 2019-09-16 NOTE — Telephone Encounter (Signed)
PT LVM STATING THAT APPT FOR 8/5 NEEDED TO BE [place] ATT TO CONTACT PT TO [facility], NO ANS LVM FOR PT TO CALL [facility] AND THAT I HAVE CANCELLED 8/5 APPT

## 2019-09-18 DIAGNOSIS — M8580 Other specified disorders of bone density and structure, unspecified site: Secondary | ICD-10-CM | POA: Diagnosis not present

## 2019-09-18 DIAGNOSIS — R102 Pelvic and perineal pain: Secondary | ICD-10-CM | POA: Diagnosis not present

## 2019-09-18 DIAGNOSIS — Z124 Encounter for screening for malignant neoplasm of cervix: Secondary | ICD-10-CM | POA: Diagnosis not present

## 2019-09-18 DIAGNOSIS — Z01419 Encounter for gynecological examination (general) (routine) without abnormal findings: Secondary | ICD-10-CM | POA: Diagnosis not present

## 2019-09-18 DIAGNOSIS — M810 Age-related osteoporosis without current pathological fracture: Secondary | ICD-10-CM | POA: Diagnosis not present

## 2019-10-03 ENCOUNTER — Ambulatory Visit: Payer: Medicare HMO

## 2019-10-03 ENCOUNTER — Encounter: Payer: Medicare HMO | Admitting: Internal Medicine

## 2019-10-09 DIAGNOSIS — E7800 Pure hypercholesterolemia, unspecified: Secondary | ICD-10-CM | POA: Diagnosis not present

## 2019-10-09 DIAGNOSIS — Z5181 Encounter for therapeutic drug level monitoring: Secondary | ICD-10-CM | POA: Diagnosis not present

## 2019-10-09 DIAGNOSIS — I10 Essential (primary) hypertension: Secondary | ICD-10-CM | POA: Diagnosis not present

## 2019-10-09 LAB — COMPREHENSIVE METABOLIC PANEL
ALT: 13 IU/L (ref 0–32)
AST: 16 IU/L (ref 0–40)
Albumin/Globulin Ratio: 1.8 (ref 1.2–2.2)
Albumin: 4.2 g/dL (ref 3.7–4.7)
Alkaline Phosphatase: 74 IU/L (ref 48–121)
BUN/Creatinine Ratio: 13 (ref 12–28)
BUN: 13 mg/dL (ref 8–27)
Bilirubin Total: 0.7 mg/dL (ref 0.0–1.2)
CO2: 26 mmol/L (ref 20–29)
Calcium: 10.4 mg/dL — ABNORMAL HIGH (ref 8.7–10.3)
Chloride: 104 mmol/L (ref 96–106)
Creatinine, Ser: 1.02 mg/dL — ABNORMAL HIGH (ref 0.57–1.00)
GFR calc Af Amer: 64 mL/min/{1.73_m2} (ref >59)
GFR calc non Af Amer: 55 mL/min/{1.73_m2} — ABNORMAL LOW (ref >59)
Globulin, Total: 2.3 g/dL (ref 1.5–4.5)
Glucose: 95 mg/dL (ref 65–99)
Potassium: 4.1 mmol/L (ref 3.5–5.2)
Sodium: 141 mmol/L (ref 134–144)
Total Protein: 6.5 g/dL (ref 6.0–8.5)

## 2019-10-09 LAB — LIPID PANEL
Chol/HDL Ratio: 2.8 ratio (ref 0.0–4.4)
Cholesterol, Total: 188 mg/dL (ref 100–199)
HDL: 68 mg/dL (ref >39)
LDL Chol Calc (NIH): 108 mg/dL — ABNORMAL HIGH (ref 0–99)
Triglycerides: 66 mg/dL (ref 0–149)
VLDL Cholesterol Cal: 12 mg/dL (ref 5–40)

## 2019-10-15 ENCOUNTER — Encounter: Payer: Self-pay | Admitting: Cardiovascular Disease

## 2019-10-15 ENCOUNTER — Telehealth (INDEPENDENT_AMBULATORY_CARE_PROVIDER_SITE_OTHER): Payer: Medicare HMO | Admitting: Cardiovascular Disease

## 2019-10-15 DIAGNOSIS — I2510 Atherosclerotic heart disease of native coronary artery without angina pectoris: Secondary | ICD-10-CM | POA: Diagnosis not present

## 2019-10-15 DIAGNOSIS — I2584 Coronary atherosclerosis due to calcified coronary lesion: Secondary | ICD-10-CM | POA: Diagnosis not present

## 2019-10-15 DIAGNOSIS — E785 Hyperlipidemia, unspecified: Secondary | ICD-10-CM

## 2019-10-15 DIAGNOSIS — I10 Essential (primary) hypertension: Secondary | ICD-10-CM | POA: Diagnosis not present

## 2019-10-15 NOTE — Addendum Note (Signed)
Addended by: Alvina Filbert B on: 10/15/2019 09:01 AM   Modules accepted: Orders

## 2019-10-15 NOTE — Patient Instructions (Signed)
Medication Instructions:  Your physician recommends that you continue on your current medications as directed. Please refer to the Current Medication list given to you today.  *If you need a refill on your cardiac medications before your next appointment, please call your pharmacy*  Lab Work: FASTING LIPID IN 3 MONTHS  If you have labs (blood work) drawn today and your tests are completely normal, you will receive your results only by: Marland Kitchen MyChart Message (if you have MyChart) OR . A paper copy in the mail If you have any lab test that is abnormal or we need to change your treatment, we will call you to review the results.   Testing/Procedures: NONE  Follow-Up: At [facility], you and your health needs are our priority.  As part of our continuing mission to provide you with exceptional heart care, we have created designated Provider Care Teams.  These Care Teams include your primary Cardiologist (physician) and Advanced Practice Providers (APPs -  Physician Assistants and Nurse Practitioners) who all work together to provide you with the care you need, when you need it.  We recommend signing up for the patient portal called "MyChart".  Sign up information is provided on this After Visit Summary.  MyChart is used to connect with patients for Virtual Visits (Telemedicine).  Patients are able to view lab/test results, encounter notes, upcoming appointments, etc.  Non-urgent messages can be sent to your provider as well.   To learn more about what you can do with MyChart, go to [email or web address].    Your next appointment:   6 month(s)  The format for your next appointment:   In Person  Provider:   04/17/2020 at 8:00 AM WITH DR [facility]

## 2019-10-15 NOTE — Progress Notes (Signed)
Virtual Visit via Video Note   This visit type was conducted due to national recommendations for restrictions regarding the COVID-19 Pandemic (e.g. social distancing) in an effort to limit this patient's exposure and mitigate transmission in our community.  Due to her co-morbid illnesses, this patient is at least at moderate risk for complications without adequate follow up.  This format is felt to be most appropriate for this patient at this time.  All issues noted in this document were discussed and addressed.  A limited physical exam was performed with this format.  Please refer to the patient's chart for her consent to telehealth for [facility].   Date:  10/15/2019   ID:  Virginia Delgado, DOB 06-Dec-1945, MRN [id number]  Patient Location: Home Provider Location: Home  PCP:  Glendale Chard, MD  Cardiologist:  No primary care provider on file.  Electrophysiologist:  None   Evaluation Performed:  Follow-Up Visit  Chief Complaint: Hyperlipidemia  History of Present Illness:    Virginia Delgado is a 74 y.o. female with asymptomatic coronary calcification, hypertension, hyperlipidemia and Hepatitis C s/p treatment who presents for follow up. She was initially seen 06/2015 for  management of hypertension.   She was previously treated with HCTZ and metoprolol and her BP was well-controlled until 05/2015.  Virginia Delgado was seen in the ED on 4/27.  Her BP was in the 200s/110s.  She was started on amlodipine and received ativan.  Head CT was negative for stroke.  She followed up with her PCP, Dr. Glendale Chard, who started her on Edarbi and amlodipine.  She was seen in clinic 07/06/15, at which time metoprolol was switched to carvedilol.  Renal artery Doppler was negative for stenosis. She also had a exercise Cardiolite that was negative for ischemia.  She achieved 8.5 METS. Carvedilol was stopped due to fatigue and amlodipine was increased.  She saw Dr. Baird Cancer and was switched from amlodipine to [facility]  because she thought her body was immune to amlodipine.  At her last appointment Virginia Delgado was switched to HCTZ/losartan.  She followed up with our pharmacist 09/2017 and her BP was above goal so amlodipine was added back.   Virginia Delgado ad a coronary calcium score on 08/2018 that was 93rd percentile for age and gender.  She was hesitant to start statins and prefers to work on diet and exercise.   She lost 10 lb by switching to a plant based diet.  She cut out all dairy.  She started back exercising.  She improved her cholesterol significantly but still is not at goal so pravastatin was started 3 days/week. She noticed that she was cognitively foggy and unstable.  She stopped taking it.  She continues with her plant based diet and is feeling well.  She continues to exercise but not as regularly.  She has been trying to do more weight training.   The patient does not have symptoms concerning for COVID-19 infection (fever, chills, cough, or new shortness of breath).    Past Medical History:  Diagnosis Date  . Coronary artery calcification 04/05/2019   Calcium score 93rd percentile 08/2018.  Marland Kitchen Dermatitis   . Hx of hepatitis C   . Hypertension    Past Surgical History:  Procedure Laterality Date  . CESAREAN SECTION    . UMBILICAL HERNIA REPAIR       Current Meds  Medication Sig  . amLODipine (NORVASC) 10 MG tablet TAKE 1 TABLET BY MOUTH EVERY DAY  . aspirin 81 MG  tablet Take 81 mg by mouth daily.  . cholecalciferol (VITAMIN D) 1000 units tablet Take 1,000 Units by mouth daily.  Marland Kitchen co-enzyme Q-10 50 MG capsule Take 50 mg by mouth daily.  Marland Kitchen losartan-hydrochlorothiazide (HYZAAR) 50-12.5 MG tablet TAKE 1 TABLET BY MOUTH EVERY DAY  . magnesium 30 MG tablet Take 30 mg by mouth daily.   . NONI, MORINDA CITRIFOLIA, PO Take 1 capsule by mouth daily.  . Plant Sterols and Stanols (CHOLESTOFF PO) Take by mouth daily.  . [DISCONTINUED] pravastatin (PRAVACHOL) 10 MG tablet TAKE Monday, Wednesday, AND Friday ONLY      Allergies:   Contrast media [iodinated diagnostic agents], Gadolinium derivatives, Lidocaine, and Latex   Social History   Tobacco Use  . Smoking status: Never Smoker  . Smokeless tobacco: Never Used  Vaping Use  . Vaping Use: Never used  Substance Use Topics  . Alcohol use: Yes    Comment: social  . Drug use: No     Family Hx: The patient's family history includes Cancer in her mother and sister; Heart disease in her father; Hypertension in her brother and sister; Ovarian cancer in her maternal grandmother.  ROS:   Please see the history of present illness.    All other systems reviewed and are negative.   Prior CV studies:   The following studies were reviewed today:  Renal artery ultrasound 07/09/15: Normal renal arteries  Lexiscan Cardiolite 07/17/15:  Nuclear stress EF: 64%.  The left ventricular ejection fraction is normal (55-65%).  Blood pressure demonstrated a hypertensive response to exercise.  ST segment depression was noted during stress in the II, III, aVF, V5 and V6 leads.  The study is normal  Labs/Other Tests and Data Reviewed:    EKG:  No ECG reviewed.  Recent Labs: 10/09/2019: ALT 13; BUN 13; Creatinine, Ser 1.02; Potassium 4.1; Sodium 141   Recent Lipid Panel Lab Results  Component Value Date/Time   CHOL 188 10/09/2019 09:42 AM   TRIG 66 10/09/2019 09:42 AM   HDL 68 10/09/2019 09:42 AM   CHOLHDL 2.8 10/09/2019 09:42 AM   LDLCALC 108 (H) 10/09/2019 09:42 AM   Recent Labs: 03/21/2018: ALT 14; BUN 10; Creatinine, Ser 1.02; Potassium 3.8; Sodium 143   05/20/15: Hemoglobin A1c 5.7% Total cholesterol 238, tri 96, HDL 80, LDL 139 AST 19 ALT 14  09/06/17:  Total cholesterol 254, triglycerides 75, HDL 85, LDL 154 Hemoglobin 13.6 Creatinine 1.04 Potassium 3.9  Wt Readings from Last 3 Encounters:  07/11/19 137 lb (62.1 kg)  04/05/19 145 lb (65.8 kg)  03/13/19 149 lb 6.4 oz (67.8 kg)     Objective:    VS:  There were no vitals  taken for this visit. , BMI There is no height or weight on file to calculate BMI. GENERAL:  Sounds well NEURO:  Speech fluent PSYCH:  Cognitively intact, oriented to person place and time   ASSESSMENT & PLAN:    # Hypertension: Virginia Delgado BP has been well-controlled at home but was high when she saw her doctor yesterday.  She will start back checking  # Chest pain: Stress test was negative for ischemia.  No recurrent chest pain.     #Asymptomatic coronary calcification: # Hyperlipidemia:    Calcium score was 93rd percentile.  LDL remained above goal despite switching to a plant-based diet and exercising.  She tried pravastatin but did not tolerate it.  Continue with diet and exercise interventions.  We will repeat lipids 3 months.  Her LDL  goal is less than 70.   COVID-19 Education: The signs and symptoms of COVID-19 were discussed with the patient and how to seek care for testing (follow up with PCP or arrange E-visit).  The importance of social distancing was discussed today.  Time:   Today, I have spent 15 minutes with the patient with telehealth technology discussing the above problems.     Medication Adjustments/Labs and Tests Ordered: Current medicines are reviewed at length with the patient today.  Concerns regarding medicines are outlined above.   Tests Ordered: No orders of the defined types were placed in this encounter.   Medication Changes: No orders of the defined types were placed in this encounter.   Follow Up:  Either In Person or Virtual in 3 month(s)  Signed, Skeet Latch, MD  10/15/2019 8:52 AM    [place]

## 2019-10-16 ENCOUNTER — Encounter: Payer: Self-pay | Admitting: *Deleted

## 2019-10-18 DIAGNOSIS — H26493 Other secondary cataract, bilateral: Secondary | ICD-10-CM | POA: Diagnosis not present

## 2019-10-18 DIAGNOSIS — Z961 Presence of intraocular lens: Secondary | ICD-10-CM | POA: Diagnosis not present

## 2019-10-18 DIAGNOSIS — H35362 Drusen (degenerative) of macula, left eye: Secondary | ICD-10-CM | POA: Diagnosis not present

## 2019-10-18 DIAGNOSIS — H35033 Hypertensive retinopathy, bilateral: Secondary | ICD-10-CM | POA: Diagnosis not present

## 2019-10-18 DIAGNOSIS — H43813 Vitreous degeneration, bilateral: Secondary | ICD-10-CM | POA: Diagnosis not present

## 2019-10-21 ENCOUNTER — Other Ambulatory Visit: Payer: Self-pay

## 2019-10-21 ENCOUNTER — Encounter: Payer: Self-pay | Admitting: Internal Medicine

## 2019-10-21 ENCOUNTER — Ambulatory Visit (INDEPENDENT_AMBULATORY_CARE_PROVIDER_SITE_OTHER): Payer: Medicare HMO | Admitting: Internal Medicine

## 2019-10-21 VITALS — BP 134/72 | HR 79 | Temp 98.6°F | Ht 60.25 in | Wt 130.0 lb

## 2019-10-21 DIAGNOSIS — M542 Cervicalgia: Secondary | ICD-10-CM | POA: Diagnosis not present

## 2019-10-21 DIAGNOSIS — M79671 Pain in right foot: Secondary | ICD-10-CM

## 2019-10-21 DIAGNOSIS — H9203 Otalgia, bilateral: Secondary | ICD-10-CM | POA: Diagnosis not present

## 2019-10-21 NOTE — Patient Instructions (Signed)

## 2019-10-21 NOTE — Progress Notes (Signed)
This visit occurred during the SARS-CoV-2 public health emergency.  Safety protocols were in place, including screening questions prior to the visit, additional usage of staff PPE, and extensive cleaning of exam room while observing appropriate contact time as indicated for disinfecting solutions.  Subjective:     Patient ID: Virginia Delgado , female    DOB: 02-02-1946 , 74 y.o.   MRN: [id number]   Chief Complaint  Patient presents with  . Foot Pain    HPI  The patient is here today for right foot pain evaluation.   Foot Pain Associated symptoms include neck pain.  Otalgia  Associated symptoms include neck pain.     Past Medical History:  Diagnosis Date  . Coronary artery calcification 04/05/2019   Calcium score 93rd percentile 08/2018.  Marland Kitchen Dermatitis   . Hx of hepatitis C   . Hypertension      Family History  Problem Relation Age of Onset  . Cancer Mother        breast  . Heart disease Father   . Cancer Sister        sarcoma  . Hypertension Brother   . Ovarian cancer Maternal Grandmother   . Hypertension Sister      Current Outpatient Medications:  .  amLODipine (NORVASC) 10 MG tablet, TAKE 1 TABLET BY MOUTH EVERY DAY, Disp: 90 tablet, Rfl: 0 .  aspirin 81 MG tablet, Take 81 mg by mouth daily., Disp: , Rfl:  .  cholecalciferol (VITAMIN D) 1000 units tablet, Take 1,000 Units by mouth daily., Disp: , Rfl:  .  co-enzyme Q-10 50 MG capsule, Take 50 mg by mouth daily., Disp: , Rfl:  .  losartan-hydrochlorothiazide (HYZAAR) 50-12.5 MG tablet, TAKE 1 TABLET BY MOUTH EVERY DAY, Disp: 90 tablet, Rfl: 2 .  magnesium 30 MG tablet, Take 30 mg by mouth daily. , Disp: , Rfl:  .  NONI, MORINDA CITRIFOLIA, PO, Take 1 capsule by mouth daily., Disp: , Rfl:  .  Plant Sterols and Stanols (CHOLESTOFF PO), Take by mouth daily., Disp: , Rfl:    Allergies  Allergen Reactions  . Contrast Media [Iodinated Diagnostic Agents] Swelling    Of face, especially eyes  . Gadolinium Derivatives Other  (See Comments)    Felt extremities go numb briefly as contrast injected.  Had cough (bronchospasm?) briefly after scan.  Resolved on own without treatment.  07/13/15  . Lidocaine Other (See Comments)    Facial swelling after dental procedure  . Pravachol [Pravastatin Sodium]   . Latex Rash and Other (See Comments)    and irritation     Review of Systems  Constitutional: Negative.   HENT: Positive for ear pain.        States she developed severe ear pain shortly after receiving COVID vaccine. Pain later radiated to her jaw. Not sure what else could have triggered her sx .Denies h/o tinnitus. Denies hearing loss. Her sx have since resolved. However, now she is having b/l neck pain.   Respiratory: Negative.   Cardiovascular: Negative.        Left foot swelling  Gastrointestinal: Negative.   Musculoskeletal: Positive for neck pain.       Denies b/l UE weakness/paresthesias.   Psychiatric/Behavioral: Negative.   All other systems reviewed and are negative.    Today's Vitals   10/21/19 1412  BP: 134/72  Pulse: 79  Temp: 98.6 F (37 C)  TempSrc: Oral  Weight: 130 lb (59 kg)  Height: 5' 0.25" (1.53 m)  PainSc:  6   PainLoc: Foot   Body mass index is 25.18 kg/m.  Wt Readings from Last 3 Encounters:  10/21/19 130 lb (59 kg)  07/11/19 137 lb (62.1 kg)  04/05/19 145 lb (65.8 kg)   Objective:  Physical Exam Vitals and nursing note reviewed.  Constitutional:      Appearance: Normal appearance.  HENT:     Head: Normocephalic and atraumatic.     Right Ear: Tympanic membrane, ear canal and external ear normal. There is no impacted cerumen.     Left Ear: Tympanic membrane, ear canal and external ear normal. There is no impacted cerumen.  Cardiovascular:     Rate and Rhythm: Normal rate and regular rhythm.     Pulses:          Dorsalis pedis pulses are 2+ on the right side and 2+ on the left side.     Heart sounds: Normal heart sounds.  Pulmonary:     Breath sounds: Normal breath  sounds.  Musculoskeletal:     Cervical back: Normal range of motion. Tenderness present.  Feet:     Comments: R foot swelling. No overlying erythema.  Lymphadenopathy:     Cervical: Cervical adenopathy present.  Skin:    General: Skin is warm.  Neurological:     General: No focal deficit present.     Mental Status: She is alert and oriented to person, place, and time.          Assessment And Plan:     1. Right foot pain Comments: She was advised to apply Voltaren gel to affected area bid prn. I will also refer her to Podiatry for further evaluation. She is in agreement w/ tx plan.  - Ambulatory referral to Podiatry  2. Cervicalgia Comments: She agrees to chiropractic evaluation. Also advised to take magnesium nightly. She will let me know if her sx persist/worsen. If needed, will check radiographic - Ambulatory referral to Chiropractic  3. Otalgia of both ears Comments: Resolved.      Patient was given opportunity to ask questions. Patient verbalized understanding of the plan and was able to repeat key elements of the plan. All questions were answered to their satisfaction.  Maximino Greenland, MD   I, Maximino Greenland, MD, have reviewed all documentation for this visit. The documentation on 10/22/19 for the exam, diagnosis, procedures, and orders are all accurate and complete.  THE PATIENT IS ENCOURAGED TO PRACTICE SOCIAL DISTANCING DUE TO THE COVID-19 PANDEMIC.

## 2019-11-05 DIAGNOSIS — R69 Illness, unspecified: Secondary | ICD-10-CM | POA: Diagnosis not present

## 2019-11-08 ENCOUNTER — Telehealth: Payer: Self-pay

## 2019-11-08 DIAGNOSIS — R69 Illness, unspecified: Secondary | ICD-10-CM | POA: Diagnosis not present

## 2019-11-08 NOTE — Telephone Encounter (Signed)
The pt was notified that her chiropractor has recommended x-rays, the pt can pick the order up on Monday along with the directions to [facility] on [facility] and the order has also been faxed.  The pt requested that the order and address be emailed to [email or web address].  The order was emailed to the pt.

## 2019-11-12 ENCOUNTER — Other Ambulatory Visit: Payer: Self-pay

## 2019-11-12 ENCOUNTER — Ambulatory Visit (INDEPENDENT_AMBULATORY_CARE_PROVIDER_SITE_OTHER): Payer: Medicare HMO | Admitting: Nurse Practitioner

## 2019-11-12 ENCOUNTER — Encounter: Payer: Self-pay | Admitting: Nurse Practitioner

## 2019-11-12 VITALS — BP 112/72 | HR 61 | Temp 98.4°F | Ht 60.8 in | Wt 133.2 lb

## 2019-11-12 DIAGNOSIS — I10 Essential (primary) hypertension: Secondary | ICD-10-CM | POA: Diagnosis not present

## 2019-11-12 DIAGNOSIS — E785 Hyperlipidemia, unspecified: Secondary | ICD-10-CM

## 2019-11-12 DIAGNOSIS — Z0000 Encounter for general adult medical examination without abnormal findings: Secondary | ICD-10-CM | POA: Diagnosis not present

## 2019-11-12 DIAGNOSIS — Z789 Other specified health status: Secondary | ICD-10-CM | POA: Diagnosis not present

## 2019-11-12 DIAGNOSIS — Z6825 Body mass index (BMI) 25.0-25.9, adult: Secondary | ICD-10-CM | POA: Diagnosis not present

## 2019-11-12 DIAGNOSIS — M47812 Spondylosis without myelopathy or radiculopathy, cervical region: Secondary | ICD-10-CM | POA: Diagnosis not present

## 2019-11-12 LAB — POCT URINALYSIS DIPSTICK
Glucose, UA: NEGATIVE
Leukocytes, UA: NEGATIVE
Nitrite, UA: NEGATIVE
Protein, UA: NEGATIVE
Spec Grav, UA: 1.025 (ref 1.010–1.025)
Urobilinogen, UA: 0.2 E.U./dL
pH, UA: 5.5 (ref 5.0–8.0)

## 2019-11-12 LAB — POCT UA - MICROALBUMIN
Albumin/Creatinine Ratio, Urine, POC: 300
Creatinine, POC: 200 mg/dL
Microalbumin Ur, POC: 80 mg/L

## 2019-11-12 NOTE — Patient Instructions (Signed)
Health Maintenance, Female Adopting a healthy lifestyle and getting preventive care are important in promoting health and wellness. Ask your health care provider about:  The right schedule for you to have regular tests and exams.  Things you can do on your own to prevent diseases and keep yourself healthy. What should I know about diet, weight, and exercise? Eat a healthy diet   Eat a diet that includes plenty of vegetables, fruits, low-fat dairy products, and lean protein.  Do not eat a lot of foods that are high in solid fats, added sugars, or sodium. Maintain a healthy weight Body mass index (BMI) is used to identify weight problems. It estimates body fat based on height and weight. Your health care provider can help determine your BMI and help you achieve or maintain a healthy weight. Get regular exercise Get regular exercise. This is one of the most important things you can do for your health. Most adults should:  Exercise for at least 150 minutes each week. The exercise should increase your heart rate and make you sweat (moderate-intensity exercise).  Do strengthening exercises at least twice a week. This is in addition to the moderate-intensity exercise.  Spend less time sitting. Even light physical activity can be beneficial. Watch cholesterol and blood lipids Have your blood tested for lipids and cholesterol at 72 years of age, then have this test every 5 years. Have your cholesterol levels checked more often if:  Your lipid or cholesterol levels are high.  You are older than 72 years of age.  You are at high risk for heart disease. What should I know about cancer screening? Depending on your health history and family history, you may need to have cancer screening at various ages. This may include screening for:  Breast cancer.  Cervical cancer.  Colorectal cancer.  Skin cancer.  Lung cancer. What should I know about heart disease, diabetes, and high blood  pressure? Blood pressure and heart disease  High blood pressure causes heart disease and increases the risk of stroke. This is more likely to develop in people who have high blood pressure readings, are of African descent, or are overweight.  Have your blood pressure checked: ? Every 3-5 years if you are 18-39 years of age. ? Every year if you are 40 years old or older. Diabetes Have regular diabetes screenings. This checks your fasting blood sugar level. Have the screening done:  Once every three years after age 40 if you are at a normal weight and have a low risk for diabetes.  More often and at a younger age if you are overweight or have a high risk for diabetes. What should I know about preventing infection? Hepatitis B If you have a higher risk for hepatitis B, you should be screened for this virus. Talk with your health care provider to find out if you are at risk for hepatitis B infection. Hepatitis C Testing is recommended for:  Everyone born from 1945 through 1965.  Anyone with known risk factors for hepatitis C. Sexually transmitted infections (STIs)  Get screened for STIs, including gonorrhea and chlamydia, if: ? You are sexually active and are younger than 72 years of age. ? You are older than 72 years of age and your health care provider tells you that you are at risk for this type of infection. ? Your sexual activity has changed since you were last screened, and you are at increased risk for chlamydia or gonorrhea. Ask your health care provider if   you are at risk.  Ask your health care provider about whether you are at high risk for HIV. Your health care provider may recommend a prescription medicine to help prevent HIV infection. If you choose to take medicine to prevent HIV, you should first get tested for HIV. You should then be tested every 3 months for as long as you are taking the medicine. Pregnancy  If you are about to stop having your period (premenopausal) and  you may become pregnant, seek counseling before you get pregnant.  Take 400 to 800 micrograms (mcg) of folic acid every day if you become pregnant.  Ask for birth control (contraception) if you want to prevent pregnancy. Osteoporosis and menopause Osteoporosis is a disease in which the bones lose minerals and strength with aging. This can result in bone fractures. If you are 65 years old or older, or if you are at risk for osteoporosis and fractures, ask your health care provider if you should:  Be screened for bone loss.  Take a calcium or vitamin D supplement to lower your risk of fractures.  Be given hormone replacement therapy (HRT) to treat symptoms of menopause. Follow these instructions at home: Lifestyle  Do not use any products that contain nicotine or tobacco, such as cigarettes, e-cigarettes, and chewing tobacco. If you need help quitting, ask your health care provider.  Do not use street drugs.  Do not share needles.  Ask your health care provider for help if you need support or information about quitting drugs. Alcohol use  Do not drink alcohol if: ? Your health care provider tells you not to drink. ? You are pregnant, may be pregnant, or are planning to become pregnant.  If you drink alcohol: ? Limit how much you use to 0-1 drink a day. ? Limit intake if you are breastfeeding.  Be aware of how much alcohol is in your drink. In the [place] one drink equals one 12 oz bottle of beer (355 mL), one 5 oz glass of wine (148 mL), or one 1 oz glass of hard liquor (44 mL). General instructions  Schedule regular health, dental, and eye exams.  Stay current with your vaccines.  Tell your health care provider if: ? You often feel depressed. ? You have ever been abused or do not feel safe at home. Summary  Adopting a healthy lifestyle and getting preventive care are important in promoting health and wellness.  Follow your health care provider's instructions about healthy  diet, exercising, and getting tested or screened for diseases.  Follow your health care provider's instructions on monitoring your cholesterol and blood pressure. This information is not intended to replace advice given to you by your health care provider. Make sure you discuss any questions you have with your health care provider. Document Revised: 02/07/2018 Document Reviewed: 02/07/2018 Elsevier Patient Education  2020 [employer]  

## 2019-11-12 NOTE — Progress Notes (Signed)
I,Yamilka Roman [employer] as a Education administrator for [employer], FNP.,have documented all relevant documentation on the behalf of Minette Brine, FNP,as directed by  Minette Brine, FNP while in the presence of Minette Brine, [place]. This visit occurred during the SARS-CoV-2 public health emergency.  Safety protocols were in place, including screening questions prior to the visit, additional usage of staff PPE, and extensive cleaning of exam room while observing appropriate contact time as indicated for disinfecting solutions.  Subjective:     Patient ID: Virginia Delgado , female    DOB: [birth year] , 74 y.o.   MRN: [id number]   Chief Complaint  Patient presents with  . Annual Exam    HPI  She is here today for a full physical examination.   Wt Readings from Last 3 Encounters: 11/12/19 : 133 lb 3.2 oz (60.4 kg) 10/21/19 : 130 lb (59 kg) 07/11/19 : 137 lb (62.1 kg)  She has tried statin drugs at least 3 times which caused her joint pain. She has been trying pravastatin 3 days a week, she has stopped taking due to stumbling.    Hypertension This is a chronic problem. The current episode started more than 1 year ago. The problem has been gradually improving since onset. The problem is controlled. Pertinent negatives include no blurred vision, chest pain, palpitations or shortness of breath. Risk factors for coronary artery disease include dyslipidemia and post-menopausal state. Past treatments include angiotensin blockers and calcium channel blockers. The current treatment provides moderate improvement. Hypertensive end-organ damage includes kidney disease.     Past Medical History:  Diagnosis Date  . Coronary artery calcification 04/05/2019   Calcium score 93rd percentile 08/2018.  Marland Kitchen Dermatitis   . Hx of hepatitis C   . Hypertension      Family History  Problem Relation Age of Onset  . Cancer Mother        breast  . Heart disease Father   . Cancer Sister        sarcoma  . Hypertension Brother    . Ovarian cancer Maternal Grandmother   . Hypertension Sister      Current Outpatient Medications:  .  amLODipine (NORVASC) 10 MG tablet, TAKE 1 TABLET BY MOUTH EVERY DAY, Disp: 90 tablet, Rfl: 0 .  aspirin 81 MG tablet, Take 81 mg by mouth daily., Disp: , Rfl:  .  cholecalciferol (VITAMIN D) 1000 units tablet, Take 1,000 Units by mouth daily., Disp: , Rfl:  .  co-enzyme Q-10 50 MG capsule, Take 50 mg by mouth daily., Disp: , Rfl:  .  losartan-hydrochlorothiazide (HYZAAR) 50-12.5 MG tablet, TAKE 1 TABLET BY MOUTH EVERY DAY, Disp: 90 tablet, Rfl: 2 .  magnesium 30 MG tablet, Take 30 mg by mouth daily. , Disp: , Rfl:  .  NONI, MORINDA CITRIFOLIA, PO, Take 1 capsule by mouth daily., Disp: , Rfl:  .  diclofenac Sodium (VOLTAREN) 1 % GEL, Voltaren 1 % topical gel  APPLY 2 GRAM TO THE AFFECTED AREA(S) BY TOPICAL ROUTE 4 TIMES PER DAY, Disp: , Rfl:  .  methylPREDNISolone (MEDROL DOSEPAK) 4 MG TBPK tablet, Take as directed, Disp: 21 tablet, Rfl: 0   Allergies  Allergen Reactions  . Contrast Media [Iodinated Diagnostic Agents] Swelling    Of face, especially eyes  . Gadobutrol Other (See Comments)    Felt extremities go numb briefly as contrast injected.  Had cough (bronchospasm?) briefly after scan.  Resolved on own without treatment.  07/13/15  . Gadolinium Derivatives Other (See Comments)  Felt extremities go numb briefly as contrast injected.  Had cough (bronchospasm?) briefly after scan.  Resolved on own without treatment.  07/13/15  . Lidocaine Other (See Comments)    Facial swelling after dental procedure  . Pravachol [Pravastatin Sodium]   . Latex Rash and Other (See Comments)    and irritation      The patient states she uses post menopausal status for birth control.  Negative for Dysmenorrhea and Negative for Menorrhagia. Negative for: breast discharge, breast lump(s), breast pain and breast self exam. Associated symptoms include abnormal vaginal bleeding. Pertinent negatives  include abnormal bleeding (hematology), anxiety, decreased libido, depression, difficulty falling sleep, dyspareunia, history of infertility, nocturia, sexual dysfunction, sleep disturbances, urinary incontinence, urinary urgency, vaginal discharge and vaginal itching. Diet: plant based diet for the last 6 months.  The patient states her exercise level is minimal due to having problem with her right foot - she is scheduled to see Dr. Cannon Kettle next week.   The patient's tobacco use is:  Social History   Tobacco Use  Smoking Status Never Smoker  Smokeless Tobacco Never Used   She has been exposed to passive smoke. The patient's alcohol use is:  Social History   Substance and Sexual Activity  Alcohol Use Yes   Comment: social    Review of Systems  Constitutional: Negative.   HENT: Negative.   Eyes: Negative.  Negative for blurred vision.  Respiratory: Negative.  Negative for shortness of breath.   Cardiovascular: Negative.  Negative for chest pain and palpitations.  Gastrointestinal: Negative.   Endocrine: Negative.   Genitourinary: Negative.   Musculoskeletal: Negative.   Skin: Negative.   Allergic/Immunologic: Negative.   Neurological: Negative.   Hematological: Negative.   Psychiatric/Behavioral: Negative.      Today's Vitals   11/12/19 1022  BP: 112/72  Pulse: 61  Temp: 98.4 F (36.9 C)  TempSrc: Oral  Weight: 133 lb 3.2 oz (60.4 kg)  Height: 5' 0.8" (1.544 m)  PainSc: 0-No pain   Body mass index is 25.33 kg/m.   Objective:  Physical Exam Constitutional:      General: She is not in acute distress.    Appearance: Normal appearance. She is well-developed. She is obese.  HENT:     Head: Normocephalic and atraumatic.     Right Ear: Hearing, tympanic membrane, ear canal and external ear normal. There is no impacted cerumen.     Left Ear: Hearing, tympanic membrane, ear canal and external ear normal. There is no impacted cerumen.     Nose:     Comments: Deferred -  masked    Mouth/Throat:     Comments: Deferred - masked Eyes:     General: Lids are normal.     Extraocular Movements: Extraocular movements intact.     Conjunctiva/sclera: Conjunctivae normal.     Pupils: Pupils are equal, round, and reactive to light.     Funduscopic exam:    Right eye: No papilledema.        Left eye: No papilledema.  Neck:     Thyroid: No thyroid mass.     Vascular: No carotid bruit.  Cardiovascular:     Rate and Rhythm: Normal rate and regular rhythm.     Pulses: Normal pulses.     Heart sounds: Normal heart sounds. No murmur heard.   Pulmonary:     Effort: Pulmonary effort is normal. No respiratory distress.     Breath sounds: Normal breath sounds.  Abdominal:  General: Abdomen is flat. Bowel sounds are normal. There is no distension.     Palpations: Abdomen is soft.     Tenderness: There is no abdominal tenderness.  Genitourinary:    Rectum: Guaiac result negative.  Musculoskeletal:        General: No swelling. Normal range of motion.     Cervical back: Full passive range of motion without pain, normal range of motion and neck supple.     Right lower leg: No edema.     Left lower leg: No edema.  Skin:    General: Skin is warm and dry.     Capillary Refill: Capillary refill takes less than 2 seconds.  Neurological:     General: No focal deficit present.     Mental Status: She is alert and oriented to person, place, and time.     Cranial Nerves: No cranial nerve deficit.     Sensory: No sensory deficit.  Psychiatric:        Mood and Affect: Mood normal.        Behavior: Behavior normal.        Thought Content: Thought content normal.        Judgment: Judgment normal.         Assessment And Plan:     1. Encounter for general adult medical examination w/o abnormal findings . Behavior modifications discussed and diet history reviewed.   . Pt will continue to exercise regularly and modify diet with low GI, plant based foods and decrease  intake of processed foods.  . Recommend intake of daily multivitamin, Vitamin D, and calcium.  . Recommend mammogram (up to date) for preventive screenings, as well as recommend immunizations that include influenza, TDAP (up to date)  2. Hyperlipidemia LDL goal <70  Chronic, unable to tolerate statins even with 3 days a week  3. Statin intolerance  4. Essential hypertension . B/P is well controlled.  Marland Kitchen BMP ordered to check renal function.  . The importance of regular exercise and dietary modification was stressed to the patient.  . EKG done with NSR HR 65 - POCT Urinalysis Dipstick (81002) - POCT UA - Microalbumin - EKG 12-Lead - BMP8+eGFR  5. BMI 25.0-25.9,adult  Continue with regular exercise and healthy eating.      Patient was given opportunity to ask questions. Patient verbalized understanding of the plan and was able to repeat key elements of the plan. All questions were answered to their satisfaction.   Minette Brine, FNP   I, Minette Brine, FNP, have reviewed all documentation for this visit. The documentation on 11/26/19 for the exam, diagnosis, procedures, and orders are all accurate and complete.  THE PATIENT IS ENCOURAGED TO PRACTICE SOCIAL DISTANCING DUE TO THE COVID-19 PANDEMIC.

## 2019-11-13 LAB — BMP8+EGFR
BUN/Creatinine Ratio: 15 (ref 12–28)
BUN: 15 mg/dL (ref 8–27)
CO2: 23 mmol/L (ref 20–29)
Calcium: 10.5 mg/dL — ABNORMAL HIGH (ref 8.7–10.3)
Chloride: 107 mmol/L — ABNORMAL HIGH (ref 96–106)
Creatinine, Ser: 1.03 mg/dL — ABNORMAL HIGH (ref 0.57–1.00)
GFR calc Af Amer: 63 mL/min/{1.73_m2} (ref >59)
GFR calc non Af Amer: 55 mL/min/{1.73_m2} — ABNORMAL LOW (ref >59)
Glucose: 109 mg/dL — ABNORMAL HIGH (ref 65–99)
Potassium: 4.1 mmol/L (ref 3.5–5.2)
Sodium: 143 mmol/L (ref 134–144)

## 2019-11-21 ENCOUNTER — Ambulatory Visit (INDEPENDENT_AMBULATORY_CARE_PROVIDER_SITE_OTHER): Payer: Medicare HMO

## 2019-11-21 ENCOUNTER — Other Ambulatory Visit: Payer: Self-pay

## 2019-11-21 ENCOUNTER — Encounter: Payer: Self-pay | Admitting: Internal Medicine

## 2019-11-21 ENCOUNTER — Encounter: Payer: Self-pay | Admitting: Sports Medicine

## 2019-11-21 ENCOUNTER — Other Ambulatory Visit: Payer: Self-pay | Admitting: Sports Medicine

## 2019-11-21 ENCOUNTER — Ambulatory Visit: Payer: Medicare HMO | Admitting: Sports Medicine

## 2019-11-21 VITALS — BP 123/73 | HR 76 | Temp 98.2°F

## 2019-11-21 DIAGNOSIS — M79671 Pain in right foot: Secondary | ICD-10-CM

## 2019-11-21 DIAGNOSIS — M7751 Other enthesopathy of right foot: Secondary | ICD-10-CM | POA: Diagnosis not present

## 2019-11-21 DIAGNOSIS — M778 Other enthesopathies, not elsewhere classified: Secondary | ICD-10-CM

## 2019-11-21 DIAGNOSIS — M779 Enthesopathy, unspecified: Secondary | ICD-10-CM

## 2019-11-21 DIAGNOSIS — M7750 Other enthesopathy of unspecified foot: Secondary | ICD-10-CM

## 2019-11-21 MED ORDER — METHYLPREDNISOLONE 4 MG PO TBPK: ORAL_TABLET | ORAL | 0 refills | Status: DC

## 2019-11-21 NOTE — Progress Notes (Signed)
Subjective: Virginia Delgado is a 74 y.o. female patient who presents to office for evaluation of right foot across the top and medial side that has been going on for roughly 2 to 3 months reports that slowly the pain has gotten worse cannot wear certain shoes that rub the area reports that there is a dull achy pain and has been using her topical pain cream without complete relief.  Patient denies any known trauma or injury.  Review of systems noncontributory  Patient Active Problem List   Diagnosis Date Noted   Coronary artery calcification 04/05/2019   Hypertensive nephropathy 03/12/2018   Chronic renal disease, stage II 03/12/2018   Estrogen deficiency 03/12/2018   Hyperlipidemia LDL goal <70 10/20/2017   Menopause 07/19/2011   Hypertension    Hepatitis C     Current Outpatient Medications on File Prior to Visit  Medication Sig Dispense Refill   amLODipine (NORVASC) 10 MG tablet TAKE 1 TABLET BY MOUTH EVERY DAY 90 tablet 0   aspirin 81 MG tablet Take 81 mg by mouth daily.     cholecalciferol (VITAMIN D) 1000 units tablet Take 1,000 Units by mouth daily.     co-enzyme Q-10 50 MG capsule Take 50 mg by mouth daily.     diclofenac Sodium (VOLTAREN) 1 % GEL Voltaren 1 % topical gel  APPLY 2 GRAM TO THE AFFECTED AREA(S) BY TOPICAL ROUTE 4 TIMES PER DAY     losartan-hydrochlorothiazide (HYZAAR) 50-12.5 MG tablet TAKE 1 TABLET BY MOUTH EVERY DAY 90 tablet 2   magnesium 30 MG tablet Take 30 mg by mouth daily.      NONI, MORINDA CITRIFOLIA, PO Take 1 capsule by mouth daily.     No current facility-administered medications on file prior to visit.    Allergies  Allergen Reactions   Contrast Media [Iodinated Diagnostic Agents] Swelling    Of face, especially eyes   Gadobutrol Other (See Comments)    Felt extremities go numb briefly as contrast injected.  Had cough (bronchospasm?) briefly after scan.  Resolved on own without treatment.  07/13/15   Gadolinium Derivatives  Other (See Comments)    Felt extremities go numb briefly as contrast injected.  Had cough (bronchospasm?) briefly after scan.  Resolved on own without treatment.  07/13/15   Lidocaine Other (See Comments)    Facial swelling after dental procedure   Pravachol [Pravastatin Sodium]    Latex Rash and Other (See Comments)    and irritation    Objective:  General: Alert and oriented x3 in no acute distress  Dermatology: No open lesions bilateral lower extremities, no webspace macerations, no ecchymosis bilateral, all nails x 10 are well manicured.  Vascular: Dorsalis Pedis and Posterior Tibial pedal pulses palpable, Capillary Fill Time 3 seconds,(+) pedal hair growth bilateral, no edema bilateral lower extremities, Temperature gradient within normal limits.  Neurology: Johney Maine sensation intact via light touch bilateral.  Musculoskeletal: Mild tenderness with palpation at medial ankle and posterior tibial tendon course of right foot, there is mild guarding and limited range of motion due to pain right foot and ankle.  Strength within normal limits in all groups bilateral.   Gait: Antalgic gait  Xrays  Right foot   Impression: No acute fracture dislocation  Assessment and Plan: Problem List Items Addressed This Visit    None    Visit Diagnoses    Tendonitis    -  Primary   Relevant Orders   DG Foot Complete Right   Capsulitis  Right foot pain           -Complete examination performed -Xrays reviewed -Discussed treatement options for likely tendinitis -Rx prednisone Dosepak for patient to take as instructed -Advised good supportive shoes daily for foot type -Temporary handicap provided for patient so that way she does not have to walk far until her pain resolves -Patient to return to office in 3 weeks or sooner if condition worsens.  Advised patient if pain fails to continue to improve may benefit from brace versus cam boot versus injection  Landis Martins, DPM

## 2019-11-25 DIAGNOSIS — S138XXA Sprain of joints and ligaments of other parts of neck, initial encounter: Secondary | ICD-10-CM | POA: Diagnosis not present

## 2019-11-25 DIAGNOSIS — S29012A Strain of muscle and tendon of back wall of thorax, initial encounter: Secondary | ICD-10-CM | POA: Diagnosis not present

## 2019-11-25 DIAGNOSIS — M9901 Segmental and somatic dysfunction of cervical region: Secondary | ICD-10-CM | POA: Diagnosis not present

## 2019-11-25 DIAGNOSIS — M9902 Segmental and somatic dysfunction of thoracic region: Secondary | ICD-10-CM | POA: Diagnosis not present

## 2019-11-26 ENCOUNTER — Encounter: Payer: Self-pay | Admitting: Nurse Practitioner

## 2019-11-26 DIAGNOSIS — Z789 Other specified health status: Secondary | ICD-10-CM | POA: Insufficient documentation

## 2019-11-28 DIAGNOSIS — M9902 Segmental and somatic dysfunction of thoracic region: Secondary | ICD-10-CM | POA: Diagnosis not present

## 2019-11-28 DIAGNOSIS — M9901 Segmental and somatic dysfunction of cervical region: Secondary | ICD-10-CM | POA: Diagnosis not present

## 2019-11-28 DIAGNOSIS — S138XXA Sprain of joints and ligaments of other parts of neck, initial encounter: Secondary | ICD-10-CM | POA: Diagnosis not present

## 2019-11-28 DIAGNOSIS — S29012A Strain of muscle and tendon of back wall of thorax, initial encounter: Secondary | ICD-10-CM | POA: Diagnosis not present

## 2019-12-02 DIAGNOSIS — H3561 Retinal hemorrhage, right eye: Secondary | ICD-10-CM | POA: Diagnosis not present

## 2019-12-02 DIAGNOSIS — Z961 Presence of intraocular lens: Secondary | ICD-10-CM | POA: Diagnosis not present

## 2019-12-02 DIAGNOSIS — H26491 Other secondary cataract, right eye: Secondary | ICD-10-CM | POA: Diagnosis not present

## 2019-12-02 DIAGNOSIS — H26493 Other secondary cataract, bilateral: Secondary | ICD-10-CM | POA: Diagnosis not present

## 2019-12-02 DIAGNOSIS — H35033 Hypertensive retinopathy, bilateral: Secondary | ICD-10-CM | POA: Diagnosis not present

## 2019-12-03 DIAGNOSIS — M8589 Other specified disorders of bone density and structure, multiple sites: Secondary | ICD-10-CM | POA: Diagnosis not present

## 2019-12-03 LAB — HM DEXA SCAN

## 2019-12-04 DIAGNOSIS — S29012A Strain of muscle and tendon of back wall of thorax, initial encounter: Secondary | ICD-10-CM | POA: Diagnosis not present

## 2019-12-04 DIAGNOSIS — S138XXA Sprain of joints and ligaments of other parts of neck, initial encounter: Secondary | ICD-10-CM | POA: Diagnosis not present

## 2019-12-04 DIAGNOSIS — M9902 Segmental and somatic dysfunction of thoracic region: Secondary | ICD-10-CM | POA: Diagnosis not present

## 2019-12-04 DIAGNOSIS — M9901 Segmental and somatic dysfunction of cervical region: Secondary | ICD-10-CM | POA: Diagnosis not present

## 2019-12-10 DIAGNOSIS — S138XXA Sprain of joints and ligaments of other parts of neck, initial encounter: Secondary | ICD-10-CM | POA: Diagnosis not present

## 2019-12-10 DIAGNOSIS — M9901 Segmental and somatic dysfunction of cervical region: Secondary | ICD-10-CM | POA: Diagnosis not present

## 2019-12-10 DIAGNOSIS — S29012A Strain of muscle and tendon of back wall of thorax, initial encounter: Secondary | ICD-10-CM | POA: Diagnosis not present

## 2019-12-10 DIAGNOSIS — M9902 Segmental and somatic dysfunction of thoracic region: Secondary | ICD-10-CM | POA: Diagnosis not present

## 2019-12-12 ENCOUNTER — Encounter: Payer: Self-pay | Admitting: Internal Medicine

## 2019-12-13 DIAGNOSIS — S138XXA Sprain of joints and ligaments of other parts of neck, initial encounter: Secondary | ICD-10-CM | POA: Diagnosis not present

## 2019-12-13 DIAGNOSIS — M9901 Segmental and somatic dysfunction of cervical region: Secondary | ICD-10-CM | POA: Diagnosis not present

## 2019-12-13 DIAGNOSIS — M9902 Segmental and somatic dysfunction of thoracic region: Secondary | ICD-10-CM | POA: Diagnosis not present

## 2019-12-13 DIAGNOSIS — S29012A Strain of muscle and tendon of back wall of thorax, initial encounter: Secondary | ICD-10-CM | POA: Diagnosis not present

## 2019-12-20 DIAGNOSIS — M9902 Segmental and somatic dysfunction of thoracic region: Secondary | ICD-10-CM | POA: Diagnosis not present

## 2019-12-20 DIAGNOSIS — M9901 Segmental and somatic dysfunction of cervical region: Secondary | ICD-10-CM | POA: Diagnosis not present

## 2019-12-20 DIAGNOSIS — S29012A Strain of muscle and tendon of back wall of thorax, initial encounter: Secondary | ICD-10-CM | POA: Diagnosis not present

## 2019-12-20 DIAGNOSIS — S138XXA Sprain of joints and ligaments of other parts of neck, initial encounter: Secondary | ICD-10-CM | POA: Diagnosis not present

## 2019-12-26 ENCOUNTER — Ambulatory Visit: Payer: Medicare HMO | Admitting: Sports Medicine

## 2020-01-02 ENCOUNTER — Ambulatory Visit: Payer: Medicare HMO | Admitting: Sports Medicine

## 2020-01-02 ENCOUNTER — Other Ambulatory Visit: Payer: Self-pay

## 2020-01-02 ENCOUNTER — Encounter: Payer: Self-pay | Admitting: Sports Medicine

## 2020-01-02 DIAGNOSIS — M2550 Pain in unspecified joint: Secondary | ICD-10-CM | POA: Diagnosis not present

## 2020-01-02 DIAGNOSIS — M79672 Pain in left foot: Secondary | ICD-10-CM

## 2020-01-02 DIAGNOSIS — M779 Enthesopathy, unspecified: Secondary | ICD-10-CM | POA: Diagnosis not present

## 2020-01-02 DIAGNOSIS — M7750 Other enthesopathy of unspecified foot: Secondary | ICD-10-CM

## 2020-01-02 DIAGNOSIS — M79671 Pain in right foot: Secondary | ICD-10-CM | POA: Diagnosis not present

## 2020-01-02 NOTE — Progress Notes (Signed)
Subjective: Virginia Delgado is a 74 y.o. female patient who presents to office for evaluation of right and left foot across the top and medial side that side like before but is concerned because same pains on both feet now with some swelling and discoloration to both feet. States that prednisone did not help. No other issues noted.  Patient Active Problem List   Diagnosis Date Noted  . Statin intolerance 11/26/2019  . Coronary artery calcification 04/05/2019  . Hypertensive nephropathy 03/12/2018  . Chronic renal disease, stage II 03/12/2018  . Estrogen deficiency 03/12/2018  . Hyperlipidemia LDL goal <70 10/20/2017  . Menopause 07/19/2011  . Hypertension   . Hepatitis C     Current Outpatient Medications on File Prior to Visit  Medication Sig Dispense Refill  . amLODipine (NORVASC) 10 MG tablet TAKE 1 TABLET BY MOUTH EVERY DAY 90 tablet 0  . aspirin 81 MG tablet Take 81 mg by mouth daily.    . cholecalciferol (VITAMIN D) 1000 units tablet Take 1,000 Units by mouth daily.    Marland Kitchen co-enzyme Q-10 50 MG capsule Take 50 mg by mouth daily.    . diclofenac Sodium (VOLTAREN) 1 % GEL Voltaren 1 % topical gel  APPLY 2 GRAM TO THE AFFECTED AREA(S) BY TOPICAL ROUTE 4 TIMES PER DAY    . losartan-hydrochlorothiazide (HYZAAR) 50-12.5 MG tablet TAKE 1 TABLET BY MOUTH EVERY DAY 90 tablet 2  . magnesium 30 MG tablet Take 30 mg by mouth daily.     . methylPREDNISolone (MEDROL DOSEPAK) 4 MG TBPK tablet Take as directed 21 tablet 0  . NONI, MORINDA CITRIFOLIA, PO Take 1 capsule by mouth daily.     No current facility-administered medications on file prior to visit.    Allergies  Allergen Reactions  . Contrast Media [Iodinated Diagnostic Agents] Swelling    Of face, especially eyes  . Gadobutrol Other (See Comments)    Felt extremities go numb briefly as contrast injected.  Had cough (bronchospasm?) briefly after scan.  Resolved on own without treatment.  07/13/15  . Gadolinium Derivatives Other (See  Comments)    Felt extremities go numb briefly as contrast injected.  Had cough (bronchospasm?) briefly after scan.  Resolved on own without treatment.  07/13/15  . Lidocaine Other (See Comments)    Facial swelling after dental procedure  . Pravachol [Pravastatin Sodium]   . Latex Rash and Other (See Comments)    and irritation    Objective:  General: Alert and oriented x3 in no acute distress  Dermatology: No open lesions bilateral lower extremities, no webspace macerations, no ecchymosis bilateral, all nails x 10 are well manicured.  Vascular: Dorsalis Pedis and Posterior Tibial pedal pulses palpable, Capillary Fill Time 3 seconds,(+) pedal hair growth bilateral, no edema bilateral lower extremities, Temperature gradient within normal limits.  Neurology: Johney Maine sensation intact via light touch bilateral.  Musculoskeletal: Mild tenderness with palpation at medial ankle and posterior tibial tendon course of Left>Right foot, there is mild guarding and limited range of motion due to pain. Strength within normal limits in all groups bilateral.    Assessment and Plan: Problem List Items Addressed This Visit    None    Visit Diagnoses    Tendonitis of ankle or foot    -  Primary   Relevant Orders   C-reactive protein   Sedimentation rate   Rheumatoid factor   CBC with Differential/Platelet   Uric acid   HLA-B27 antigen   ANA   Arthralgia, unspecified joint  Relevant Orders   C-reactive protein   Sedimentation rate   Rheumatoid factor   CBC with Differential/Platelet   Uric acid   HLA-B27 antigen   ANA   Capsulitis       Right foot pain       Left foot pain           -Complete examination performed -Xrays reviewed -Discussed treatement options for likely tendinitis vs mechanical pain -Rx arthritic panel for further eval for possoble underlying causes of pain since no improvement with PO steroid  -Dispensed fascial braces/ankle gauntlets bilateral -May continue with  topical diclofenac and OTC anti-inflammatories or pain meds -Advised good supportive shoes daily for foot type -Patient to return to office after blood work  Landis Martins, DPM

## 2020-01-03 ENCOUNTER — Encounter: Payer: Self-pay | Admitting: Sports Medicine

## 2020-01-03 DIAGNOSIS — M7750 Other enthesopathy of unspecified foot: Secondary | ICD-10-CM | POA: Diagnosis not present

## 2020-01-03 DIAGNOSIS — M2550 Pain in unspecified joint: Secondary | ICD-10-CM | POA: Diagnosis not present

## 2020-01-06 LAB — ANA: Anti Nuclear Antibody (ANA): POSITIVE — AB

## 2020-01-06 LAB — C-REACTIVE PROTEIN: CRP: 0.2 mg/L (ref <8.0)

## 2020-01-06 LAB — CBC WITH DIFFERENTIAL/PLATELET
Absolute Monocytes: 548 cells/uL (ref 200–950)
Basophils Absolute: 19 cells/uL (ref 0–200)
Basophils Relative: 0.3 %
Eosinophils Absolute: 32 cells/uL (ref 15–500)
Eosinophils Relative: 0.5 %
HCT: 39.4 % (ref 35.0–45.0)
Hemoglobin: 13.2 g/dL (ref 11.7–15.5)
Lymphs Abs: 1525 cells/uL (ref 850–3900)
MCH: 29.9 pg (ref 27.0–33.0)
MCHC: 33.5 g/dL (ref 32.0–36.0)
MCV: 89.3 fL (ref 80.0–100.0)
MPV: 10.6 fL (ref 7.5–12.5)
Monocytes Relative: 8.7 %
Neutro Abs: 4177 cells/uL (ref 1500–7800)
Neutrophils Relative %: 66.3 %
Platelets: 257 10*3/uL (ref 140–400)
RBC: 4.41 10*6/uL (ref 3.80–5.10)
RDW: 12.1 % (ref 11.0–15.0)
Total Lymphocyte: 24.2 %
WBC: 6.3 10*3/uL (ref 3.8–10.8)

## 2020-01-06 LAB — SEDIMENTATION RATE: Sed Rate: 9 mm/h (ref 0–30)

## 2020-01-06 LAB — ANTI-NUCLEAR AB-TITER (ANA TITER)
ANA TITER: 1:40 {titer} — ABNORMAL HIGH
ANA Titer 1: 1:40 {titer} — ABNORMAL HIGH

## 2020-01-06 LAB — URIC ACID: Uric Acid, Serum: 4.9 mg/dL (ref 2.5–7.0)

## 2020-01-06 LAB — HLA-B27 ANTIGEN: HLA-B27 Antigen: NEGATIVE

## 2020-01-06 LAB — RHEUMATOID FACTOR: Rheumatoid fact SerPl-aCnc: 27 IU/mL — ABNORMAL HIGH (ref <14)

## 2020-01-10 ENCOUNTER — Encounter: Payer: Self-pay | Admitting: Sports Medicine

## 2020-01-10 ENCOUNTER — Other Ambulatory Visit: Payer: Self-pay | Admitting: Sports Medicine

## 2020-01-10 DIAGNOSIS — M9901 Segmental and somatic dysfunction of cervical region: Secondary | ICD-10-CM | POA: Diagnosis not present

## 2020-01-10 DIAGNOSIS — M9902 Segmental and somatic dysfunction of thoracic region: Secondary | ICD-10-CM | POA: Diagnosis not present

## 2020-01-10 DIAGNOSIS — S29012A Strain of muscle and tendon of back wall of thorax, initial encounter: Secondary | ICD-10-CM | POA: Diagnosis not present

## 2020-01-10 DIAGNOSIS — M2550 Pain in unspecified joint: Secondary | ICD-10-CM

## 2020-01-10 DIAGNOSIS — S138XXA Sprain of joints and ligaments of other parts of neck, initial encounter: Secondary | ICD-10-CM | POA: Diagnosis not present

## 2020-01-10 NOTE — Progress Notes (Signed)
Referral placed for [facility]

## 2020-01-21 DIAGNOSIS — I10 Essential (primary) hypertension: Secondary | ICD-10-CM | POA: Diagnosis not present

## 2020-01-21 DIAGNOSIS — E785 Hyperlipidemia, unspecified: Secondary | ICD-10-CM | POA: Diagnosis not present

## 2020-01-21 LAB — LIPID PANEL
Chol/HDL Ratio: 3 ratio (ref 0.0–4.4)
Cholesterol, Total: 234 mg/dL — ABNORMAL HIGH (ref 100–199)
HDL: 78 mg/dL (ref >39)
LDL Chol Calc (NIH): 141 mg/dL — ABNORMAL HIGH (ref 0–99)
Triglycerides: 89 mg/dL (ref 0–149)
VLDL Cholesterol Cal: 15 mg/dL (ref 5–40)

## 2020-01-22 ENCOUNTER — Ambulatory Visit (INDEPENDENT_AMBULATORY_CARE_PROVIDER_SITE_OTHER): Payer: Medicare HMO

## 2020-01-22 ENCOUNTER — Other Ambulatory Visit: Payer: Self-pay

## 2020-01-22 VITALS — BP 124/60 | HR 68 | Temp 98.2°F | Ht 60.6 in | Wt 130.2 lb

## 2020-01-22 DIAGNOSIS — Z0000 Encounter for general adult medical examination without abnormal findings: Secondary | ICD-10-CM

## 2020-01-22 NOTE — Patient Instructions (Signed)
Virginia Delgado , Thank you for taking time to come for your Medicare Wellness Visit. I appreciate your ongoing commitment to your health goals. Please review the following plan we discussed and let me know if I can assist you in the future.   Screening recommendations/referrals: Colonoscopy: completed 11/02/2017, due 11/03/2027 Mammogram: completed 02/14/2019, due 02/13/2021 Bone Density: completed 2021, requesting report Recommended yearly ophthalmology/optometry visit for glaucoma screening and checkup Recommended yearly dental visit for hygiene and checkup  Vaccinations: Influenza vaccine: decline Pneumococcal vaccine: completed 09/29/2018 Tdap vaccine: completed 09/29/2018, due 09/28/2028 Shingles vaccine: completed   Covid-19: 05/14/2019, 04/21/2019  Advanced directives: Please bring a copy of your POA (Power of Attorney) and/or Living Will to your next appointment.   Conditions/risks identified: none  Next appointment: 05/12/2020 at 10:00 Follow up in one year for your annual wellness visit    Preventive Care 65 Years and Older, Female Preventive care refers to lifestyle choices and visits with your health care provider that can promote health and wellness. What does preventive care include?  A yearly physical exam. This is also called an annual well check.  Dental exams once or twice a year.  Routine eye exams. Ask your health care provider how often you should have your eyes checked.  Personal lifestyle choices, including:  Daily care of your teeth and gums.  Regular physical activity.  Eating a healthy diet.  Avoiding tobacco and drug use.  Limiting alcohol use.  Practicing safe sex.  Taking low-dose aspirin every day.  Taking vitamin and mineral supplements as recommended by your health care provider. What happens during an annual well check? The services and screenings done by your health care provider during your annual well check will depend on your age, overall health,  lifestyle risk factors, and family history of disease. Counseling  Your health care provider may ask you questions about your:  Alcohol use.  Tobacco use.  Drug use.  Emotional well-being.  Home and relationship well-being.  Sexual activity.  Eating habits.  History of falls.  Memory and ability to understand (cognition).  Work and work Statistician.  Reproductive health. Screening  You may have the following tests or measurements:  Height, weight, and BMI.  Blood pressure.  Lipid and cholesterol levels. These may be checked every 5 years, or more frequently if you are over 50 years old.  Skin check.  Lung cancer screening. You may have this screening every year starting at age 28 if you have a 30-pack-year history of smoking and currently smoke or have quit within the past 15 years.  Fecal occult blood test (FOBT) of the stool. You may have this test every year starting at age 91.  Flexible sigmoidoscopy or colonoscopy. You may have a sigmoidoscopy every 5 years or a colonoscopy every 10 years starting at age 65.  Hepatitis C blood test.  Hepatitis B blood test.  Sexually transmitted disease (STD) testing.  Diabetes screening. This is done by checking your blood sugar (glucose) after you have not eaten for a while (fasting). You may have this done every 1-3 years.  Bone density scan. This is done to screen for osteoporosis. You may have this done starting at age 36.  Mammogram. This may be done every 1-2 years. Talk to your health care provider about how often you should have regular mammograms. Talk with your health care provider about your test results, treatment options, and if necessary, the need for more tests. Vaccines  Your health care provider may recommend certain  vaccines, such as:  Influenza vaccine. This is recommended every year.  Tetanus, diphtheria, and acellular pertussis (Tdap, Td) vaccine. You may need a Td booster every 10 years.  Zoster  vaccine. You may need this after age 6.  Pneumococcal 13-valent conjugate (PCV13) vaccine. One dose is recommended after age 52.  Pneumococcal polysaccharide (PPSV23) vaccine. One dose is recommended after age 89. Talk to your health care provider about which screenings and vaccines you need and how often you need them. This information is not intended to replace advice given to you by your health care provider. Make sure you discuss any questions you have with your health care provider. Document Released: 03/13/2015 Document Revised: 11/04/2015 Document Reviewed: 12/16/2014 Elsevier Interactive Patient Education  2017 [place] Prevention in the Home Falls can cause injuries. They can happen to people of all ages. There are many things you can do to make your home safe and to help prevent falls. What can I do on the outside of my home?  Regularly fix the edges of walkways and driveways and fix any cracks.  Remove anything that might make you trip as you walk through a door, such as a raised step or threshold.  Trim any bushes or trees on the path to your home.  Use bright outdoor lighting.  Clear any walking paths of anything that might make someone trip, such as rocks or tools.  Regularly check to see if handrails are loose or broken. Make sure that both sides of any steps have handrails.  Any raised decks and porches should have guardrails on the edges.  Have any leaves, snow, or ice cleared regularly.  Use sand or salt on walking paths during winter.  Clean up any spills in your garage right away. This includes oil or grease spills. What can I do in the bathroom?  Use night lights.  Install grab bars by the toilet and in the tub and shower. Do not use towel bars as grab bars.  Use non-skid mats or decals in the tub or shower.  If you need to sit down in the shower, use a plastic, non-slip stool.  Keep the floor dry. Clean up any water that spills on the  floor as soon as it happens.  Remove soap buildup in the tub or shower regularly.  Attach bath mats securely with double-sided non-slip rug tape.  Do not have throw rugs and other things on the floor that can make you trip. What can I do in the bedroom?  Use night lights.  Make sure that you have a light by your bed that is easy to reach.  Do not use any sheets or blankets that are too big for your bed. They should not hang down onto the floor.  Have a firm chair that has side arms. You can use this for support while you get dressed.  Do not have throw rugs and other things on the floor that can make you trip. What can I do in the kitchen?  Clean up any spills right away.  Avoid walking on wet floors.  Keep items that you use a lot in easy-to-reach places.  If you need to reach something above you, use a strong step stool that has a grab bar.  Keep electrical cords out of the way.  Do not use floor polish or wax that makes floors slippery. If you must use wax, use non-skid floor wax.  Do not have throw rugs and other things  on the floor that can make you trip. What can I do with my stairs?  Do not leave any items on the stairs.  Make sure that there are handrails on both sides of the stairs and use them. Fix handrails that are broken or loose. Make sure that handrails are as long as the stairways.  Check any carpeting to make sure that it is firmly attached to the stairs. Fix any carpet that is loose or worn.  Avoid having throw rugs at the top or bottom of the stairs. If you do have throw rugs, attach them to the floor with carpet tape.  Make sure that you have a light switch at the top of the stairs and the bottom of the stairs. If you do not have them, ask someone to add them for you. What else can I do to help prevent falls?  Wear shoes that:  Do not have high heels.  Have rubber bottoms.  Are comfortable and fit you well.  Are closed at the toe. Do not wear  sandals.  If you use a stepladder:  Make sure that it is fully opened. Do not climb a closed stepladder.  Make sure that both sides of the stepladder are locked into place.  Ask someone to hold it for you, if possible.  Clearly mark and make sure that you can see:  Any grab bars or handrails.  First and last steps.  Where the edge of each step is.  Use tools that help you move around (mobility aids) if they are needed. These include:  Canes.  Walkers.  Scooters.  Crutches.  Turn on the lights when you go into a dark area. Replace any light bulbs as soon as they burn out.  Set up your furniture so you have a clear path. Avoid moving your furniture around.  If any of your floors are uneven, fix them.  If there are any pets around you, be aware of where they are.  Review your medicines with your doctor. Some medicines can make you feel dizzy. This can increase your chance of falling. Ask your doctor what other things that you can do to help prevent falls. This information is not intended to replace advice given to you by your health care provider. Make sure you discuss any questions you have with your health care provider. Document Released: 12/11/2008 Document Revised: 07/23/2015 Document Reviewed: 03/21/2014 Elsevier Interactive Patient Education  2017 [employer].

## 2020-01-22 NOTE — Progress Notes (Signed)
This visit occurred during the SARS-CoV-2 public health emergency.  Safety protocols were in place, including screening questions prior to the visit, additional usage of staff PPE, and extensive cleaning of exam room while observing appropriate contact time as indicated for disinfecting solutions.  Subjective:   Cassadie Capri is a 74 y.o. female who presents for Medicare Annual (Subsequent) preventive examination.  Review of Systems     Cardiac Risk Factors include: advanced age (>19men, >24 women);dyslipidemia;hypertension;sedentary lifestyle     Objective:    Today's Vitals   01/22/20 1518 01/22/20 1524  BP: 124/60   Pulse: 68   Temp: 98.2 F (36.8 C)   TempSrc: Oral   SpO2: 97%   Weight: 130 lb 3.2 oz (59.1 kg)   Height: 5' 0.6" (1.539 m)   PainSc:  2    Body mass index is 24.93 kg/m.  Advanced Directives 01/22/2020 09/26/2018 05/10/2018 06/24/2015  Does Patient Have a Medical Advance Directive? Yes Yes No No  Type of Advance Directive Living will;Healthcare Power of [place];Living will - -  Copy of [place] in Chart? No - copy requested No - copy requested - -  Would patient like information on creating a medical advance directive? - - No - Patient declined No - patient declined information    Current Medications (verified) Outpatient Encounter Medications as of 01/22/2020  Medication Sig  . amLODipine (NORVASC) 10 MG tablet TAKE 1 TABLET BY MOUTH EVERY DAY  . aspirin 81 MG tablet Take 81 mg by mouth daily.  . cholecalciferol (VITAMIN D) 1000 units tablet Take 1,000 Units by mouth daily.  Marland Kitchen co-enzyme Q-10 50 MG capsule Take 50 mg by mouth daily.  . diclofenac Sodium (VOLTAREN) 1 % GEL Voltaren 1 % topical gel  APPLY 2 GRAM TO THE AFFECTED AREA(S) BY TOPICAL ROUTE 4 TIMES PER DAY  . losartan-hydrochlorothiazide (HYZAAR) 50-12.5 MG tablet TAKE 1 TABLET BY MOUTH EVERY DAY  . magnesium 30 MG tablet Take 30 mg by mouth daily.     . NONI, MORINDA CITRIFOLIA, PO Take 1 capsule by mouth daily.  . methylPREDNISolone (MEDROL DOSEPAK) 4 MG TBPK tablet Take as directed (Patient not taking: Reported on 01/22/2020)   No facility-administered encounter medications on file as of 01/22/2020.    Allergies (verified) Contrast media [iodinated diagnostic agents], Gadobutrol, Gadolinium derivatives, Lidocaine, Pravachol [pravastatin sodium], and Latex   History: Past Medical History:  Diagnosis Date  . Coronary artery calcification 04/05/2019   Calcium score 93rd percentile 08/2018.  Marland Kitchen Dermatitis   . Hx of hepatitis C   . Hypertension    Past Surgical History:  Procedure Laterality Date  . CESAREAN SECTION    . UMBILICAL HERNIA REPAIR     Family History  Problem Relation Age of Onset  . Cancer Mother        breast  . Heart disease Father   . Cancer Sister        sarcoma  . Hypertension Brother   . Ovarian cancer Maternal Grandmother   . Hypertension Sister    Social History   Socioeconomic History  . Marital status: Divorced    Spouse name: Not on file  . Number of children: Not on file  . Years of education: Not on file  . Highest education level: Not on file  Occupational History  . Not on file  Tobacco Use  . Smoking status: Never Smoker  . Smokeless tobacco: Never Used  Vaping Use  . Vaping  Use: Never used  Substance and Sexual Activity  . Alcohol use: Yes    Comment: social  . Drug use: No  . Sexual activity: Yes    Birth control/protection: None  Other Topics Concern  . Not on file  Social History Narrative  . Not on file   Social Determinants of Health   Financial Resource Strain: Medium Risk  . Difficulty of Paying Living Expenses: Somewhat hard  Food Insecurity: No Food Insecurity  . Worried About Charity fundraiser in the Last Year: Never true  . Ran Out of Food in the Last Year: Never true  Transportation Needs: No Transportation Needs  . Lack of Transportation (Medical): No  .  Lack of Transportation (Non-Medical): No  Physical Activity: Inactive  . Days of Exercise per Week: 0 days  . Minutes of Exercise per Session: 0 min  Stress: No Stress Concern Present  . Feeling of Stress : Not at all  Social Connections:   . Frequency of Communication with Friends and Family: Not on file  . Frequency of Social Gatherings with Friends and Family: Not on file  . Attends Religious Services: Not on file  . Active Member of Clubs or Organizations: Not on file  . Attends Archivist Meetings: Not on file  . Marital Status: Not on file    Tobacco Counseling Counseling given: Not Answered   Clinical Intake:  Pre-visit preparation completed: Yes  Pain : 0-10 Pain Score: 2  Pain Location: Foot Pain Orientation: Right, Left Pain Descriptors / Indicators: Nagging Pain Onset: More than a month ago Pain Frequency: Constant     Nutritional Status: BMI of 19-24  Normal Nutritional Risks: None Diabetes: No  How often do you need to have someone help you when you read instructions, pamphlets, or other written materials from your doctor or pharmacy?: 1 - Never What is the last grade level you completed in school?: master's degree  Diabetic? No   Interpreter Needed?: No  Information entered by :: NAllen LPN   Activities of Daily Living In your present state of health, do you have any difficulty performing the following activities: 01/22/2020 10/21/2019  Hearing? N N  Vision? N N  Difficulty concentrating or making decisions? Y N  Walking or climbing stairs? N N  Dressing or bathing? N N  Doing errands, shopping? N N  Preparing Food and eating ? N -  Using the Toilet? N -  In the past six months, have you accidently leaked urine? N -  Do you have problems with loss of bowel control? N -  Managing your Medications? N -  Managing your Finances? N -  Housekeeping or managing your Housekeeping? N -  Some recent data might be hidden    Patient Care  Team: Glendale Chard, MD as PCP - General (Internal Medicine)  Indicate any recent Medical Services you may have received from other than [facility] providers in the past year (date may be approximate).     Assessment:   This is a routine wellness examination for Alanda.  Hearing/Vision screen  Hearing Screening   125Hz  250Hz  500Hz  1000Hz  2000Hz  3000Hz  4000Hz  6000Hz  8000Hz   Right ear:           Left ear:           Vision Screening Comments: Regular eye exams, Dr. Sabra Heck  Dietary issues and exercise activities discussed: Current Exercise Habits: The patient does not participate in regular exercise at present  Goals    .  Patient Stated     09/26/2018, stay healthy    . Patient Stated     01/22/2020, lower cholesterol      Depression Screen PHQ 2/9 Scores 01/22/2020 10/21/2019 09/26/2018 03/12/2018 01/19/2018  PHQ - 2 Score 0 0 0 0 0  PHQ- 9 Score - - 1 - -    Fall Risk Fall Risk  01/22/2020 11/12/2019 09/26/2018 03/12/2018 01/19/2018  Falls in the past year? 0 0 0 0 0  Risk for fall due to : Medication side effect - Medication side effect - -  Follow up Falls evaluation completed;Education provided;Falls prevention discussed - Falls evaluation completed;Education provided;Falls prevention discussed - -    Any stairs in or around the home? Yes  If so, are there any without handrails? No  Home free of loose throw rugs in walkways, pet beds, electrical cords, etc? Yes  Adequate lighting in your home to reduce risk of falls? Yes   ASSISTIVE DEVICES UTILIZED TO PREVENT FALLS:  Life alert? No  Use of a cane, walker or w/c? No  Grab bars in the bathroom? No  Shower chair or bench in shower? Yes  Elevated toilet seat or a handicapped toilet? No   TIMED UP AND GO:  Was the test performed? No .    Gait steady and fast without use of assistive device  Cognitive Function:     6CIT Screen 01/22/2020 09/26/2018  What Year? 0 points 0 points  What month? 0 points 0 points  What time?  0 points 0 points  Count back from 20 0 points 0 points  Months in reverse 0 points 0 points  Repeat phrase 0 points 0 points  Total Score 0 0    Immunizations Immunization History  Administered Date(s) Administered  . Hepatitis B 04/06/2011  . PFIZER SARS-COV-2 Vaccination 04/21/2019, 05/14/2019  . Pneumococcal Conjugate-13 09/29/2018  . Pneumococcal Polysaccharide-23 11/13/2013  . Tdap 09/29/2018  . Zoster Recombinat (Shingrix) 06/30/2017, 09/20/2017    TDAP status: Up to date Flu Vaccine status: Declined, Education has been provided regarding the importance of this vaccine but patient still declined. Advised may receive this vaccine at local pharmacy or Health Dept. Aware to provide a copy of the vaccination record if obtained from local pharmacy or Health Dept. Verbalized acceptance and understanding. Pneumococcal vaccine status: Up to date Covid-19 vaccine status: Completed vaccines  Qualifies for Shingles Vaccine? Yes   Zostavax completed No   Shingrix Completed?: Yes  Screening Tests Health Maintenance  Topic Date Due  . INFLUENZA VACCINE  05/28/2020 (Originally 09/29/2019)  . MAMMOGRAM  02/13/2020  . COLONOSCOPY  11/03/2027  . TETANUS/TDAP  09/28/2028  . DEXA SCAN  Completed  . COVID-19 Vaccine  Completed  . Hepatitis C Screening  Completed  . PNA vac Low Risk Adult  Completed    Health Maintenance  There are no preventive care reminders to display for this patient.  Colorectal cancer screening: Completed 11/02/2017. Repeat every 10 years Mammogram status: Completed 02/14/2019. Repeat every year Bone Density status: Completed 2021.requesting report  Lung Cancer Screening: (Low Dose CT Chest recommended if Age 62-80 years, 30 pack-year currently smoking OR have quit w/in 15years.) does not qualify.   Lung Cancer Screening Referral: no  Additional Screening:  Hepatitis C Screening: does qualify; Completed 07/19/2011  Vision Screening: Recommended annual  ophthalmology exams for early detection of glaucoma and other disorders of the eye. Is the patient up to date with their annual eye exam?  Yes  Who is the  provider or what is the name of the office in which the patient attends annual eye exams? Dr. Sabra Heck If pt is not established with a provider, would they like to be referred to a provider to establish care? No .   Dental Screening: Recommended annual dental exams for proper oral hygiene  Community Resource Referral / Chronic Care Management: CRR required this visit?  Yes   CCM required this visit?  No      Plan:     I have personally reviewed and noted the following in the patient's chart:   . Medical and social history . Use of alcohol, tobacco or illicit drugs  . Current medications and supplements . Functional ability and status . Nutritional status . Physical activity . Advanced directives . List of other physicians . Hospitalizations, surgeries, and ER visits in previous 12 months . Vitals . Screenings to include cognitive, depression, and falls . Referrals and appointments  In addition, I have reviewed and discussed with patient certain preventive protocols, quality metrics, and best practice recommendations. A written personalized care plan for preventive services as well as general preventive health recommendations were provided to patient.     Kellie Simmering, LPN   [id number]   Nurse Notes:

## 2020-01-29 ENCOUNTER — Telehealth: Payer: Self-pay | Admitting: Internal Medicine

## 2020-01-29 ENCOUNTER — Encounter: Payer: Self-pay | Admitting: Internal Medicine

## 2020-01-29 NOTE — Telephone Encounter (Signed)
Virginia Delgado 01/29/2020 Called pt regarding community resource referral received. My info is [phone] please see ref notes for more details. Thank you. ° °Virginia Delgado °Care Guide, Embedded Care Coordination °[place], Care Management ° °

## 2020-01-30 DIAGNOSIS — M810 Age-related osteoporosis without current pathological fracture: Secondary | ICD-10-CM | POA: Diagnosis not present

## 2020-01-30 DIAGNOSIS — M25519 Pain in unspecified shoulder: Secondary | ICD-10-CM | POA: Diagnosis not present

## 2020-01-30 DIAGNOSIS — M8580 Other specified disorders of bone density and structure, unspecified site: Secondary | ICD-10-CM | POA: Diagnosis not present

## 2020-01-30 DIAGNOSIS — Z09 Encounter for follow-up examination after completed treatment for conditions other than malignant neoplasm: Secondary | ICD-10-CM | POA: Diagnosis not present

## 2020-02-07 DIAGNOSIS — S29012A Strain of muscle and tendon of back wall of thorax, initial encounter: Secondary | ICD-10-CM | POA: Diagnosis not present

## 2020-02-07 DIAGNOSIS — S138XXA Sprain of joints and ligaments of other parts of neck, initial encounter: Secondary | ICD-10-CM | POA: Diagnosis not present

## 2020-02-07 DIAGNOSIS — M9901 Segmental and somatic dysfunction of cervical region: Secondary | ICD-10-CM | POA: Diagnosis not present

## 2020-02-07 DIAGNOSIS — M9902 Segmental and somatic dysfunction of thoracic region: Secondary | ICD-10-CM | POA: Diagnosis not present

## 2020-02-10 ENCOUNTER — Ambulatory Visit: Payer: Medicare HMO | Admitting: Internal Medicine

## 2020-02-10 ENCOUNTER — Encounter: Payer: Self-pay | Admitting: Internal Medicine

## 2020-02-10 ENCOUNTER — Other Ambulatory Visit: Payer: Self-pay

## 2020-02-10 VITALS — BP 123/71 | HR 59 | Ht 61.0 in | Wt 130.8 lb

## 2020-02-10 DIAGNOSIS — M0580 Other rheumatoid arthritis with rheumatoid factor of unspecified site: Secondary | ICD-10-CM | POA: Diagnosis not present

## 2020-02-10 DIAGNOSIS — B182 Chronic viral hepatitis C: Secondary | ICD-10-CM

## 2020-02-10 DIAGNOSIS — R69 Illness, unspecified: Secondary | ICD-10-CM | POA: Diagnosis not present

## 2020-02-10 MED ORDER — HYDROXYCHLOROQUINE SULFATE 200 MG PO TABS: 200.0000 mg | ORAL_TABLET | Freq: Every day | ORAL | 0 refills | Status: DC

## 2020-02-10 NOTE — Progress Notes (Signed)
Office Visit Note  Patient: Virginia Delgado             Date of Birth: 03/09/1945           MRN: [id number]             PCP: Glendale Chard, MD Referring: Landis Martins, DPM Visit Date: 02/10/2020   Subjective:  Pain of the Neck, Pain and Edema of the Right Foot (x6 months), Pain and Edema of the Left Foot (x6 months), and New Patient (Initial Visit)   History of Present Illness: Virginia Delgado is a 74 y.o. female here for evaluation of positive ANA and rheumatoid factor with joint pain and swelling of both feet. She has been experiencing this increase in joint pain over about 6 months. She caught her toes and possible had some hyperextension on the left foot 8-9 months ago but does not recall anything specifically preceding her symptoms. This is primarily over the distal and medial aspects of both feet. She notices swelling in this area intermittently but has pain daily. This makes wearing regular footwear very difficult due to pain. She has also been limited in activities due to pain with walking. She has tried use of a brace without large benefit and she noticed a partial benefit when taking oral steroids briefly. She has felt some improvement with use of OTC voltaren on her feet. She has a history of hepatitis C infection for many years previously treated with Harvoni 2015 and without residual abnormal liver function. She had a grandmother with severe arthritis but no other known arthritis and autoimmune disease family history. She has had bilateral bunionectomies no other significant injuries or surgeries to her feet.  Labs reviewed 12/2019 ANA 1:40 cytoplasmic, intracellular bridge RF 27 ESR 9 Uric acid 4.9   Activities of Daily Living:  Patient reports morning stiffness for 0 minutes.   Patient Denies nocturnal pain.  Difficulty dressing/grooming: Denies Difficulty climbing stairs: Denies Difficulty getting out of chair: Denies Difficulty using hands for taps, buttons, cutlery, and/or  writing: Denies  Review of Systems  Constitutional: Negative for fatigue.  HENT: Negative for mouth sores, mouth dryness and nose dryness.   Eyes: Positive for visual disturbance. Negative for pain, itching and dryness.  Respiratory: Negative for cough, hemoptysis, shortness of breath and difficulty breathing.   Cardiovascular: Negative for chest pain, palpitations and swelling in legs/feet.  Gastrointestinal: Negative for abdominal pain, blood in stool, constipation and diarrhea.  Endocrine: Negative for increased urination.  Genitourinary: Negative for painful urination.  Musculoskeletal: Positive for arthralgias, joint pain, joint swelling, myalgias, muscle weakness, muscle tenderness and myalgias. Negative for morning stiffness.  Skin: Negative for color change, rash and redness.  Allergic/Immunologic: Negative for susceptible to infections.  Neurological: Negative for dizziness, numbness, headaches, memory loss and weakness.  Hematological: Negative for swollen glands.  Psychiatric/Behavioral: Negative for confusion and sleep disturbance.    PMFS History:  Patient Active Problem List   Diagnosis Date Noted  . Polyarthritis with positive rheumatoid factor ([place]) 02/10/2020  . Statin intolerance 11/26/2019  . Coronary artery calcification 04/05/2019  . Hypertensive nephropathy 03/12/2018  . Chronic renal disease, stage II 03/12/2018  . Estrogen deficiency 03/12/2018  . Hyperlipidemia LDL goal <70 10/20/2017  . Menopause 07/19/2011  . Hypertension   . Hepatitis C     Past Medical History:  Diagnosis Date  . Coronary artery calcification 04/05/2019   Calcium score 93rd percentile 08/2018.  Marland Kitchen Dermatitis   . Hx of hepatitis C   .  Hypertension     Family History  Problem Relation Age of Onset  . Cancer Mother        breast  . COPD Mother   . Heart disease Father   . Cancer Sister        sarcoma  . Hypertension Brother   . Ovarian cancer Maternal Grandmother   . Rheum  arthritis Paternal Grandmother   . Hypertension Sister    Past Surgical History:  Procedure Laterality Date  . BUNIONECTOMY Bilateral   . CESAREAN SECTION  1976  . CESAREAN SECTION  1979  . [place]   Social History   Social History Narrative  . Not on file   Immunization History  Administered Date(s) Administered  . Hepatitis B 04/06/2011  . PFIZER SARS-COV-2 Vaccination 04/21/2019, 05/14/2019  . Pneumococcal Conjugate-13 09/29/2018  . Pneumococcal Polysaccharide-23 11/13/2013  . Tdap 09/29/2018  . Zoster Recombinat (Shingrix) 06/30/2017, 09/20/2017     Objective: Vital Signs: BP 123/71 (BP Location: Left Arm, Patient Position: Sitting, Cuff Size: Small)   Pulse (!) 59   Ht 5' 1" (1.549 m)   Wt 130 lb 12.8 oz (59.3 kg)   BMI 24.71 kg/m    Physical Exam HENT:     Right Ear: External ear normal.     Left Ear: External ear normal.  Eyes:     Conjunctiva/sclera: Conjunctivae normal.  Cardiovascular:     Rate and Rhythm: Normal rate and regular rhythm.  Pulmonary:     Effort: Pulmonary effort is normal.     Breath sounds: Normal breath sounds.  Skin:    General: Skin is warm and dry.     Findings: No rash.  Neurological:     General: No focal deficit present.     Mental Status: She is alert.  Psychiatric:        Mood and Affect: Mood normal.     Musculoskeletal Exam:  Neck increased tone of SCM left side full ROM Elbow, wrist, fingers full range of motion no tenderness or swelling Knees patellofemoral crepitus present good ROM Ankles, MTPs full range of motion, no swelling, well healed bunionectomy scars b/l Tenderness to palpation on left foot worst around medial edge of Lisfranc joint line also between 1st and 2nd MTP, ultrasound inspection with mild local edema and hypoechogenicity of the tibialis anterior tendon just proximal to medial cuneiform insertion without significant fluid collection in tendon sheath beyond  this   Investigation: No additional findings.  Imaging: No results found.  Recent Labs: Lab Results  Component Value Date   WBC 6.3 01/03/2020   HGB 13.2 01/03/2020   PLT 257 01/03/2020   NA 143 11/12/2019   K 4.1 11/12/2019   CL 107 (H) 11/12/2019   CO2 23 11/12/2019   GLUCOSE 109 (H) 11/12/2019   BUN 15 11/12/2019   CREATININE 1.03 (H) 11/12/2019   BILITOT 0.7 10/09/2019   ALKPHOS 74 10/09/2019   AST 16 10/09/2019   ALT 13 10/09/2019   PROT 6.5 10/09/2019   ALBUMIN 4.2 10/09/2019   CALCIUM 10.5 (H) 11/12/2019   GFRAA 63 11/12/2019    Speciality Comments: No specialty comments available.  Procedures:  No procedures performed Allergies: Contrast media [iodinated diagnostic agents], Gadobutrol, Gadolinium derivatives, Lidocaine, Pravachol [pravastatin sodium], and Latex   Assessment / Plan:     Visit Diagnoses: Polyarthritis with positive rheumatoid factor ([place]) - Plan: XR Foot 2 Views Left, hydroxychloroquine (PLAQUENIL) 200 MG tablet  Virginia Delgado has joint pain of  multiple sites and moderately positive RF of 27. Her previous workup showed normal inflammatory labs and no frank swelling on exam today but ultrasound inspection is consistent with some enthesitis or tenosynovitis. Based on chronicity of symptoms and these findings would recommend trying DMARD treatment. She has history of Hep C so recommend against methotrexate as first option. Will start HCQ 262m PO daily discussed possible adverse effects including GI intolerance, skin reaction, cytopenias, and risk of retinal toxicity with need for annual examination. I also recommended she return whenever convenient for left foot xray to screen for erosive disease, none seen on prior right foot xray. Will f/u in about 4 weeks to check on medication tolerance and symptoms.  Chronic hepatitis C without hepatic coma (HCC)  Previously treated with normal LFTs on most recent labs but will recommend avoiding hepatotoxic  medications where able so not MTX today.  Orders: Orders Placed This Encounter  Procedures  . XR Foot 2 Views Left   Meds ordered this encounter  Medications  . hydroxychloroquine (PLAQUENIL) 200 MG tablet    Sig: Take 1 tablet (200 mg total) by mouth daily.    Dispense:  60 tablet    Refill:  0     Follow-Up Instructions: Return in about 4 weeks (around 03/09/2020) for New Ra f/u.   CCollier Salina MD  Note - This record has been created using [employer]  Chart creation errors have been sought, but may not always  have been located. Such creation errors do not reflect on  the standard of medical care.

## 2020-02-10 NOTE — Patient Instructions (Addendum)
I recommend returning whenever convenient for an xray of the left foot to confirm there is no erosive joint damage related to inflammatory arthritis that would be a more urgent indication to treat aggressively.   Hydroxychloroquine tablets What is this medicine? HYDROXYCHLOROQUINE (hye drox ee KLOR oh [place]) is used to treat rheumatoid arthritis and systemic lupus erythematosus. It is also used to treat malaria. This medicine may be used for other purposes; ask your health care provider or pharmacist if you have questions. COMMON BRAND NAME(S): Plaquenil, Quineprox What should I tell my health care provider before I take this medicine? They need to know if you have any of these conditions:  diabetes  eye disease, vision problems  G6PD deficiency  heart disease  history of irregular heartbeat  if you often drink alcohol  kidney disease  liver disease  porphyria  psoriasis  an unusual or allergic reaction to chloroquine, hydroxychloroquine, other medicines, foods, dyes, or preservatives  pregnant or trying to get pregnant  breast-feeding How should I use this medicine? Take this medicine by mouth with a glass of water. Follow the directions on the prescription label. Do not cut, crush or chew this medicine. Swallow the tablets whole. Take this medicine with food. Avoid taking antacids within 4 hours of taking this medicine. It is best to separate these medicines by at least 4 hours. Take your medicine at regular intervals. Do not take it more often than directed. Take all of your medicine as directed even if you think you are better. Do not skip doses or stop your medicine early. Talk to your pediatrician regarding the use of this medicine in children. While this drug may be prescribed for selected conditions, precautions do apply. Overdosage: If you think you have taken too much of this medicine contact a poison control center or emergency room at once. NOTE: This medicine is  only for you. Do not share this medicine with others. What if I miss a dose? If you miss a dose, take it as soon as you can. If it is almost time for your next dose, take only that dose. Do not take double or extra doses. What may interact with this medicine? Do not take this medicine with any of the following medications:  cisapride  dronedarone  pimozide  thioridazine This medicine may also interact with the following medications:  ampicillin  antacids  cimetidine  cyclosporine  digoxin  kaolin  medicines for diabetes, like insulin, glipizide, glyburide  medicines for seizures like carbamazepine, phenobarbital, phenytoin  mefloquine  methotrexate  other medicines that prolong the QT interval (cause an abnormal heart rhythm)  praziquantel This list may not describe all possible interactions. Give your health care provider a list of all the medicines, herbs, non-prescription drugs, or dietary supplements you use. Also tell them if you smoke, drink alcohol, or use illegal drugs. Some items may interact with your medicine. What should I watch for while using this medicine? Visit your health care professional for regular checks on your progress. Tell your health care professional if your symptoms do not start to get better or if they get worse. You may need blood work done while you are taking this medicine. If you take other medicines that can affect heart rhythm, you may need more testing. Talk to your health care professional if you have questions. Your vision may be tested before and during use of this medicine. Tell your health care professional right away if you have any change in your eyesight.  What side effects may I notice from receiving this medicine? Side effects that you should report to your doctor or health care professional as soon as possible:  allergic reactions like skin rash, itching or hives, swelling of the face, lips, or tongue  changes in  vision  decreased hearing or ringing of the ears  muscle weakness  redness, blistering, peeling or loosening of the skin, including inside the mouth  sensitivity to light  signs and symptoms of a dangerous change in heartbeat or heart rhythm like chest pain; dizziness; fast or irregular heartbeat; palpitations; feeling faint or lightheaded, falls; breathing problems  signs and symptoms of liver injury like dark yellow or brown urine; general ill feeling or flu-like symptoms; light-colored stools; loss of appetite; nausea; right upper belly pain; unusually weak or tired; yellowing of the eyes or skin  signs and symptoms of low blood sugar such as feeling anxious; confusion; dizziness; increased hunger; unusually weak or tired; sweating; shakiness; cold; irritable; headache; blurred vision; fast heartbeat; loss of consciousness  suicidal thoughts  uncontrollable head, mouth, neck, arm, or leg movements Side effects that usually do not require medical attention (report to your doctor or health care professional if they continue or are bothersome):  diarrhea  dizziness  hair loss  headache  irritable  loss of appetite  nausea, vomiting  stomach pain This list may not describe all possible side effects. Call your doctor for medical advice about side effects. You may report side effects to FDA at 1-800-FDA-1088. Where should I keep my medicine? Keep out of the reach of children. Store at room temperature between 15 and 30 degrees C (59 and 86 degrees F). Protect from moisture and light. Throw away any unused medicine after the expiration date.    Rheumatoid Arthritis Rheumatoid arthritis (RA) is a long-term (chronic) disease that causes inflammation in your joints. RA may start slowly. It most often affects the small joints of the hands and feet. Usually, the same joints are affected on both sides of your body. Inflammation from RA can also affect other parts of your body,  including your heart, eyes, or lungs. There is no cure for RA, but medicines can help your symptoms and halt or slow down the progression of the disease. What are the causes? RA is an autoimmune disease. When you have an autoimmune disease, your body's defense system (immune system) mistakenly attacks healthy body tissues. The exact cause of RA is not known. What increases the risk? You are more likely to develop this condition if you:  Are a woman.  Have a family history of RA or other autoimmune diseases.  Have a history of smoking.  Are obese.  Have been exposed to pollutants or chemicals. What are the signs or symptoms? The first symptom of this condition may be morning stiffness that lasts longer than 30 minutes.  Symptoms usually start gradually. They are often worse in the morning. As RA progresses, symptoms may include:  Pain, stiffness, swelling, warmth, and tenderness in joints on both sides of your body.  Loss of energy.  Loss of appetite.  Weight loss.  Low-grade fever.  Dry eyes and dry mouth.  Firm lumps (rheumatoid nodules) that grow beneath your skin in areas such as your forearm bones near your elbows and on your hands.  Changes in the appearance of joints (deformity) and loss of joint function. Symptoms of this condition vary from person to person.  Symptoms of RA often come and go.  Sometimes, symptoms  get worse for a period of time. These are called flares. How is this diagnosed? This condition is diagnosed based on your symptoms, medical history, and physical exam.  You may have X-rays or an MRI to check for the type of joint changes that are caused by RA. You may also have blood tests to look for:  Proteins (antibodies) that your immune system may make if you have RA. These include rheumatoid factor (RF) and anti-CCP. ? When blood tests show these proteins, you are said to have "seropositive RA." ? When blood tests do not show these proteins,  you may have "seronegative RA."  Inflammation in your blood.  A low number of red blood cells (anemia). How is this treated? The goals of treatment are to relieve pain, reduce inflammation, and slow down or stop joint damage and disability. Treatment may include:  Lifestyle changes. It is important to rest as needed, eat a healthy diet, and exercise.  Medicines. Your health care provider may adjust your medicines every 3 months until treatment goals are reached. Common medicines include: ? Pain relievers (analgesics). ? Corticosteroids and NSAIDs to reduce inflammation. ? Disease-modifying antirheumatic drugs (DMARDs) to try to slow the course of the disease. ? Biologic response modifiers to reduce inflammation and damage.  Physical therapy and occupational therapy.  Surgery, if you have severe joint damage. Joint replacement or fusing of joints may be needed. Your health care provider will work with you to identify the best treatment option for you based on assessment of the overall disease activity in your body. Follow these instructions at home: Activity  Return to your normal activities as told by your health care provider. Ask your health care provider what activities are safe for you.  Rest when you are having a flare.  Start an exercise program as told by your health care provider. General instructions  Keep all follow-up visits as told by your health care provider. This is important.  Take over-the-counter and prescription medicines only as told by your health care provider. Where to find more information  [employer] of Rheumatology: www.rheumatology.[place]: www.arthritis.org Contact a health care provider if:  You have a flare-up of RA symptoms.  You have a fever.  You have side effects from your medicines. Get help right away if:  You have chest pain.  You have trouble breathing.  You quickly develop a hot, painful joint that is  more severe than your usual joint aches. Summary  Rheumatoid arthritis (RA) is a long-term (chronic) disease that causes inflammation in your joints.  RA is an autoimmune disease.  The goals of treatment are to relieve pain, reduce inflammation, and slow down or stop joint damage and disability. This information is not intended to replace advice given to you by your health care provider. Make sure you discuss any questions you have with your health care provider. Document Revised: 08/28/2018 Document Reviewed: 10/17/2017 Elsevier Patient Education  2020 [employer].

## 2020-02-12 ENCOUNTER — Ambulatory Visit: Payer: Medicare HMO | Admitting: Internal Medicine

## 2020-03-04 ENCOUNTER — Other Ambulatory Visit: Payer: Self-pay | Admitting: Cardiovascular Disease

## 2020-03-08 NOTE — Progress Notes (Signed)
Office Visit Note  Patient: Virginia Delgado             Date of Birth: 1945/07/31           MRN: [id number]             PCP: Glendale Chard, MD Referring: Glendale Chard, MD Visit Date: 03/09/2020   Subjective:   History of Present Illness: Virginia Delgado is a 75 y.o. female here for follow up of seropositive RA after starting HCQ 200mg  4 weeks ago. She primarily complained of foot and ankle pain at that time no frank synovitis but some evidence for enthesitis/tendonitis. Methotrexate was not recommended as first line with history of chronic hepatitis. At this time she feels some improvement in pain. She decreased HCQ from daily to every other day due to mild intolerance and now feels it is well tolerated.   Review of Systems  Constitutional: Negative for fatigue.  HENT: Negative for mouth sores, mouth dryness and nose dryness.   Eyes: Positive for visual disturbance. Negative for pain, itching and dryness.  Respiratory: Negative for cough, hemoptysis, shortness of breath and difficulty breathing.   Cardiovascular: Negative for chest pain, palpitations and swelling in legs/feet.  Gastrointestinal: Negative for abdominal pain, blood in stool, constipation and diarrhea.  Endocrine: Negative for increased urination.  Genitourinary: Negative for painful urination.  Musculoskeletal: Negative for arthralgias, joint pain, joint swelling, myalgias, muscle weakness, morning stiffness, muscle tenderness and myalgias.  Skin: Negative for color change, rash and redness.  Allergic/Immunologic: Negative for susceptible to infections.  Neurological: Negative for dizziness, numbness, headaches, memory loss and weakness.  Hematological: Negative for swollen glands.  Psychiatric/Behavioral: Negative for confusion and sleep disturbance.    PMFS History:  Patient Active Problem List   Diagnosis Date Noted  . Polyarthritis with positive rheumatoid factor ([place]) 02/10/2020  . Statin intolerance 11/26/2019  .  Coronary artery calcification 04/05/2019  . Hypertensive nephropathy 03/12/2018  . Chronic renal disease, stage II 03/12/2018  . Estrogen deficiency 03/12/2018  . Hyperlipidemia LDL goal <70 10/20/2017  . Menopause 07/19/2011  . Hypertension   . Hepatitis C     Past Medical History:  Diagnosis Date  . Coronary artery calcification 04/05/2019   Calcium score 93rd percentile 08/2018.  Marland Kitchen Dermatitis   . Hx of hepatitis C   . Hypertension     Family History  Problem Relation Age of Onset  . Cancer Mother        breast  . COPD Mother   . Heart disease Father   . Cancer Sister        sarcoma  . Hypertension Brother   . Ovarian cancer Maternal Grandmother   . Rheum arthritis Paternal Grandmother   . Hypertension Sister    Past Surgical History:  Procedure Laterality Date  . BUNIONECTOMY Bilateral   . CESAREAN SECTION  1976  . CESAREAN SECTION  1979  . [place]   Social History   Social History Narrative  . Not on file   Immunization History  Administered Date(s) Administered  . Hepatitis B 04/06/2011  . PFIZER SARS-COV-2 Vaccination 04/21/2019, 05/14/2019, 02/10/2020  . Pneumococcal Conjugate-13 09/29/2018  . Pneumococcal Polysaccharide-23 11/13/2013  . Tdap 09/29/2018  . Zoster Recombinat (Shingrix) 06/30/2017, 09/20/2017     Objective: Vital Signs: BP 125/81 (BP Location: Right Arm, Patient Position: Sitting, Cuff Size: Normal)   Pulse 60   Ht 5' 0.25" (1.53 m)   Wt 129 lb 9.6 oz (58.8 kg)  BMI 25.10 kg/m    Physical Exam Skin:    General: Skin is warm and dry.     Findings: No rash.  Neurological:     Mental Status: She is alert.  Psychiatric:        Mood and Affect: Mood normal.      Musculoskeletal Exam:  Elbow, wrist, fingers full range of motion no tenderness or swelling Knees patellofemoral crepitus present good ROM no tenderness Ankles, MTPs full range of motion, no tenderness over midfoot on medial and lateral  sides   Investigation: No additional findings.  Imaging: No results found.  Recent Labs: Lab Results  Component Value Date   WBC 6.3 01/03/2020   HGB 13.2 01/03/2020   PLT 257 01/03/2020   NA 143 11/12/2019   K 4.1 11/12/2019   CL 107 (H) 11/12/2019   CO2 23 11/12/2019   GLUCOSE 109 (H) 11/12/2019   BUN 15 11/12/2019   CREATININE 1.03 (H) 11/12/2019   BILITOT 0.7 10/09/2019   ALKPHOS 74 10/09/2019   AST 16 10/09/2019   ALT 13 10/09/2019   PROT 6.5 10/09/2019   ALBUMIN 4.2 10/09/2019   CALCIUM 10.5 (H) 11/12/2019   GFRAA 63 11/12/2019    Speciality Comments: No specialty comments available.  Procedures:  No procedures performed Allergies: Contrast media [iodinated diagnostic agents], Gadobutrol, Gadolinium derivatives, Lidocaine, Pravachol [pravastatin sodium], and Latex   Assessment / Plan:     Visit Diagnoses: Polyarthritis with positive rheumatoid factor ([place]) - Plan: diclofenac Sodium (VOLTAREN) 1 % GEL, hydroxychloroquine (PLAQUENIL) 200 MG tablet  Primarily lower extremity symptoms especially ankles and feet at this time. Tolerating HCQ fine without issue and seems to experience partial benefit so far. Topical NSAIDs also beneficial for as needed joint pain. Plan to continue HCQ monotherapy 200mg  every other day. She has an eye doctor and upcoming f/u in the next few weeks recommended they obtain OCT retinal toxicity screening baseline.   Orders: No orders of the defined types were placed in this encounter.  Meds ordered this encounter  Medications  . diclofenac Sodium (VOLTAREN) 1 % GEL    Sig: Apply 2-4 grams to affected joint up to 4 times daily as needed    Dispense:  50 g    Refill:  1  . hydroxychloroquine (PLAQUENIL) 200 MG tablet    Sig: Take 1 tablet (200 mg total) by mouth every other day.    Dispense:  45 tablet    Refill:  1     Follow-Up Instructions: Return in about 6 months (around 09/06/2020).   Collier Salina, MD  Note - This  record has been created using [employer].  Chart creation errors have been sought, but may not always  have been located. Such creation errors do not reflect on  the standard of medical care.

## 2020-03-09 ENCOUNTER — Ambulatory Visit (INDEPENDENT_AMBULATORY_CARE_PROVIDER_SITE_OTHER): Payer: Medicare HMO | Admitting: Internal Medicine

## 2020-03-09 ENCOUNTER — Encounter: Payer: Self-pay | Admitting: Internal Medicine

## 2020-03-09 ENCOUNTER — Other Ambulatory Visit: Payer: Self-pay

## 2020-03-09 VITALS — BP 125/81 | HR 60 | Ht 60.25 in | Wt 129.6 lb

## 2020-03-09 DIAGNOSIS — M0580 Other rheumatoid arthritis with rheumatoid factor of unspecified site: Secondary | ICD-10-CM

## 2020-03-09 MED ORDER — HYDROXYCHLOROQUINE SULFATE 200 MG PO TABS: 200.0000 mg | ORAL_TABLET | ORAL | 1 refills | Status: DC

## 2020-03-09 MED ORDER — DICLOFENAC SODIUM 1 % EX GEL: CUTANEOUS | 1 refills | Status: DC

## 2020-03-10 DIAGNOSIS — Z1231 Encounter for screening mammogram for malignant neoplasm of breast: Secondary | ICD-10-CM | POA: Diagnosis not present

## 2020-03-10 DIAGNOSIS — Z803 Family history of malignant neoplasm of breast: Secondary | ICD-10-CM | POA: Diagnosis not present

## 2020-03-10 LAB — HM MAMMOGRAPHY

## 2020-04-08 ENCOUNTER — Encounter: Payer: Self-pay | Admitting: Internal Medicine

## 2020-04-08 DIAGNOSIS — I10 Essential (primary) hypertension: Secondary | ICD-10-CM

## 2020-04-08 DIAGNOSIS — E7800 Pure hypercholesterolemia, unspecified: Secondary | ICD-10-CM

## 2020-04-13 DIAGNOSIS — E7800 Pure hypercholesterolemia, unspecified: Secondary | ICD-10-CM | POA: Diagnosis not present

## 2020-04-13 DIAGNOSIS — I10 Essential (primary) hypertension: Secondary | ICD-10-CM | POA: Diagnosis not present

## 2020-04-14 LAB — COMPREHENSIVE METABOLIC PANEL
ALT: 12 IU/L (ref 0–32)
AST: 17 IU/L (ref 0–40)
Albumin/Globulin Ratio: 1.8 (ref 1.2–2.2)
Albumin: 4.8 g/dL — ABNORMAL HIGH (ref 3.7–4.7)
Alkaline Phosphatase: 83 IU/L (ref 44–121)
BUN/Creatinine Ratio: 15 (ref 12–28)
BUN: 12 mg/dL (ref 8–27)
Bilirubin Total: 1.1 mg/dL (ref 0.0–1.2)
CO2: 26 mmol/L (ref 20–29)
Calcium: 10.7 mg/dL — ABNORMAL HIGH (ref 8.7–10.3)
Chloride: 104 mmol/L (ref 96–106)
Creatinine, Ser: 0.82 mg/dL (ref 0.57–1.00)
GFR calc Af Amer: 83 mL/min/{1.73_m2} (ref >59)
GFR calc non Af Amer: 72 mL/min/{1.73_m2} (ref >59)
Globulin, Total: 2.6 g/dL (ref 1.5–4.5)
Glucose: 93 mg/dL (ref 65–99)
Potassium: 3.7 mmol/L (ref 3.5–5.2)
Sodium: 143 mmol/L (ref 134–144)
Total Protein: 7.4 g/dL (ref 6.0–8.5)

## 2020-04-14 LAB — LIPID PANEL
Chol/HDL Ratio: 2.7 ratio (ref 0.0–4.4)
Cholesterol, Total: 219 mg/dL — ABNORMAL HIGH (ref 100–199)
HDL: 82 mg/dL (ref >39)
LDL Chol Calc (NIH): 125 mg/dL — ABNORMAL HIGH (ref 0–99)
Triglycerides: 68 mg/dL (ref 0–149)
VLDL Cholesterol Cal: 12 mg/dL (ref 5–40)

## 2020-04-17 ENCOUNTER — Encounter: Payer: Self-pay | Admitting: Radiology

## 2020-04-17 ENCOUNTER — Other Ambulatory Visit: Payer: Self-pay

## 2020-04-17 ENCOUNTER — Ambulatory Visit: Payer: Medicare HMO

## 2020-04-17 ENCOUNTER — Ambulatory Visit: Payer: Medicare HMO | Admitting: Cardiovascular Disease

## 2020-04-17 ENCOUNTER — Encounter: Payer: Self-pay | Admitting: Cardiovascular Disease

## 2020-04-17 ENCOUNTER — Other Ambulatory Visit (HOSPITAL_COMMUNITY): Payer: Medicare HMO

## 2020-04-17 ENCOUNTER — Other Ambulatory Visit (HOSPITAL_COMMUNITY)
Admission: RE | Admit: 2020-04-17 | Discharge: 2020-04-17 | Disposition: A | Discharge status: Home / Self Care | Payer: Medicare HMO | Source: Ambulatory Visit | Attending: Cardiovascular Disease | Admitting: Cardiovascular Disease

## 2020-04-17 VITALS — BP 126/74 | HR 70 | Ht 60.25 in | Wt 127.0 lb

## 2020-04-17 DIAGNOSIS — N182 Chronic kidney disease, stage 2 (mild): Secondary | ICD-10-CM | POA: Diagnosis not present

## 2020-04-17 DIAGNOSIS — I10 Essential (primary) hypertension: Secondary | ICD-10-CM

## 2020-04-17 DIAGNOSIS — Z01812 Encounter for preprocedural laboratory examination: Secondary | ICD-10-CM

## 2020-04-17 DIAGNOSIS — E785 Hyperlipidemia, unspecified: Secondary | ICD-10-CM | POA: Diagnosis not present

## 2020-04-17 DIAGNOSIS — Z20822 Contact with and (suspected) exposure to covid-19: Secondary | ICD-10-CM | POA: Insufficient documentation

## 2020-04-17 DIAGNOSIS — R002 Palpitations: Secondary | ICD-10-CM

## 2020-04-17 DIAGNOSIS — R079 Chest pain, unspecified: Secondary | ICD-10-CM | POA: Diagnosis not present

## 2020-04-17 DIAGNOSIS — I2510 Atherosclerotic heart disease of native coronary artery without angina pectoris: Secondary | ICD-10-CM | POA: Diagnosis not present

## 2020-04-17 DIAGNOSIS — I2584 Coronary atherosclerosis due to calcified coronary lesion: Secondary | ICD-10-CM | POA: Diagnosis not present

## 2020-04-17 LAB — CBC WITH DIFFERENTIAL/PLATELET
Basophils Absolute: 0 10*3/uL (ref 0.0–0.2)
Basos: 0 %
EOS (ABSOLUTE): 0.1 10*3/uL (ref 0.0–0.4)
Eos: 1 %
Hematocrit: 39.1 % (ref 34.0–46.6)
Hemoglobin: 13.2 g/dL (ref 11.1–15.9)
Immature Grans (Abs): 0 10*3/uL (ref 0.0–0.1)
Immature Granulocytes: 0 %
Lymphocytes Absolute: 1.7 10*3/uL (ref 0.7–3.1)
Lymphs: 29 %
MCH: 30.1 pg (ref 26.6–33.0)
MCHC: 33.8 g/dL (ref 31.5–35.7)
MCV: 89 fL (ref 79–97)
Monocytes Absolute: 0.6 10*3/uL (ref 0.1–0.9)
Monocytes: 10 %
Neutrophils Absolute: 3.5 10*3/uL (ref 1.4–7.0)
Neutrophils: 60 %
Platelets: 214 10*3/uL (ref 150–450)
RBC: 4.39 x10E6/uL (ref 3.77–5.28)
RDW: 11.7 % (ref 11.7–15.4)
WBC: 5.9 10*3/uL (ref 3.4–10.8)

## 2020-04-17 LAB — TSH: TSH: 1.29 u[IU]/mL (ref 0.450–4.500)

## 2020-04-17 MED ORDER — NITROGLYCERIN 0.4 MG SL SUBL: 0.4000 mg | SUBLINGUAL_TABLET | SUBLINGUAL | 99 refills | Status: DC | PRN

## 2020-04-17 MED ORDER — PREDNISONE 50 MG PO TABS: ORAL_TABLET | ORAL | 0 refills | Status: DC

## 2020-04-17 NOTE — Patient Instructions (Addendum)
Medication Instructions:  Use your NTG under your tongue for recurrent chest pain. May take one tablet every 5 minutes. If you are still having discomfort after 3 tablets in 15 minutes, call 911.  TAKE PREDNISONE 50 MG AS BELOW  Thirteen hours prior to cath  Seven hours prior to cath  And prior to leaving home please take last dose of Prednisone 50mg  and Benadryl 50mg  by mouth.  *If you need a refill on your cardiac medications before your next appointment, please call your pharmacy*  Lab Work: CBC/TSH [place] AT 1:10 PM  If you have labs (blood work) drawn today and your tests are completely normal, you will receive your results only by: Marland Kitchen MyChart Message (if you have MyChart) OR . A paper copy in the mail If you have any lab test that is abnormal or we need to change your treatment, we will call you to review the results.  Testing/Procedures: 3 DAY ZIO PATCH   Your physician has requested that you have a carotid duplex. This test is an ultrasound of the carotid arteries in your neck. It looks at blood flow through these arteries that supply the brain with blood. Allow one hour for this exam. There are no restrictions or special instructions.  Your physician has requested that you have a cardiac catheterization. Cardiac catheterization is used to diagnose and/or treat various heart conditions. Doctors may recommend this procedure for a number of different reasons. The most common reason is to evaluate chest pain. Chest pain can be a symptom of coronary artery disease (CAD), and cardiac catheterization can show whether plaque is narrowing or blocking your heart's arteries. This procedure is also used to evaluate the valves, as well as measure the blood flow and oxygen levels in different parts of your heart. For further information please visit [email or web address]. Please follow instruction sheet, as given.  Follow-Up: At [facility], you and your health needs are  our priority.  As part of our continuing mission to provide you with exceptional heart care, we have created designated Provider Care Teams.  These Care Teams include your primary Cardiologist (physician) and Advanced Practice Providers (APPs -  Physician Assistants and Nurse Practitioners) who all work together to provide you with the care you need, when you need it.  We recommend signing up for the patient portal called "MyChart".  Sign up information is provided on this After Visit Summary.  MyChart is used to connect with patients for Virtual Visits (Telemedicine).  Patients are able to view lab/test results, encounter notes, upcoming appointments, etc.  Non-urgent messages can be sent to your provider as well.   To learn more about what you can do with MyChart, go to [email or web address].    Your next appointment:   2 month(s)  The format for your next appointment:   In Person  Provider:   You may see DR [facility]  or one of the following Advanced Practice Providers on your designated Care Team:    Kerin Ransom, PA-C  [place], [place]  Coletta Memos, [place]  Other Instructions     [place] [address] [place] [address] Dept: [phone] Loc: [phone]  Autym Gangl  04/17/2020  You are scheduled for a Cardiac Catheterization on Tuesday, February 22 with Dr. Shelva Majestic.  1. Please arrive at the [facility] (Main Entrance A) at [facility]: [address] [place], [place] [address] at  8:30 AM (This time is two hours before your procedure to ensure your preparation). Free valet parking service is available.   Special note: Every effort is made to have your procedure done on time. Please understand that emergencies sometimes delay scheduled procedures.  2. Diet: Do not eat solid foods after midnight.  The patient may have clear liquids until 5am upon the day of the  procedure.  3. Labs: You will need to have blood drawn TODAY.  COVID SCREENING TODAY AT 1:10 PM  4. Medication instructions in preparation for your procedure:   Contrast Allergy: Yes, Please take Prednisone 50mg  by mouth at: Thirteen hours prior to cath  Seven hours prior to cath  And prior to leaving home please take last dose of Prednisone 50mg  and Benadryl 50mg  by mouth.  NO LOSARTAN HCT MORNING OF PROCEDURE   On the morning of your procedure, take your Aspirin and any morning medicines NOT listed above.  You may use sips of water.  5. Plan for one night stay--bring personal belongings. 6. Bring a current list of your medications and current insurance cards. 7. You MUST have a responsible person to drive you home. 8. Someone MUST be with you the first 24 hours after you arrive home or your discharge will be delayed. 9. Please wear clothes that are easy to get on and off and wear slip-on shoes.  Thank you for allowing [place] to care for you!   --  Invasive Cardiovascular services  ZIO XT- Long Term Monitor Instructions   Your physician has requested you wear your ZIO patch monitor__3__days.   This is a single patch monitor.  [employer] supplies one patch monitor per enrollment.  Additional stickers are not available.   Please do not apply patch if you will be having a Nuclear Stress Test, Echocardiogram, Cardiac CT, MRI, or Chest Xray during the time frame you would be wearing the monitor. The patch cannot be worn during these tests.  You cannot remove and re-apply the ZIO XT patch monitor.   Your ZIO patch monitor will be sent USPS Priority mail from [facility] directly to your home address. The monitor may also be mailed to a PO BOX if home delivery is not available.   It may take 3-5 days to receive your monitor after you have been enrolled.   Once you have received you monitor, please review enclosed instructions.  Your monitor has already been registered  assigning a specific monitor serial # to you.   Applying the monitor   Shave hair from upper left chest.   Hold abrader disc by orange tab.  Rub abrader in 40 strokes over left upper chest as indicated in your monitor instructions.   Clean area with 4 enclosed alcohol pads .  Use all pads to assure are is cleaned thoroughly.  Let dry.   Apply patch as indicated in monitor instructions.  Patch will be place under collarbone on left side of chest with arrow pointing upward.   Rub patch adhesive wings for 2 minutes.Remove white label marked "1".  Remove white label marked "2".  Rub patch adhesive wings for 2 additional minutes.   While looking in a mirror, press and release button in center of patch.  A small green light will flash 3-4 times .  This will be your only indicator the monitor has been turned on.     Do not shower for the first 24 hours.  You may shower after the first 24 hours.  Press button if you feel a symptom. You will hear a small click.  Record Date, Time and Symptom in the Patient Log Book.   When you are ready to remove patch, follow instructions on last 2 pages of Patient Log Book.  Stick patch monitor onto last page of Patient Log Book.   Place Patient Log Book in [place] box.  Use locking tab on box and tape box closed securely.  The [employer] has [employer] on it.  Please place in mailbox as soon as possible.  Your physician should have your test results approximately 7 days after the monitor has been mailed back to [facility].   Call [place] at [phone] if you have questions regarding your ZIO XT patch monitor.  Call them immediately if you see an orange light blinking on your monitor.   If your monitor falls off in less than 4 days contact our Monitor department at [phone].  If your monitor becomes loose or falls off after 4 days call [employer] at [phone] for suggestions on securing your monitor.

## 2020-04-17 NOTE — Progress Notes (Signed)
Cardiology Office Note   Date:  04/19/2020   ID:  Virginia Delgado, DOB 1945/12/13, MRN [id number]  PCP:  Glendale Chard, MD  Cardiologist:  No primary care provider on file.  Electrophysiologist:  None   Evaluation Performed:  Follow-Up Visit  Chief Complaint: Hyperlipidemia  History of Present Illness:    Virginia Delgado is a 75 y.o. female with asymptomatic coronary calcification, hypertension, hyperlipidemia and Hepatitis C s/p treatment who presents for follow up. She was initially seen 06/2015 for  management of hypertension.   She was previously treated with HCTZ and metoprolol and her BP was well-controlled until 05/2015.  Virginia Delgado was seen in the ED on 4/27.  Her BP was in the 200s/110s.  She was started on amlodipine and received ativan.  Head CT was negative for stroke.  She followed up with her PCP, Dr. Glendale Chard, who started her on Edarbi and amlodipine.  She was seen in clinic 07/06/15, at which time metoprolol was switched to carvedilol.  Renal artery Doppler was negative for stenosis. She also had a exercise Cardiolite that was negative for ischemia.  She achieved 8.5 METS. Carvedilol was stopped due to fatigue and amlodipine was increased.  She saw Dr. Baird Cancer and was switched from amlodipine to [facility] because she thought her body was immune to amlodipine.  At her last appointment Virginia Delgado was switched to HCTZ/losartan.  She followed up with our pharmacist 09/2017 and her BP was above goal so amlodipine was added back.   Virginia Delgado ad a coronary calcium score on 08/2018 that was 93rd percentile for age and gender.  She was hesitant to start statins and prefers to work on diet and exercise.   She lost 10 lb by switching to a plant based diet.   She started back exercising.  She improved her cholesterol significantly but still is not at goal so pravastatin was started 3 days/week. She noticed that she was cognitively foggy and unstable so she stopped taking it.  For the last week she has  noted daily chest pain.  It is gripping discomfort that occurs randomly laying down or when walking.  She can hear the pulsing in her head when she lays down at night.  At times her heart has been going as fast as the 100s and occurs daily.  It lasts for about a minute.  Her BP at home has been variable.  It has been in the in 130s-160s/90s.  She saw her chiropractor and her BP was OK.  They noted that she had calcification in her carotids.  She has been walking for exercise and doesn't have chest pain with that but is afraid to run.  She notes that she has been celebrating her birthday and her diet has been poor.  She thinks that this is why her lipids are worse.  She also reports continued mental fogginess.    Past Medical History:  Diagnosis Date  . Coronary artery calcification 04/05/2019   Calcium score 93rd percentile 08/2018.  Marland Kitchen Dermatitis   . Hx of hepatitis C   . Hypertension    Past Surgical History:  Procedure Laterality Date  . BUNIONECTOMY Bilateral   . CESAREAN SECTION  1976  . CESAREAN SECTION  1979  . UMBILICAL HERNIA REPAIR  1981     Current Meds  Medication Sig  . amLODipine (NORVASC) 10 MG tablet TAKE 1 TABLET BY MOUTH EVERY DAY (Patient taking differently: Take 10 mg by mouth daily.)  . aspirin 81  MG tablet Take 81 mg by mouth at bedtime.  . Cholecalciferol (VITAMIN D) 50 MCG (2000 UT) tablet Take 4,000 Units by mouth daily.  . diclofenac Sodium (VOLTAREN) 1 % GEL Apply 2-4 grams to affected joint up to 4 times daily as needed (Patient taking differently: Apply 2-4 g topically 4 (four) times daily as needed (pain). Apply 2-4 grams to affected joint up to 4 times daily as needed)  . losartan-hydrochlorothiazide (HYZAAR) 50-12.5 MG tablet TAKE 1 TABLET BY MOUTH EVERY DAY (Patient taking differently: Take 1 tablet by mouth daily.)  . nitroGLYCERIN (NITROSTAT) 0.4 MG SL tablet Place 1 tablet (0.4 mg total) under the tongue every 5 (five) minutes as needed for chest pain.  .  NONI, MORINDA CITRIFOLIA, PO Take 600 mg by mouth 2 (two) times a week.  . predniSONE (DELTASONE) 50 MG tablet TAKE 1 TABLET 13 HOURS, 7 HOURS, AND PRIOR TO LEAVING FOR PROCEDURE  . [DISCONTINUED] co-enzyme Q-10 50 MG capsule Take 50 mg by mouth daily.  . [DISCONTINUED] magnesium 30 MG tablet Take 30 mg by mouth daily.      Allergies:   Contrast media [iodinated diagnostic agents], Gadobutrol, Gadolinium derivatives, Lidocaine, Pravachol [pravastatin sodium], and Latex   Social History   Tobacco Use  . Smoking status: Never Smoker  . Smokeless tobacco: Never Used  Vaping Use  . Vaping Use: Never used  Substance Use Topics  . Alcohol use: Yes    Comment: social  . Drug use: No     Family Hx: The patient's family history includes COPD in her mother; Cancer in her mother and sister; Heart disease in her father; Hypertension in her brother and sister; Ovarian cancer in her maternal grandmother; Rheum arthritis in her paternal grandmother.  ROS:   Please see the history of present illness.    All other systems reviewed and are negative.   Prior CV studies:   The following studies were reviewed today:  Renal artery ultrasound 07/09/15: Normal renal arteries  Lexiscan Cardiolite 07/17/15:  Nuclear stress EF: 64%.  The left ventricular ejection fraction is normal (55-65%).  Blood pressure demonstrated a hypertensive response to exercise.  ST segment depression was noted during stress in the II, III, aVF, V5 and V6 leads.  The study is normal  Labs/Other Tests and Data Reviewed:    EKG:  An ECG dated 04/17/20 was personally reviewed today and demonstrated:  sinus rhythm.  Rate 66 bpm  Recent Labs: 04/13/2020: ALT 12; BUN 12; Creatinine, Ser 0.82; Potassium 3.7; Sodium 143 04/17/2020: Hemoglobin 13.2; Platelets 214; TSH 1.290   Recent Lipid Panel Lab Results  Component Value Date/Time   CHOL 219 (H) 04/13/2020 09:29 AM   TRIG 68 04/13/2020 09:29 AM   HDL 82 04/13/2020  09:29 AM   CHOLHDL 2.7 04/13/2020 09:29 AM   LDLCALC 125 (H) 04/13/2020 09:29 AM   Recent Labs: 03/21/2018: ALT 14; BUN 10; Creatinine, Ser 1.02; Potassium 3.8; Sodium 143   05/20/15: Hemoglobin A1c 5.7% Total cholesterol 238, tri 96, HDL 80, LDL 139 AST 19 ALT 14  09/06/17:  Total cholesterol 254, triglycerides 75, HDL 85, LDL 154 Hemoglobin 13.6 Creatinine 1.04 Potassium 3.9  Wt Readings from Last 3 Encounters:  04/17/20 127 lb (57.6 kg)  03/09/20 129 lb 9.6 oz (58.8 kg)  02/10/20 130 lb 12.8 oz (59.3 kg)     Objective:    VS:  BP 126/74 (BP Location: Left Arm, Patient Position: Sitting)   Pulse 70   Ht 5' 0.25" (  1.53 m)   Wt 127 lb (57.6 kg)   SpO2 98%   BMI 24.60 kg/m  , BMI Body mass index is 24.6 kg/m. GENERAL:  Sounds well NEURO:  Speech fluent PSYCH:  Cognitively intact, oriented to person place and time   ASSESSMENT & PLAN:    # Hypertension: Virginia Delgado BP has been well-controlled in the office but has been high at home.  Continue to monitor for now.  Continue amlodipine, losartan, and hydrochlorothiazide.  Could switch to Tribenzor if needed.  # Chest pain: Stress test was negative for ischemia in 2017.  However she now has chest pain that is somewhat atypical but does have an exertional component.  Given that she has known coronary calcification, recommend getting a cardiac catheterization.  She is agreeable.  We will give her nitroglycerin to take as needed.  Continue aspirin.  She is not on a statin due to intolerance to pravastatin and aversion to medications.  # Asymptomatic coronary calcification: # Hyperlipidemia:    Calcium score was 93rd percentile.  LDL remained above goal despite switching to a plant-based diet and exercising.  She tried pravastatin but did not tolerate it.  She thinks her lipids are still above goal now because she spent 46 days celebrating her birthday and traveling.  She is back to her usual routine.  We will see at her next  visit what her lipids are doing.  # Carotid calcification:   Check carotid Dopplers  # Palpitations:  3 Day Zio.    Medication Adjustments/Labs and Tests Ordered: Current medicines are reviewed at length with the patient today.  Concerns regarding medicines are outlined above.   Tests Ordered: Orders Placed This Encounter  Procedures  . TSH  . CBC with Differential/Platelet  . LONG TERM MONITOR (3-14 DAYS)  . EKG 12-Lead  . VAS US CAROTID    Medication Changes: Meds ordered this encounter  Medications  . predniSONE (DELTASONE) 50 MG tablet    Sig: TAKE 1 TABLET 13 HOURS, 7 HOURS, AND PRIOR TO LEAVING FOR PROCEDURE    Dispense:  3 tablet    Refill:  0  . nitroGLYCERIN (NITROSTAT) 0.4 MG SL tablet    Sig: Place 1 tablet (0.4 mg total) under the tongue every 5 (five) minutes as needed for chest pain.    Dispense:  25 tablet    Refill:  PRN    Follow Up:  Either In Person or Virtual in 3 month(s)  Signed, Skeet Latch, MD  04/19/2020 11:33 AM    [place]

## 2020-04-17 NOTE — H&P (View-Only) (Signed)
Cardiology Office Note   Date:  04/19/2020   ID:  Virginia Delgado, DOB Oct 12, 1945, MRN [id number]  PCP:  Glendale Chard, MD  Cardiologist:  No primary care provider on file.  Electrophysiologist:  None   Evaluation Performed:  Follow-Up Visit  Chief Complaint: Hyperlipidemia  History of Present Illness:    Virginia Delgado is a 75 y.o. female with asymptomatic coronary calcification, hypertension, hyperlipidemia and Hepatitis C s/p treatment who presents for follow up. She was initially seen 06/2015 for  management of hypertension.   She was previously treated with HCTZ and metoprolol and her BP was well-controlled until 05/2015.  Virginia Delgado was seen in the ED on 4/27.  Her BP was in the 200s/110s.  She was started on amlodipine and received ativan.  Head CT was negative for stroke.  She followed up with her PCP, Dr. Glendale Chard, who started her on Edarbi and amlodipine.  She was seen in clinic 07/06/15, at which time metoprolol was switched to carvedilol.  Renal artery Doppler was negative for stenosis. She also had a exercise Cardiolite that was negative for ischemia.  She achieved 8.5 METS. Carvedilol was stopped due to fatigue and amlodipine was increased.  She saw Dr. Baird Cancer and was switched from amlodipine to [facility] because she thought her body was immune to amlodipine.  At her last appointment Virginia Delgado was switched to HCTZ/losartan.  She followed up with our pharmacist 09/2017 and her BP was above goal so amlodipine was added back.   Virginia Delgado ad a coronary calcium score on 08/2018 that was 93rd percentile for age and gender.  She was hesitant to start statins and prefers to work on diet and exercise.   She lost 10 lb by switching to a plant based diet.   She started back exercising.  She improved her cholesterol significantly but still is not at goal so pravastatin was started 3 days/week. She noticed that she was cognitively foggy and unstable so she stopped taking it.  For the last week she has  noted daily chest pain.  It is gripping discomfort that occurs randomly laying down or when walking.  She can hear the pulsing in her head when she lays down at night.  At times her heart has been going as fast as the 100s and occurs daily.  It lasts for about a minute.  Her BP at home has been variable.  It has been in the in 130s-160s/90s.  She saw her chiropractor and her BP was OK.  They noted that she had calcification in her carotids.  She has been walking for exercise and doesn't have chest pain with that but is afraid to run.  She notes that she has been celebrating her birthday and her diet has been poor.  She thinks that this is why her lipids are worse.  She also reports continued mental fogginess.    Past Medical History:  Diagnosis Date  . Coronary artery calcification 04/05/2019   Calcium score 93rd percentile 08/2018.  Marland Kitchen Dermatitis   . Hx of hepatitis C   . Hypertension    Past Surgical History:  Procedure Laterality Date  . BUNIONECTOMY Bilateral   . CESAREAN SECTION  1976  . CESAREAN SECTION  1979  . UMBILICAL HERNIA REPAIR  1981     Current Meds  Medication Sig  . amLODipine (NORVASC) 10 MG tablet TAKE 1 TABLET BY MOUTH EVERY DAY (Patient taking differently: Take 10 mg by mouth daily.)  . aspirin 81  MG tablet Take 81 mg by mouth at bedtime.  . Cholecalciferol (VITAMIN D) 50 MCG (2000 UT) tablet Take 4,000 Units by mouth daily.  . diclofenac Sodium (VOLTAREN) 1 % GEL Apply 2-4 grams to affected joint up to 4 times daily as needed (Patient taking differently: Apply 2-4 g topically 4 (four) times daily as needed (pain). Apply 2-4 grams to affected joint up to 4 times daily as needed)  . losartan-hydrochlorothiazide (HYZAAR) 50-12.5 MG tablet TAKE 1 TABLET BY MOUTH EVERY DAY (Patient taking differently: Take 1 tablet by mouth daily.)  . nitroGLYCERIN (NITROSTAT) 0.4 MG SL tablet Place 1 tablet (0.4 mg total) under the tongue every 5 (five) minutes as needed for chest pain.  .  NONI, MORINDA CITRIFOLIA, PO Take 600 mg by mouth 2 (two) times a week.  . predniSONE (DELTASONE) 50 MG tablet TAKE 1 TABLET 13 HOURS, 7 HOURS, AND PRIOR TO LEAVING FOR PROCEDURE  . [DISCONTINUED] co-enzyme Q-10 50 MG capsule Take 50 mg by mouth daily.  . [DISCONTINUED] magnesium 30 MG tablet Take 30 mg by mouth daily.      Allergies:   Contrast media [iodinated diagnostic agents], Gadobutrol, Gadolinium derivatives, Lidocaine, Pravachol [pravastatin sodium], and Latex   Social History   Tobacco Use  . Smoking status: Never Smoker  . Smokeless tobacco: Never Used  Vaping Use  . Vaping Use: Never used  Substance Use Topics  . Alcohol use: Yes    Comment: social  . Drug use: No     Family Hx: The patient's family history includes COPD in her mother; Cancer in her mother and sister; Heart disease in her father; Hypertension in her brother and sister; Ovarian cancer in her maternal grandmother; Rheum arthritis in her paternal grandmother.  ROS:   Please see the history of present illness.    All other systems reviewed and are negative.   Prior CV studies:   The following studies were reviewed today:  Renal artery ultrasound 07/09/15: Normal renal arteries  Lexiscan Cardiolite 07/17/15:  Nuclear stress EF: 64%.  The left ventricular ejection fraction is normal (55-65%).  Blood pressure demonstrated a hypertensive response to exercise.  ST segment depression was noted during stress in the II, III, aVF, V5 and V6 leads.  The study is normal  Labs/Other Tests and Data Reviewed:    EKG:  An ECG dated 04/17/20 was personally reviewed today and demonstrated:  sinus rhythm.  Rate 66 bpm  Recent Labs: 04/13/2020: ALT 12; BUN 12; Creatinine, Ser 0.82; Potassium 3.7; Sodium 143 04/17/2020: Hemoglobin 13.2; Platelets 214; TSH 1.290   Recent Lipid Panel Lab Results  Component Value Date/Time   CHOL 219 (H) 04/13/2020 09:29 AM   TRIG 68 04/13/2020 09:29 AM   HDL 82 04/13/2020  09:29 AM   CHOLHDL 2.7 04/13/2020 09:29 AM   LDLCALC 125 (H) 04/13/2020 09:29 AM   Recent Labs: 03/21/2018: ALT 14; BUN 10; Creatinine, Ser 1.02; Potassium 3.8; Sodium 143   05/20/15: Hemoglobin A1c 5.7% Total cholesterol 238, tri 96, HDL 80, LDL 139 AST 19 ALT 14  09/06/17:  Total cholesterol 254, triglycerides 75, HDL 85, LDL 154 Hemoglobin 13.6 Creatinine 1.04 Potassium 3.9  Wt Readings from Last 3 Encounters:  04/17/20 127 lb (57.6 kg)  03/09/20 129 lb 9.6 oz (58.8 kg)  02/10/20 130 lb 12.8 oz (59.3 kg)     Objective:    VS:  BP 126/74 (BP Location: Left Arm, Patient Position: Sitting)   Pulse 70   Ht 5' 0.25" (  1.53 m)   Wt 127 lb (57.6 kg)   SpO2 98%   BMI 24.60 kg/m  , BMI Body mass index is 24.6 kg/m. GENERAL:  Sounds well NEURO:  Speech fluent PSYCH:  Cognitively intact, oriented to person place and time   ASSESSMENT & PLAN:    # Hypertension: Ms. Camposano BP has been well-controlled in the office but has been high at home.  Continue to monitor for now.  Continue amlodipine, losartan, and hydrochlorothiazide.  Could switch to Tribenzor if needed.  # Chest pain: Stress test was negative for ischemia in 2017.  However she now has chest pain that is somewhat atypical but does have an exertional component.  Given that she has known coronary calcification, recommend getting a cardiac catheterization.  She is agreeable.  We will give her nitroglycerin to take as needed.  Continue aspirin.  She is not on a statin due to intolerance to pravastatin and aversion to medications.  # Asymptomatic coronary calcification: # Hyperlipidemia:    Calcium score was 93rd percentile.  LDL remained above goal despite switching to a plant-based diet and exercising.  She tried pravastatin but did not tolerate it.  She thinks her lipids are still above goal now because she spent 46 days celebrating her birthday and traveling.  She is back to her usual routine.  We will see at her next  visit what her lipids are doing.  # Carotid calcification:   Check carotid Dopplers  # Palpitations:  3 Day Zio.    Medication Adjustments/Labs and Tests Ordered: Current medicines are reviewed at length with the patient today.  Concerns regarding medicines are outlined above.   Tests Ordered: Orders Placed This Encounter  Procedures  . TSH  . CBC with Differential/Platelet  . LONG TERM MONITOR (3-14 DAYS)  . EKG 12-Lead  . VAS US CAROTID    Medication Changes: Meds ordered this encounter  Medications  . predniSONE (DELTASONE) 50 MG tablet    Sig: TAKE 1 TABLET 13 HOURS, 7 HOURS, AND PRIOR TO LEAVING FOR PROCEDURE    Dispense:  3 tablet    Refill:  0  . nitroGLYCERIN (NITROSTAT) 0.4 MG SL tablet    Sig: Place 1 tablet (0.4 mg total) under the tongue every 5 (five) minutes as needed for chest pain.    Dispense:  25 tablet    Refill:  PRN    Follow Up:  Either In Person or Virtual in 3 month(s)  Signed, Skeet Latch, MD  04/19/2020 11:33 AM    [place]

## 2020-04-17 NOTE — Progress Notes (Signed)
Enrolled patient for a 3 day Zio XT Monitor to be mailed to patients home.  °

## 2020-04-18 LAB — SARS CORONAVIRUS 2 (TAT 6-24 HRS): SARS Coronavirus 2: NEGATIVE

## 2020-04-19 ENCOUNTER — Encounter: Payer: Self-pay | Admitting: Cardiovascular Disease

## 2020-04-20 ENCOUNTER — Inpatient Hospital Stay (HOSPITAL_COMMUNITY)
Admission: RE | Admit: 2020-04-20 | Discharge: 2020-04-27 | DRG: 234 | Disposition: A | Discharge status: Home / Self Care | Payer: Medicare HMO | Attending: Cardiothoracic Surgery | Admitting: Cardiothoracic Surgery

## 2020-04-20 ENCOUNTER — Encounter (HOSPITAL_COMMUNITY): Payer: Self-pay | Admitting: Internal Medicine

## 2020-04-20 ENCOUNTER — Encounter (HOSPITAL_COMMUNITY)
Admission: RE | Disposition: A | Discharge status: Home / Self Care | Payer: Medicare HMO | Source: Home / Self Care | Attending: Cardiothoracic Surgery

## 2020-04-20 ENCOUNTER — Inpatient Hospital Stay (HOSPITAL_COMMUNITY): Payer: Medicare HMO

## 2020-04-20 ENCOUNTER — Other Ambulatory Visit: Payer: Self-pay

## 2020-04-20 DIAGNOSIS — D62 Acute posthemorrhagic anemia: Secondary | ICD-10-CM | POA: Diagnosis not present

## 2020-04-20 DIAGNOSIS — Z7982 Long term (current) use of aspirin: Secondary | ICD-10-CM

## 2020-04-20 DIAGNOSIS — E7800 Pure hypercholesterolemia, unspecified: Secondary | ICD-10-CM | POA: Diagnosis not present

## 2020-04-20 DIAGNOSIS — I25110 Atherosclerotic heart disease of native coronary artery with unstable angina pectoris: Principal | ICD-10-CM | POA: Diagnosis present

## 2020-04-20 DIAGNOSIS — I129 Hypertensive chronic kidney disease with stage 1 through stage 4 chronic kidney disease, or unspecified chronic kidney disease: Secondary | ICD-10-CM | POA: Diagnosis present

## 2020-04-20 DIAGNOSIS — I088 Other rheumatic multiple valve diseases: Secondary | ICD-10-CM | POA: Diagnosis not present

## 2020-04-20 DIAGNOSIS — Z20822 Contact with and (suspected) exposure to covid-19: Secondary | ICD-10-CM | POA: Diagnosis present

## 2020-04-20 DIAGNOSIS — I2510 Atherosclerotic heart disease of native coronary artery without angina pectoris: Secondary | ICD-10-CM | POA: Diagnosis not present

## 2020-04-20 DIAGNOSIS — I200 Unstable angina: Secondary | ICD-10-CM | POA: Diagnosis present

## 2020-04-20 DIAGNOSIS — Z79899 Other long term (current) drug therapy: Secondary | ICD-10-CM | POA: Diagnosis not present

## 2020-04-20 DIAGNOSIS — R079 Chest pain, unspecified: Secondary | ICD-10-CM

## 2020-04-20 DIAGNOSIS — Z888 Allergy status to other drugs, medicaments and biological substances status: Secondary | ICD-10-CM

## 2020-04-20 DIAGNOSIS — Z91041 Radiographic dye allergy status: Secondary | ICD-10-CM | POA: Diagnosis not present

## 2020-04-20 DIAGNOSIS — B1920 Unspecified viral hepatitis C without hepatic coma: Secondary | ICD-10-CM | POA: Diagnosis present

## 2020-04-20 DIAGNOSIS — I480 Paroxysmal atrial fibrillation: Secondary | ICD-10-CM | POA: Diagnosis not present

## 2020-04-20 DIAGNOSIS — I10 Essential (primary) hypertension: Secondary | ICD-10-CM | POA: Diagnosis not present

## 2020-04-20 DIAGNOSIS — I97791 Other intraoperative cardiac functional disturbances during other surgery: Secondary | ICD-10-CM | POA: Diagnosis not present

## 2020-04-20 DIAGNOSIS — I493 Ventricular premature depolarization: Secondary | ICD-10-CM | POA: Diagnosis not present

## 2020-04-20 DIAGNOSIS — I4891 Unspecified atrial fibrillation: Secondary | ICD-10-CM | POA: Diagnosis not present

## 2020-04-20 DIAGNOSIS — E8770 Fluid overload, unspecified: Secondary | ICD-10-CM | POA: Diagnosis not present

## 2020-04-20 DIAGNOSIS — Z951 Presence of aortocoronary bypass graft: Secondary | ICD-10-CM

## 2020-04-20 DIAGNOSIS — I517 Cardiomegaly: Secondary | ICD-10-CM | POA: Diagnosis not present

## 2020-04-20 DIAGNOSIS — J9811 Atelectasis: Secondary | ICD-10-CM | POA: Diagnosis not present

## 2020-04-20 DIAGNOSIS — Z91040 Latex allergy status: Secondary | ICD-10-CM

## 2020-04-20 DIAGNOSIS — R69 Illness, unspecified: Secondary | ICD-10-CM | POA: Diagnosis not present

## 2020-04-20 DIAGNOSIS — Z01810 Encounter for preprocedural cardiovascular examination: Secondary | ICD-10-CM

## 2020-04-20 DIAGNOSIS — N182 Chronic kidney disease, stage 2 (mild): Secondary | ICD-10-CM | POA: Diagnosis not present

## 2020-04-20 DIAGNOSIS — J90 Pleural effusion, not elsewhere classified: Secondary | ICD-10-CM | POA: Diagnosis not present

## 2020-04-20 DIAGNOSIS — Z01818 Encounter for other preprocedural examination: Secondary | ICD-10-CM | POA: Diagnosis not present

## 2020-04-20 DIAGNOSIS — Z9889 Other specified postprocedural states: Secondary | ICD-10-CM

## 2020-04-20 HISTORY — PX: LEFT HEART CATH AND CORONARY ANGIOGRAPHY: CATH118249

## 2020-04-20 LAB — ECHOCARDIOGRAM COMPLETE
Area-P 1/2: 3.65 cm2
Height: 60.25 in
P 1/2 time: 513 msec
S' Lateral: 2.4 cm
Weight: 2032 oz

## 2020-04-20 SURGERY — LEFT HEART CATH AND CORONARY ANGIOGRAPHY
Anesthesia: LOCAL

## 2020-04-20 MED ORDER — LOSARTAN POTASSIUM-HCTZ 50-12.5 MG PO TABS: 1.0000 | ORAL_TABLET | Freq: Every day | ORAL | Status: DC

## 2020-04-20 MED ORDER — LABETALOL HCL 5 MG/ML IV SOLN: 10.0000 mg | INTRAVENOUS | Status: AC | PRN

## 2020-04-20 MED ORDER — HEPARIN (PORCINE) IN NACL 1000-0.9 UT/500ML-% IV SOLN
INTRAVENOUS | Status: AC
  Filled 2020-04-20: qty 500

## 2020-04-20 MED ORDER — SODIUM CHLORIDE 0.9% FLUSH: 3.0000 mL | Freq: Two times a day (BID) | INTRAVENOUS | Status: DC

## 2020-04-20 MED ORDER — HYDROCHLOROTHIAZIDE 12.5 MG PO CAPS
12.5000 mg | ORAL_CAPSULE | Freq: Every day | ORAL | Status: DC
Start: 2020-04-21 — End: 2020-04-22
  Administered 2020-04-21: 12.5 mg via ORAL
  Filled 2020-04-20: qty 1

## 2020-04-20 MED ORDER — MIDAZOLAM HCL 2 MG/2ML IJ SOLN
INTRAMUSCULAR | Status: AC
  Filled 2020-04-20: qty 2

## 2020-04-20 MED ORDER — HYDRALAZINE HCL 20 MG/ML IJ SOLN: 10.0000 mg | INTRAMUSCULAR | Status: AC | PRN

## 2020-04-20 MED ORDER — FENTANYL CITRATE (PF) 100 MCG/2ML IJ SOLN
INTRAMUSCULAR | Status: DC | PRN
  Administered 2020-04-20: 25 ug via INTRAVENOUS

## 2020-04-20 MED ORDER — SODIUM CHLORIDE 0.9 % WEIGHT BASED INFUSION: 1.0000 mL/kg/h | INTRAVENOUS | Status: DC

## 2020-04-20 MED ORDER — BUPIVACAINE HCL (PF) 0.25 % IJ SOLN
INTRAMUSCULAR | Status: DC | PRN
  Administered 2020-04-20: 1 mL

## 2020-04-20 MED ORDER — BUPIVACAINE HCL (PF) 0.25 % IJ SOLN
INTRAMUSCULAR | Status: AC
  Filled 2020-04-20: qty 30

## 2020-04-20 MED ORDER — HEPARIN SODIUM (PORCINE) 1000 UNIT/ML IJ SOLN
INTRAMUSCULAR | Status: DC | PRN
  Administered 2020-04-20: 3000 [IU] via INTRAVENOUS

## 2020-04-20 MED ORDER — SODIUM CHLORIDE 0.9 % IV SOLN: 250.0000 mL | INTRAVENOUS | Status: DC | PRN

## 2020-04-20 MED ORDER — AMLODIPINE BESYLATE 10 MG PO TABS
10.0000 mg | ORAL_TABLET | Freq: Every day | ORAL | Status: DC
  Administered 2020-04-21: 10 mg via ORAL
  Filled 2020-04-20: qty 1

## 2020-04-20 MED ORDER — SODIUM CHLORIDE 0.9% FLUSH: 3.0000 mL | INTRAVENOUS | Status: DC | PRN

## 2020-04-20 MED ORDER — ASPIRIN 81 MG PO CHEW
81.0000 mg | CHEWABLE_TABLET | ORAL | Status: AC
  Administered 2020-04-20: 81 mg via ORAL
  Filled 2020-04-20: qty 1

## 2020-04-20 MED ORDER — LOSARTAN POTASSIUM 50 MG PO TABS
50.0000 mg | ORAL_TABLET | Freq: Every day | ORAL | Status: DC
  Administered 2020-04-21: 50 mg via ORAL
  Filled 2020-04-20: qty 1

## 2020-04-20 MED ORDER — ACETAMINOPHEN 325 MG PO TABS
650.0000 mg | ORAL_TABLET | ORAL | Status: DC | PRN
  Administered 2020-04-21: 650 mg via ORAL
  Filled 2020-04-20: qty 2

## 2020-04-20 MED ORDER — SODIUM CHLORIDE 0.9 % WEIGHT BASED INFUSION
3.0000 mL/kg/h | INTRAVENOUS | Status: DC
  Administered 2020-04-20: 3 mL/kg/h via INTRAVENOUS

## 2020-04-20 MED ORDER — HEPARIN SODIUM (PORCINE) 1000 UNIT/ML IJ SOLN
INTRAMUSCULAR | Status: AC
  Filled 2020-04-20: qty 1

## 2020-04-20 MED ORDER — MIDAZOLAM HCL 2 MG/2ML IJ SOLN
INTRAMUSCULAR | Status: DC | PRN
  Administered 2020-04-20: 1 mg via INTRAVENOUS

## 2020-04-20 MED ORDER — HEPARIN (PORCINE) IN NACL 1000-0.9 UT/500ML-% IV SOLN
INTRAVENOUS | Status: DC | PRN
  Administered 2020-04-20 (×2): 500 mL

## 2020-04-20 MED ORDER — VITAMIN D 25 MCG (1000 UNIT) PO TABS
4000.0000 [IU] | ORAL_TABLET | Freq: Every day | ORAL | Status: DC
  Administered 2020-04-21: 4000 [IU] via ORAL
  Filled 2020-04-20: qty 4

## 2020-04-20 MED ORDER — FENTANYL CITRATE (PF) 100 MCG/2ML IJ SOLN
INTRAMUSCULAR | Status: AC
  Filled 2020-04-20: qty 2

## 2020-04-20 MED ORDER — ENOXAPARIN SODIUM 40 MG/0.4ML ~~LOC~~ SOLN
40.0000 mg | SUBCUTANEOUS | Status: DC
  Administered 2020-04-21: 40 mg via SUBCUTANEOUS
  Filled 2020-04-20: qty 0.4

## 2020-04-20 MED ORDER — VERAPAMIL HCL 2.5 MG/ML IV SOLN
INTRAVENOUS | Status: DC | PRN
  Administered 2020-04-20: 10 mL via INTRA_ARTERIAL

## 2020-04-20 MED ORDER — ONDANSETRON HCL 4 MG/2ML IJ SOLN: 4.0000 mg | Freq: Four times a day (QID) | INTRAMUSCULAR | Status: DC | PRN

## 2020-04-20 MED ORDER — NITROGLYCERIN 0.4 MG SL SUBL: 0.4000 mg | SUBLINGUAL_TABLET | SUBLINGUAL | Status: DC | PRN

## 2020-04-20 MED ORDER — IOHEXOL 350 MG/ML SOLN
INTRAVENOUS | Status: DC | PRN
  Administered 2020-04-20: 35 mL

## 2020-04-20 MED ORDER — VERAPAMIL HCL 2.5 MG/ML IV SOLN
INTRAVENOUS | Status: AC
  Filled 2020-04-20: qty 2

## 2020-04-20 MED ORDER — SODIUM CHLORIDE 0.9% FLUSH
3.0000 mL | Freq: Two times a day (BID) | INTRAVENOUS | Status: DC
  Administered 2020-04-20 – 2020-04-21 (×3): 3 mL via INTRAVENOUS

## 2020-04-20 MED ORDER — ASPIRIN EC 81 MG PO TBEC
81.0000 mg | DELAYED_RELEASE_TABLET | Freq: Every day | ORAL | Status: DC
  Administered 2020-04-21: 81 mg via ORAL
  Filled 2020-04-20: qty 1

## 2020-04-20 SURGICAL SUPPLY — 10 items
CATH 5FR JL3.5 JR4 ANG PIG MP (CATHETERS) ×2 IMPLANT
DEVICE RAD COMP TR BAND LRG (VASCULAR PRODUCTS) ×2 IMPLANT
GLIDESHEATH SLEND SS 6F .021 (SHEATH) ×2 IMPLANT
GUIDEWIRE INQWIRE 1.5J.035X260 (WIRE) ×1 IMPLANT
INQWIRE 1.5J .035X260CM (WIRE) ×2
KIT HEART LEFT (KITS) ×2 IMPLANT
PACK CARDIAC CATHETERIZATION (CUSTOM PROCEDURE TRAY) ×2 IMPLANT
TRANSDUCER W/STOPCOCK (MISCELLANEOUS) ×2 IMPLANT
TUBING CIL FLEX 10 FLL-RA (TUBING) ×2 IMPLANT
WIRE HI TORQ VERSACORE-J 145CM (WIRE) ×2 IMPLANT

## 2020-04-20 NOTE — Interval H&P Note (Signed)
History and Physical Interval Note:  04/20/2020 11:12 AM  Virginia Delgado  has presented today for surgery, with the diagnosis of chest pain.  The various methods of treatment have been discussed with the patient and family. After consideration of risks, benefits and other options for treatment, the patient has consented to  Procedure(s): LEFT HEART CATH AND CORONARY ANGIOGRAPHY (N/A) as a surgical intervention.  The patient's history has been reviewed, patient examined, no change in status, stable for surgery.  I have reviewed the patient's chart and labs.  Questions were answered to the patient's satisfaction.    Cath Lab Visit (complete for each Cath Lab visit)  Clinical Evaluation Leading to the Procedure:   ACS: No.  Non-ACS:    Anginal Classification: CCS IV  Anti-ischemic medical therapy: Minimal Therapy (1 class of medications)  Non-Invasive Test Results: No non-invasive testing performed  Prior CABG: No previous CABG  Christopher End

## 2020-04-20 NOTE — Progress Notes (Signed)
  Echocardiogram 2D Echocardiogram has been performed.  Virginia Delgado 04/20/2020, 4:53 PM

## 2020-04-20 NOTE — Progress Notes (Signed)
TR BAND REMOVAL  LOCATION:   Right radial  DEFLATED PER PROTOCOL:    Yes.    TIME BAND OFF / DRESSING APPLIED:    1440p A clean dry dressing applied with gauze and tegaderm  SITE UPON ARRIVAL:    Level 0  SITE AFTER BAND REMOVAL:    Level 0  CIRCULATION SENSATION AND MOVEMENT:    Within Normal Limits   Yes.    COMMENTS:   Care instructions given to patient

## 2020-04-20 NOTE — Progress Notes (Signed)
Took prednisone at 8pm last night, this morning at 0100 and this morning at 0800; Benadryl 50mg  at 0820am

## 2020-04-21 ENCOUNTER — Inpatient Hospital Stay (HOSPITAL_COMMUNITY): Payer: Medicare HMO

## 2020-04-21 ENCOUNTER — Encounter (HOSPITAL_COMMUNITY): Payer: Self-pay | Admitting: Internal Medicine

## 2020-04-21 ENCOUNTER — Encounter: Payer: Self-pay | Admitting: Internal Medicine

## 2020-04-21 DIAGNOSIS — I200 Unstable angina: Secondary | ICD-10-CM | POA: Diagnosis not present

## 2020-04-21 DIAGNOSIS — Z01810 Encounter for preprocedural cardiovascular examination: Secondary | ICD-10-CM

## 2020-04-21 DIAGNOSIS — R079 Chest pain, unspecified: Secondary | ICD-10-CM

## 2020-04-21 DIAGNOSIS — E7800 Pure hypercholesterolemia, unspecified: Secondary | ICD-10-CM

## 2020-04-21 LAB — BLOOD GAS, ARTERIAL
Acid-Base Excess: 1.3 mmol/L (ref 0.0–2.0)
Bicarbonate: 25.1 mmol/L (ref 20.0–28.0)
Drawn by: 519031
FIO2: 21
O2 Saturation: 96.4 %
Patient temperature: 36.5
pCO2 arterial: 37.3 mmHg (ref 32.0–48.0)
pH, Arterial: 7.441 (ref 7.350–7.450)
pO2, Arterial: 79.4 mmHg — ABNORMAL LOW (ref 83.0–108.0)

## 2020-04-21 LAB — CBC
HCT: 36.6 % (ref 36.0–46.0)
Hemoglobin: 12.3 g/dL (ref 12.0–15.0)
MCH: 29.6 pg (ref 26.0–34.0)
MCHC: 33.6 g/dL (ref 30.0–36.0)
MCV: 88.2 fL (ref 80.0–100.0)
Platelets: 199 10*3/uL (ref 150–400)
RBC: 4.15 MIL/uL (ref 3.87–5.11)
RDW: 12.3 % (ref 11.5–15.5)
WBC: 10.3 10*3/uL (ref 4.0–10.5)
nRBC: 0 % (ref 0.0–0.2)

## 2020-04-21 LAB — URINALYSIS, ROUTINE W REFLEX MICROSCOPIC
Bilirubin Urine: NEGATIVE
Glucose, UA: NEGATIVE mg/dL
Hgb urine dipstick: NEGATIVE
Ketones, ur: NEGATIVE mg/dL
Leukocytes,Ua: NEGATIVE
Nitrite: NEGATIVE
Protein, ur: NEGATIVE mg/dL
Specific Gravity, Urine: 1.013 (ref 1.005–1.030)
pH: 7 (ref 5.0–8.0)

## 2020-04-21 LAB — BASIC METABOLIC PANEL
Anion gap: 7 (ref 5–15)
BUN: 13 mg/dL (ref 8–23)
CO2: 25 mmol/L (ref 22–32)
Calcium: 10.4 mg/dL — ABNORMAL HIGH (ref 8.9–10.3)
Chloride: 107 mmol/L (ref 98–111)
Creatinine, Ser: 0.83 mg/dL (ref 0.44–1.00)
GFR, Estimated: >60 mL/min (ref >60)
Glucose, Bld: 107 mg/dL — ABNORMAL HIGH (ref 70–99)
Potassium: 3.4 mmol/L — ABNORMAL LOW (ref 3.5–5.1)
Sodium: 139 mmol/L (ref 135–145)

## 2020-04-21 LAB — HEMOGLOBIN A1C
Hgb A1c MFr Bld: 5.5 % (ref 4.8–5.6)
Mean Plasma Glucose: 111.15 mg/dL

## 2020-04-21 LAB — PROTIME-INR
INR: 1 (ref 0.8–1.2)
Prothrombin Time: 13.1 s (ref 11.4–15.2)

## 2020-04-21 LAB — MRSA PCR SCREENING: MRSA by PCR: NEGATIVE

## 2020-04-21 LAB — ABO/RH: ABO/RH(D): O POS

## 2020-04-21 LAB — APTT: aPTT: 30 seconds (ref 24–36)

## 2020-04-21 MED ORDER — TRANEXAMIC ACID 1000 MG/10ML IV SOLN
1.5000 mg/kg/h | INTRAVENOUS | Status: AC
  Administered 2020-04-22: 1.5 mg/kg/h via INTRAVENOUS
  Filled 2020-04-21: qty 25

## 2020-04-21 MED ORDER — PLASMA-LYTE 148 IV SOLN
INTRAVENOUS | Status: DC
  Filled 2020-04-21: qty 2.5

## 2020-04-21 MED ORDER — TRANEXAMIC ACID (OHS) PUMP PRIME SOLUTION
2.0000 mg/kg | INTRAVENOUS | Status: DC
  Filled 2020-04-21: qty 1.2

## 2020-04-21 MED ORDER — POTASSIUM CHLORIDE 2 MEQ/ML IV SOLN
80.0000 meq | INTRAVENOUS | Status: DC
  Filled 2020-04-21: qty 40

## 2020-04-21 MED ORDER — TEMAZEPAM 15 MG PO CAPS: 15.0000 mg | ORAL_CAPSULE | Freq: Once | ORAL | Status: DC | PRN

## 2020-04-21 MED ORDER — CHLORHEXIDINE GLUCONATE CLOTH 2 % EX PADS
6.0000 | MEDICATED_PAD | Freq: Once | CUTANEOUS | Status: AC
  Administered 2020-04-21: 6 via TOPICAL

## 2020-04-21 MED ORDER — NITROGLYCERIN IN D5W 200-5 MCG/ML-% IV SOLN
2.0000 ug/min | INTRAVENOUS | Status: AC
  Administered 2020-04-22: 5 ug/min via INTRAVENOUS
  Filled 2020-04-21: qty 250

## 2020-04-21 MED ORDER — INSULIN REGULAR(HUMAN) IN NACL 100-0.9 UT/100ML-% IV SOLN
INTRAVENOUS | Status: AC
  Administered 2020-04-22: .6 [IU]/h via INTRAVENOUS
  Filled 2020-04-21: qty 100

## 2020-04-21 MED ORDER — PHENYLEPHRINE HCL-NACL 20-0.9 MG/250ML-% IV SOLN
30.0000 ug/min | INTRAVENOUS | Status: AC
  Administered 2020-04-22: 20 ug/min via INTRAVENOUS
  Filled 2020-04-21: qty 250

## 2020-04-21 MED ORDER — MAGNESIUM SULFATE 50 % IJ SOLN
40.0000 meq | INTRAMUSCULAR | Status: DC
  Filled 2020-04-21: qty 9.85

## 2020-04-21 MED ORDER — CHLORHEXIDINE GLUCONATE 0.12 % MT SOLN
15.0000 mL | Freq: Once | OROMUCOSAL | Status: AC
  Administered 2020-04-22: 15 mL via OROMUCOSAL
  Filled 2020-04-21: qty 15

## 2020-04-21 MED ORDER — NOREPINEPHRINE 4 MG/250ML-% IV SOLN
0.0000 ug/min | INTRAVENOUS | Status: DC
  Filled 2020-04-21: qty 250

## 2020-04-21 MED ORDER — TRANEXAMIC ACID (OHS) BOLUS VIA INFUSION
15.0000 mg/kg | INTRAVENOUS | Status: AC
  Administered 2020-04-22: 903 mg via INTRAVENOUS
  Filled 2020-04-21: qty 903

## 2020-04-21 MED ORDER — VANCOMYCIN HCL 1250 MG/250ML IV SOLN
1250.0000 mg | INTRAVENOUS | Status: AC
  Administered 2020-04-22: 1250 mg via INTRAVENOUS
  Filled 2020-04-21: qty 250

## 2020-04-21 MED ORDER — SODIUM CHLORIDE 0.9 % IV SOLN
1.5000 g | INTRAVENOUS | Status: AC
  Administered 2020-04-22: 1.5 g via INTRAVENOUS
  Filled 2020-04-21: qty 1.5

## 2020-04-21 MED ORDER — METOPROLOL TARTRATE 12.5 MG HALF TABLET
12.5000 mg | ORAL_TABLET | Freq: Once | ORAL | Status: AC
  Administered 2020-04-22: 12.5 mg via ORAL
  Filled 2020-04-21: qty 1

## 2020-04-21 MED ORDER — EPINEPHRINE HCL 5 MG/250ML IV SOLN IN NS
0.0000 ug/min | INTRAVENOUS | Status: DC
  Filled 2020-04-21: qty 250

## 2020-04-21 MED ORDER — CARVEDILOL 3.125 MG PO TABS
3.1250 mg | ORAL_TABLET | Freq: Two times a day (BID) | ORAL | Status: DC
Start: 2020-04-21 — End: 2020-04-22
  Administered 2020-04-21: 3.125 mg via ORAL
  Filled 2020-04-21: qty 1

## 2020-04-21 MED ORDER — BISACODYL 5 MG PO TBEC
5.0000 mg | DELAYED_RELEASE_TABLET | Freq: Once | ORAL | Status: AC
  Administered 2020-04-21: 5 mg via ORAL
  Filled 2020-04-21: qty 1

## 2020-04-21 MED ORDER — MILRINONE LACTATE IN DEXTROSE 20-5 MG/100ML-% IV SOLN
0.3000 ug/kg/min | INTRAVENOUS | Status: DC
  Filled 2020-04-21: qty 100

## 2020-04-21 MED ORDER — SODIUM CHLORIDE 0.9 % IV SOLN
INTRAVENOUS | Status: DC
  Filled 2020-04-21: qty 30

## 2020-04-21 MED ORDER — SODIUM CHLORIDE 0.9 % IV SOLN
750.0000 mg | INTRAVENOUS | Status: AC
  Administered 2020-04-22: 750 mg via INTRAVENOUS
  Filled 2020-04-21: qty 750

## 2020-04-21 MED ORDER — DEXMEDETOMIDINE HCL IN NACL 400 MCG/100ML IV SOLN
0.1000 ug/kg/h | INTRAVENOUS | Status: AC
  Administered 2020-04-22: .3 ug/kg/h via INTRAVENOUS
  Filled 2020-04-21: qty 100

## 2020-04-21 MED ORDER — CHLORHEXIDINE GLUCONATE CLOTH 2 % EX PADS
6.0000 | MEDICATED_PAD | Freq: Once | CUTANEOUS | Status: AC
  Administered 2020-04-22: 6 via TOPICAL

## 2020-04-21 NOTE — Progress Notes (Addendum)
Progress Note  Patient Name: Virginia Delgado Date of Encounter: 04/21/2020  Primary Cardiologist: Dr. Oval Linsey, MD   Subjective   No chest pain. Anticipate TCTS for CABG evaluation.   Inpatient Medications    Scheduled Meds: . amLODipine  10 mg Oral Daily  . aspirin EC  81 mg Oral QHS  . cholecalciferol  4,000 Units Oral Daily  . enoxaparin (LOVENOX) injection  40 mg Subcutaneous Q24H  . losartan  50 mg Oral Daily   And  . hydrochlorothiazide  12.5 mg Oral Daily  . sodium chloride flush  3 mL Intravenous Q12H   Continuous Infusions: . sodium chloride     PRN Meds: sodium chloride, acetaminophen, nitroGLYCERIN, ondansetron (ZOFRAN) IV, sodium chloride flush   Vital Signs    Vitals:   04/20/20 2030 04/21/20 0002 04/21/20 0504 04/21/20 0700  BP: 122/61 116/70 121/60 126/75  Pulse: 79 84 80 63  Resp: 18 18 18 16   Temp: 98.2 F (36.8 C) 97.7 F (36.5 C) 97.7 F (36.5 C)   TempSrc: Oral Oral Oral   SpO2:    100%  Weight:   60.2 kg   Height:       No intake or output data in the 24 hours ending 04/21/20 0947 Filed Weights   04/20/20 0926 04/21/20 0504  Weight: 57.6 kg 60.2 kg    Physical Exam   General: Well developed, well nourished, NAD Neck: Negative for carotid bruits. No JVD Lungs:Clear to ausculation bilaterally. Breathing is unlabored. Cardiovascular: RRR with S1 S2. No murmurs, rubs, gallops, or LV heave appreciated. Abdomen: Soft, non-tender, non-distended. No obvious abdominal masses. Extremities: No edema. Radial pulses 2+ bilaterally Neuro: Alert and oriented. No focal deficits. No facial asymmetry. MAE spontaneously. Psych: Responds to questions appropriately with normal affect.    Labs    Chemistry Recent Labs  Lab 04/21/20 0118  NA 139  K 3.4*  CL 107  CO2 25  GLUCOSE 107*  BUN 13  CREATININE 0.83  CALCIUM 10.4*  GFRNONAA >60  ANIONGAP 7     Hematology Recent Labs  Lab 04/17/20 0907 04/21/20 0118  WBC 5.9 10.3  RBC 4.39  4.15  HGB 13.2 12.3  HCT 39.1 36.6  MCV 89 88.2  MCH 30.1 29.6  MCHC 33.8 33.6  RDW 11.7 12.3  PLT 214 199    Cardiac EnzymesNo results for input(s): TROPONINI in the last 168 hours. No results for input(s): TROPIPOC in the last 168 hours.   BNPNo results for input(s): BNP, PROBNP in the last 168 hours.   DDimer No results for input(s): DDIMER in the last 168 hours.   Radiology    CARDIAC CATHETERIZATION  Addendum Date: 04/20/2020   Conclusions: 1. Severe three-vessel coronary artery disease, including 80-90% ostial/proximal LAD stenosis, 70-90% lesions of ostial/proximal D1, D2, and ramus intermedius, and diffuse mid/distal LCx disease of up to 80% (dominant LCx).  90% stenosis of nondominant RCA is also present. 2. Normal left ventricular contraction with mildly elevated filling pressure. Recommendations: 1. Given significant three-vessel disease, including ostial LAD involvement, recommend cardiac surgery consultation for CABG.  Given rapid progression of symptoms (often present at rest), patient will be admitted for inpatient surgical evaluation. 2. Aggressive secondary prevention, including addition of statin. 3. Obtain echocardiogram. Nelva Bush, MD [facility]   Result Date: 04/20/2020 Conclusions: 1. Severe three-vessel coronary artery disease, including 80-90% ostial/proximal LAD stenosis, 70-90% lesions of ostial/proximal D1, D2, and ramus intermedius, and diffuse mid/distal LCx disease of up to  80% (dominant LCx).  90% stenosis of nondominant RCA is also present. 2. Normal left ventricular contraction with mildly elevated filling pressure. Recommendations: 1. Given significant three-vessel disease, including ostial LAD involvement, recommend cardiac surgery consultation for CABG.  Given rapid progression of symptoms (often present at rest), patient will be admitted for inpatient surgical evaluation. 2. Aggressive secondary prevention, including addition of statin. 3. Obtain  echocardiogram. Nelva Bush, MD [facility]   ECHOCARDIOGRAM COMPLETE  Result Date: 04/20/2020    ECHOCARDIOGRAM REPORT   Patient Name:   Virginia Delgado Date of Exam: 04/20/2020 Medical Rec #:  [id number]  Height:       60.2 in Accession #:    [id number] Weight:       127.0 lb Date of Birth:  May 19, 1945 BSA:          1.543 m Patient Age:    75 years   BP:           133/76 mmHg Patient Gender: F          HR:           70 bpm. Exam Location:  Inpatient Procedure: 2D Echo Indications:    chest pain  History:        Patient has prior history of Echocardiogram examinations, most                 recent 09/21/2004.  Sonographer:    Johny Chess Referring Phys: Marceline  1. Left ventricular ejection fraction, by estimation, is 65 to 70%. The left ventricle has normal function. The left ventricle has no regional wall motion abnormalities. Left ventricular diastolic parameters are indeterminate.  2. Right ventricular systolic function is normal. The right ventricular size is normal. There is normal pulmonary artery systolic pressure.  3. The mitral valve is normal in structure. Mild mitral valve regurgitation.  4. The aortic valve is tricuspid. Aortic valve regurgitation is mild. Mild to moderate aortic valve sclerosis/calcification is present, without any evidence of aortic stenosis.  5. The inferior vena cava is normal in size with greater than 50% respiratory variability, suggesting right atrial pressure of 3 mmHg. FINDINGS  Left Ventricle: Left ventricular ejection fraction, by estimation, is 65 to 70%. The left ventricle has normal function. The left ventricle has no regional wall motion abnormalities. The left ventricular internal cavity size was normal in size. There is  no left ventricular hypertrophy. Left ventricular diastolic parameters are indeterminate. Right Ventricle: The right ventricular size is normal. Right vetricular wall thickness was not assessed. Right ventricular  systolic function is normal. There is normal pulmonary artery systolic pressure. The tricuspid regurgitant velocity is 1.95 m/s, and with an assumed right atrial pressure of 3 mmHg, the estimated right ventricular systolic pressure is 24.5 mmHg. Left Atrium: Left atrial size was normal in size. Right Atrium: Right atrial size was normal in size. Pericardium: There is no evidence of pericardial effusion. Mitral Valve: The mitral valve is normal in structure. Mild mitral valve regurgitation. Tricuspid Valve: The tricuspid valve is normal in structure. Tricuspid valve regurgitation is trivial. Aortic Valve: The aortic valve is tricuspid. Aortic valve regurgitation is mild. Aortic regurgitation PHT measures 513 msec. Mild to moderate aortic valve sclerosis/calcification is present, without any evidence of aortic stenosis. Pulmonic Valve: The pulmonic valve was normal in structure. Pulmonic valve regurgitation is trivial. Aorta: The aortic root and ascending aorta are structurally normal, with no evidence of dilitation. Venous: The inferior vena cava is normal in size with  greater than 50% respiratory variability, suggesting right atrial pressure of 3 mmHg. IAS/Shunts: No atrial level shunt detected by color flow Doppler.  LEFT VENTRICLE PLAX 2D LVIDd:         4.00 cm  Diastology LVIDs:         2.40 cm  LV e' medial:    7.18 cm/s LV PW:         0.80 cm  LV E/e' medial:  10.6 LV IVS:        0.80 cm  LV e' lateral:   9.90 cm/s LVOT diam:     1.60 cm  LV E/e' lateral: 7.7 LV SV:         44 LV SV Index:   28 LVOT Area:     2.01 cm  RIGHT VENTRICLE             IVC RV S prime:     16.60 cm/s  IVC diam: 1.30 cm TAPSE (M-mode): 2.5 cm LEFT ATRIUM             Index       RIGHT ATRIUM           Index LA diam:        3.50 cm 2.27 cm/m  RA Area:     10.40 cm LA Vol (A2C):   39.6 ml 25.66 ml/m RA Volume:   20.30 ml  13.15 ml/m LA Vol (A4C):   36.8 ml 23.84 ml/m LA Biplane Vol: 39.3 ml 25.46 ml/m  AORTIC VALVE LVOT Vmax:    110.00 cm/s LVOT Vmean:  67.400 cm/s LVOT VTI:    0.218 m AI PHT:      513 msec  AORTA Ao Root diam: 2.40 cm Ao Asc diam:  2.70 cm MITRAL VALVE               TRICUSPID VALVE MV Area (PHT): 3.65 cm    TR Peak grad:   15.2 mmHg MV Decel Time: 208 msec    TR Vmax:        195.00 cm/s MV E velocity: 75.80 cm/s MV A velocity: 86.50 cm/s  SHUNTS MV E/A ratio:  0.88        Systemic VTI:  0.22 m                            Systemic Diam: 1.60 cm Dorris Carnes MD Electronically signed by Dorris Carnes MD Signature Date/Time: 04/20/2020/7:43:20 PM    Final    VAS US DOPPLER PRE CABG  Result Date: 04/21/2020 PREOPERATIVE VASCULAR EVALUATION  Indications:      Pre-CABG. Risk Factors:     Hypertension, coronary artery disease. Comparison Study: no prior Performing Technologist: Abram Sander RVS  Examination Guidelines: A complete evaluation includes B-mode imaging, spectral Doppler, color Doppler, and power Doppler as needed of all accessible portions of each vessel. Bilateral testing is considered an integral part of a complete examination. Limited examinations for reoccurring indications may be performed as noted.  Right Carotid Findings: +----------+--------+--------+--------+------------+--------+           PSV cm/sEDV cm/sStenosisDescribe    Comments +----------+--------+--------+--------+------------+--------+ CCA Prox  78      10              heterogenous         +----------+--------+--------+--------+------------+--------+ CCA Distal47      13              heterogenous         +----------+--------+--------+--------+------------+--------+  ICA Prox  58      15      1-39%   heterogenous         +----------+--------+--------+--------+------------+--------+ ICA Distal58      19                                   +----------+--------+--------+--------+------------+--------+ ECA       55                                           +----------+--------+--------+--------+------------+--------+  Portions of this table do not appear on this page. +----------+--------+-------+--------+------------+           PSV cm/sEDV cmsDescribeArm Pressure +----------+--------+-------+--------+------------+ Subclavian50                                  +----------+--------+-------+--------+------------+ +---------+--------+--+--------+--+---------+ VertebralPSV cm/s52EDV cm/s11Antegrade +---------+--------+--+--------+--+---------+ Left Carotid Findings: +----------+--------+--------+--------+------------+--------+           PSV cm/sEDV cm/sStenosisDescribe    Comments +----------+--------+--------+--------+------------+--------+ CCA Prox  90      15              heterogenous         +----------+--------+--------+--------+------------+--------+ CCA Distal53      17              heterogenous         +----------+--------+--------+--------+------------+--------+ ICA Prox  60      24      1-39%   heterogenous         +----------+--------+--------+--------+------------+--------+ ICA Distal67      24                                   +----------+--------+--------+--------+------------+--------+ ECA       62      10                                   +----------+--------+--------+--------+------------+--------+ +----------+--------+--------+--------+------------+ SubclavianPSV cm/sEDV cm/sDescribeArm Pressure +----------+--------+--------+--------+------------+           60                                   +----------+--------+--------+--------+------------+ +---------+--------+--------+--------------+ VertebralPSV cm/sEDV cm/sNot identified +---------+--------+--------+--------------+  ABI Findings: +--------+------------------+-----+---------+--------+ Right   Rt Pressure (mmHg)IndexWaveform Comment  +--------+------------------+-----+---------+--------+ Brachial                       triphasic          +--------+------------------+-----+---------+--------+ ATA                            triphasic         +--------+------------------+-----+---------+--------+ PTA                            triphasic         +--------+------------------+-----+---------+--------+ +--------+------------------+-----+---------+-------+ Left    Lt Pressure (mmHg)IndexWaveform Comment +--------+------------------+-----+---------+-------+ Brachial  triphasic        +--------+------------------+-----+---------+-------+ ATA                            triphasic        +--------+------------------+-----+---------+-------+ PTA                            triphasic        +--------+------------------+-----+---------+-------+  Right Doppler Findings: +--------+--------+-----+---------+--------+ Site    PressureIndexDoppler  Comments +--------+--------+-----+---------+--------+ Brachial             triphasic         +--------+--------+-----+---------+--------+ Radial               biphasic          +--------+--------+-----+---------+--------+ Ulnar                biphasic          +--------+--------+-----+---------+--------+  Left Doppler Findings: +--------+--------+-----+---------+--------+ Site    PressureIndexDoppler  Comments +--------+--------+-----+---------+--------+ Brachial             triphasic         +--------+--------+-----+---------+--------+ Radial               biphasic          +--------+--------+-----+---------+--------+ Ulnar                biphasic          +--------+--------+-----+---------+--------+  Summary: Right Carotid: Velocities in the right ICA are consistent with a 1-39% stenosis. Left Carotid: Velocities in the left ICA are consistent with a 1-39% stenosis. Vertebrals: Right vertebral artery demonstrates antegrade flow. Left vertebral             artery was not visualized. Right Upper Extremity: Doppler waveforms  remain within normal limits with right radial compression. Doppler waveforms remain within normal limits with right ulnar compression. Left Upper Extremity: Doppler waveforms remain within normal limits with left radial compression. Doppler waveforms decrease 50% with left ulnar compression.     Preliminary    Telemetry    04/21/20 NSR- Personally Reviewed  ECG    No new tracing as of 04/21/20- Personally Reviewed  Cardiac Studies   LHC 04/20/20:  Conclusions: 1. Severe three-vessel coronary artery disease, including 80-90% ostial/proximal LAD stenosis, 70-90% lesions of ostial/proximal D1, D2, and ramus intermedius, and diffuse mid/distal LCx disease of up to 80% (dominant LCx).  90% stenosis of nondominant RCA is also present. 2. Normal left ventricular contraction with mildly elevated filling pressure.  Recommendations: 1. Given significant three-vessel disease, including ostial LAD involvement, recommend cardiac surgery consultation for CABG.  Given rapid progression of symptoms (often present at rest), patient will be admitted for inpatient surgical evaluation. 2. Aggressive secondary prevention, including addition of statin. 3. Obtain echocardiogram.  Patient Profile     75 y.o. female with a hx of asymptomatic coronary calcification, hypertension, hyperlipidemia and Hepatitis C s/p treatment who was admitted for OP LHC found to have severe three vessel CAD.   Assessment & Plan    1. Severe three vessel CAD: -Pt underwent OP LHC found to have severe three-vessel coronary artery disease, including 80-90% ostial/proximal LAD stenosis, 70-90% lesions of ostial/proximal D1, D2, and ramus intermedius, and diffuse mid/distal LCx disease of up to 80% (dominant LCx). 90% stenosis of nondominant RCA is also present. -Plan for TCTS consultation for revascularization -Echo with normal LVEF at 65-70%  with no RWMA and mild MR -Continue ASA -Needs high intensity statin>>unclear id allergy to  atorvastatin>>pravastatin as an allergy. May need OP lipid clinic referral  -Add low dose beta blocker   2. HTN: -Stable, 126/75>121/60>116/70 -Continue amlodipine, losartan, HCTZ -Add low dose BB  3. HLD: -LDL, 144 from 03/21/2018 -On PTA  -Start high intensity? -May need OP referral to lipid clinic for PCSK9 inhibitor if poor lipid response     Signed, Kathyrn Drown NP-C [place] Pager: [phone] 04/21/2020, 9:47 AM     For questions or updates, please contact   Please consult [email or web address] for contact info under Cardiology/STEMI.

## 2020-04-21 NOTE — Progress Notes (Signed)
Pre cabg has been completed.   Preliminary results in CV Proc.   Virginia Delgado 04/21/2020 9:11 AM

## 2020-04-21 NOTE — Progress Notes (Signed)
CARDIAC REHAB PHASE I   Preop education completed with pt. Pt given IS, able to demonstrate 1250. Reviewed importance of IS use, walks, and sternal precautions. Pt given in-the-tube sheet along with cardiac surgery booklet. Reviewed importance of having care 24/7 after discharge. Pt denies further questions or concerns at this time. Reading cardiac surgery booklet.  [id number] Rufina Falco, RN BSN 04/21/2020 2:09 PM

## 2020-04-21 NOTE — Anesthesia Preprocedure Evaluation (Addendum)
Anesthesia Evaluation  Patient identified by MRN, date of birth, ID band Patient awake    Reviewed: Allergy & Precautions, NPO status , Patient's Chart, lab work & pertinent test results  Airway Mallampati: III  TM Distance: >3 FB Neck ROM: Full    Dental  (+) Teeth Intact, Dental Advisory Given   Pulmonary neg pulmonary ROS,    Pulmonary exam normal breath sounds clear to auscultation       Cardiovascular hypertension, Pt. on medications + angina + CAD  Normal cardiovascular exam Rhythm:Regular Rate:Normal  04/20/20 Echo: 1. Left ventricular ejection fraction, by estimation, is 65 to 70%. The  left ventricle has normal function. The left ventricle has no regional  wall motion abnormalities. Left ventricular diastolic parameters are  indeterminate.  2. Right ventricular systolic function is normal. The right ventricular  size is normal. There is normal pulmonary artery systolic pressure.  3. The mitral valve is normal in structure. Mild mitral valve  regurgitation.  4. The aortic valve is tricuspid. Aortic valve regurgitation is mild.  Mild to moderate aortic valve sclerosis/calcification is present, without  any evidence of aortic stenosis.  5. The inferior vena cava is normal in size with greater than 50%  respiratory variability, suggesting right atrial pressure of 3 mmHg.    Neuro/Psych negative neurological ROS     GI/Hepatic negative GI ROS, (+) Hepatitis -, C  Endo/Other  negative endocrine ROS  Renal/GU Renal InsufficiencyRenal disease     Musculoskeletal negative musculoskeletal ROS (+)   Abdominal   Peds  Hematology negative hematology ROS (+)   Anesthesia Other Findings   Reproductive/Obstetrics                            Anesthesia Physical Anesthesia Plan  ASA: IV  Anesthesia Plan: General   Post-op Pain Management:    Induction: Intravenous  PONV Risk Score  and Plan: 3 and Treatment may vary due to age or medical condition and Midazolam  Airway Management Planned: Oral ETT  Additional Equipment: Arterial line, CVP, PA Cath, TEE and Ultrasound Guidance Line Placement  Intra-op Plan:   Post-operative Plan: Post-operative intubation/ventilation  Informed Consent: I have reviewed the patients History and Physical, chart, labs and discussed the procedure including the risks, benefits and alternatives for the proposed anesthesia with the patient or authorized representative who has indicated his/her understanding and acceptance.     Dental advisory given  Plan Discussed with: CRNA  Anesthesia Plan Comments:        Anesthesia Quick Evaluation

## 2020-04-21 NOTE — Progress Notes (Signed)
RT NOTES: ABG obtained and sent to lab. Lab tech notified.  

## 2020-04-21 NOTE — Progress Notes (Signed)
Patient education given on Lovenox . [place], [employer] rn

## 2020-04-22 ENCOUNTER — Inpatient Hospital Stay (HOSPITAL_COMMUNITY): Payer: Medicare HMO | Admitting: Anesthesiology

## 2020-04-22 ENCOUNTER — Inpatient Hospital Stay (HOSPITAL_COMMUNITY): Payer: Medicare HMO

## 2020-04-22 ENCOUNTER — Inpatient Hospital Stay (HOSPITAL_COMMUNITY)
Admission: RE | Disposition: A | Discharge status: Home / Self Care | Payer: Self-pay | Source: Home / Self Care | Attending: Cardiothoracic Surgery

## 2020-04-22 DIAGNOSIS — I480 Paroxysmal atrial fibrillation: Secondary | ICD-10-CM

## 2020-04-22 DIAGNOSIS — I10 Essential (primary) hypertension: Secondary | ICD-10-CM | POA: Diagnosis not present

## 2020-04-22 DIAGNOSIS — I97791 Other intraoperative cardiac functional disturbances during other surgery: Secondary | ICD-10-CM | POA: Diagnosis not present

## 2020-04-22 DIAGNOSIS — I25110 Atherosclerotic heart disease of native coronary artery with unstable angina pectoris: Secondary | ICD-10-CM

## 2020-04-22 DIAGNOSIS — D62 Acute posthemorrhagic anemia: Secondary | ICD-10-CM | POA: Diagnosis not present

## 2020-04-22 HISTORY — PX: TEE WITHOUT CARDIOVERSION: SHX5443

## 2020-04-22 HISTORY — PX: CORONARY ARTERY BYPASS GRAFT: SHX141

## 2020-04-22 HISTORY — PX: RADIAL ARTERY HARVEST: SHX5067

## 2020-04-22 HISTORY — PX: CLIPPING OF ATRIAL APPENDAGE: SHX5773

## 2020-04-22 LAB — ECHO INTRAOPERATIVE TEE
Height: 60.25 in
Weight: 2123.47 oz

## 2020-04-22 LAB — CBC
HCT: 41.2 % (ref 36.0–46.0)
HCT: 32 % — ABNORMAL LOW (ref 36.0–46.0)
HCT: 35.2 % — ABNORMAL LOW (ref 36.0–46.0)
Hemoglobin: 14 g/dL (ref 12.0–15.0)
Hemoglobin: 11.3 g/dL — ABNORMAL LOW (ref 12.0–15.0)
Hemoglobin: 11.9 g/dL — ABNORMAL LOW (ref 12.0–15.0)
MCH: 30.2 pg (ref 26.0–34.0)
MCH: 30.7 pg (ref 26.0–34.0)
MCH: 30.1 pg (ref 26.0–34.0)
MCHC: 34 g/dL (ref 30.0–36.0)
MCHC: 35.3 g/dL (ref 30.0–36.0)
MCHC: 33.8 g/dL (ref 30.0–36.0)
MCV: 88.8 fL (ref 80.0–100.0)
MCV: 87 fL (ref 80.0–100.0)
MCV: 89.1 fL (ref 80.0–100.0)
Platelets: 222 10*3/uL (ref 150–400)
Platelets: 110 10*3/uL — ABNORMAL LOW (ref 150–400)
Platelets: 118 10*3/uL — ABNORMAL LOW (ref 150–400)
RBC: 4.64 MIL/uL (ref 3.87–5.11)
RBC: 3.68 MIL/uL — ABNORMAL LOW (ref 3.87–5.11)
RBC: 3.95 MIL/uL (ref 3.87–5.11)
RDW: 12.5 % (ref 11.5–15.5)
RDW: 12.9 % (ref 11.5–15.5)
RDW: 13.1 % (ref 11.5–15.5)
WBC: 6.9 10*3/uL (ref 4.0–10.5)
WBC: 9.2 10*3/uL (ref 4.0–10.5)
WBC: 11.1 10*3/uL — ABNORMAL HIGH (ref 4.0–10.5)
nRBC: 0 % (ref 0.0–0.2)
nRBC: 0 % (ref 0.0–0.2)
nRBC: 0 % (ref 0.0–0.2)

## 2020-04-22 LAB — POCT I-STAT, CHEM 8
BUN: 17 mg/dL (ref 8–23)
BUN: 13 mg/dL (ref 8–23)
BUN: 15 mg/dL (ref 8–23)
BUN: 12 mg/dL (ref 8–23)
Calcium, Ion: 1.39 mmol/L (ref 1.15–1.40)
Calcium, Ion: 1.11 mmol/L — ABNORMAL LOW (ref 1.15–1.40)
Calcium, Ion: 1.38 mmol/L (ref 1.15–1.40)
Calcium, Ion: 1.07 mmol/L — ABNORMAL LOW (ref 1.15–1.40)
Chloride: 104 mmol/L (ref 98–111)
Chloride: 102 mmol/L (ref 98–111)
Chloride: 105 mmol/L (ref 98–111)
Chloride: 110 mmol/L (ref 98–111)
Creatinine, Ser: 0.8 mg/dL (ref 0.44–1.00)
Creatinine, Ser: 0.6 mg/dL (ref 0.44–1.00)
Creatinine, Ser: 0.8 mg/dL (ref 0.44–1.00)
Creatinine, Ser: 0.5 mg/dL (ref 0.44–1.00)
Glucose, Bld: 102 mg/dL — ABNORMAL HIGH (ref 70–99)
Glucose, Bld: 105 mg/dL — ABNORMAL HIGH (ref 70–99)
Glucose, Bld: 116 mg/dL — ABNORMAL HIGH (ref 70–99)
Glucose, Bld: 108 mg/dL — ABNORMAL HIGH (ref 70–99)
HCT: 35 % — ABNORMAL LOW (ref 36.0–46.0)
HCT: 24 % — ABNORMAL LOW (ref 36.0–46.0)
HCT: 29 % — ABNORMAL LOW (ref 36.0–46.0)
HCT: 26 % — ABNORMAL LOW (ref 36.0–46.0)
Hemoglobin: 11.9 g/dL — ABNORMAL LOW (ref 12.0–15.0)
Hemoglobin: 8.2 g/dL — ABNORMAL LOW (ref 12.0–15.0)
Hemoglobin: 9.9 g/dL — ABNORMAL LOW (ref 12.0–15.0)
Hemoglobin: 8.8 g/dL — ABNORMAL LOW (ref 12.0–15.0)
Potassium: 3.3 mmol/L — ABNORMAL LOW (ref 3.5–5.1)
Potassium: 3.1 mmol/L — ABNORMAL LOW (ref 3.5–5.1)
Potassium: 3.3 mmol/L — ABNORMAL LOW (ref 3.5–5.1)
Potassium: 3.6 mmol/L (ref 3.5–5.1)
Sodium: 140 mmol/L (ref 135–145)
Sodium: 138 mmol/L (ref 135–145)
Sodium: 140 mmol/L (ref 135–145)
Sodium: 141 mmol/L (ref 135–145)
TCO2: 28 mmol/L (ref 22–32)
TCO2: 25 mmol/L (ref 22–32)
TCO2: 27 mmol/L (ref 22–32)
TCO2: 28 mmol/L (ref 22–32)

## 2020-04-22 LAB — POCT I-STAT 7, (LYTES, BLD GAS, ICA,H+H)
Acid-Base Excess: 2 mmol/L (ref 0.0–2.0)
Acid-Base Excess: 0 mmol/L (ref 0.0–2.0)
Acid-Base Excess: 4 mmol/L — ABNORMAL HIGH (ref 0.0–2.0)
Acid-Base Excess: 3 mmol/L — ABNORMAL HIGH (ref 0.0–2.0)
Acid-Base Excess: 0 mmol/L (ref 0.0–2.0)
Acid-base deficit: 4 mmol/L — ABNORMAL HIGH (ref 0.0–2.0)
Acid-base deficit: 4 mmol/L — ABNORMAL HIGH (ref 0.0–2.0)
Bicarbonate: 27.3 mmol/L (ref 20.0–28.0)
Bicarbonate: 23.3 mmol/L (ref 20.0–28.0)
Bicarbonate: 26.1 mmol/L (ref 20.0–28.0)
Bicarbonate: 25.9 mmol/L (ref 20.0–28.0)
Bicarbonate: 25.6 mmol/L (ref 20.0–28.0)
Bicarbonate: 21.4 mmol/L (ref 20.0–28.0)
Bicarbonate: 21.3 mmol/L (ref 20.0–28.0)
Calcium, Ion: 1.38 mmol/L (ref 1.15–1.40)
Calcium, Ion: 1.03 mmol/L — ABNORMAL LOW (ref 1.15–1.40)
Calcium, Ion: 1.1 mmol/L — ABNORMAL LOW (ref 1.15–1.40)
Calcium, Ion: 1.06 mmol/L — ABNORMAL LOW (ref 1.15–1.40)
Calcium, Ion: 1.2 mmol/L (ref 1.15–1.40)
Calcium, Ion: 1.2 mmol/L (ref 1.15–1.40)
Calcium, Ion: 1.22 mmol/L (ref 1.15–1.40)
HCT: 35 % — ABNORMAL LOW (ref 36.0–46.0)
HCT: 23 % — ABNORMAL LOW (ref 36.0–46.0)
HCT: 24 % — ABNORMAL LOW (ref 36.0–46.0)
HCT: 28 % — ABNORMAL LOW (ref 36.0–46.0)
HCT: 32 % — ABNORMAL LOW (ref 36.0–46.0)
HCT: 31 % — ABNORMAL LOW (ref 36.0–46.0)
HCT: 33 % — ABNORMAL LOW (ref 36.0–46.0)
Hemoglobin: 11.9 g/dL — ABNORMAL LOW (ref 12.0–15.0)
Hemoglobin: 7.8 g/dL — ABNORMAL LOW (ref 12.0–15.0)
Hemoglobin: 8.2 g/dL — ABNORMAL LOW (ref 12.0–15.0)
Hemoglobin: 9.5 g/dL — ABNORMAL LOW (ref 12.0–15.0)
Hemoglobin: 10.9 g/dL — ABNORMAL LOW (ref 12.0–15.0)
Hemoglobin: 10.5 g/dL — ABNORMAL LOW (ref 12.0–15.0)
Hemoglobin: 11.2 g/dL — ABNORMAL LOW (ref 12.0–15.0)
O2 Saturation: 100 %
O2 Saturation: 100 %
O2 Saturation: 100 %
O2 Saturation: 100 %
O2 Saturation: 97 %
O2 Saturation: 95 %
O2 Saturation: 95 %
Patient temperature: 35.9
Patient temperature: 36.7
Patient temperature: 36.9
Potassium: 3.3 mmol/L — ABNORMAL LOW (ref 3.5–5.1)
Potassium: 3.4 mmol/L — ABNORMAL LOW (ref 3.5–5.1)
Potassium: 4.2 mmol/L (ref 3.5–5.1)
Potassium: 3.6 mmol/L (ref 3.5–5.1)
Potassium: 3.6 mmol/L (ref 3.5–5.1)
Potassium: 4.3 mmol/L (ref 3.5–5.1)
Potassium: 4.2 mmol/L (ref 3.5–5.1)
Sodium: 140 mmol/L (ref 135–145)
Sodium: 141 mmol/L (ref 135–145)
Sodium: 140 mmol/L (ref 135–145)
Sodium: 141 mmol/L (ref 135–145)
Sodium: 143 mmol/L (ref 135–145)
Sodium: 142 mmol/L (ref 135–145)
Sodium: 142 mmol/L (ref 135–145)
TCO2: 29 mmol/L (ref 22–32)
TCO2: 24 mmol/L (ref 22–32)
TCO2: 27 mmol/L (ref 22–32)
TCO2: 27 mmol/L (ref 22–32)
TCO2: 27 mmol/L (ref 22–32)
TCO2: 23 mmol/L (ref 22–32)
TCO2: 22 mmol/L (ref 22–32)
pCO2 arterial: 43 mmHg (ref 32.0–48.0)
pCO2 arterial: 29.6 mmHg — ABNORMAL LOW (ref 32.0–48.0)
pCO2 arterial: 27.1 mmHg — ABNORMAL LOW (ref 32.0–48.0)
pCO2 arterial: 33.3 mmHg (ref 32.0–48.0)
pCO2 arterial: 42.6 mmHg (ref 32.0–48.0)
pCO2 arterial: 38.7 mmHg (ref 32.0–48.0)
pCO2 arterial: 40.1 mmHg (ref 32.0–48.0)
pH, Arterial: 7.411 (ref 7.350–7.450)
pH, Arterial: 7.504 — ABNORMAL HIGH (ref 7.350–7.450)
pH, Arterial: 7.591 — ABNORMAL HIGH (ref 7.350–7.450)
pH, Arterial: 7.499 — ABNORMAL HIGH (ref 7.350–7.450)
pH, Arterial: 7.382 (ref 7.350–7.450)
pH, Arterial: 7.349 — ABNORMAL LOW (ref 7.350–7.450)
pH, Arterial: 7.332 — ABNORMAL LOW (ref 7.350–7.450)
pO2, Arterial: 362 mmHg — ABNORMAL HIGH (ref 83.0–108.0)
pO2, Arterial: 320 mmHg — ABNORMAL HIGH (ref 83.0–108.0)
pO2, Arterial: 382 mmHg — ABNORMAL HIGH (ref 83.0–108.0)
pO2, Arterial: 469 mmHg — ABNORMAL HIGH (ref 83.0–108.0)
pO2, Arterial: 87 mmHg (ref 83.0–108.0)
pO2, Arterial: 81 mmHg — ABNORMAL LOW (ref 83.0–108.0)
pO2, Arterial: 81 mmHg — ABNORMAL LOW (ref 83.0–108.0)

## 2020-04-22 LAB — POCT I-STAT EG7
Acid-base deficit: 1 mmol/L (ref 0.0–2.0)
Bicarbonate: 23.8 mmol/L (ref 20.0–28.0)
Calcium, Ion: 1.09 mmol/L — ABNORMAL LOW (ref 1.15–1.40)
HCT: 22 % — ABNORMAL LOW (ref 36.0–46.0)
Hemoglobin: 7.5 g/dL — ABNORMAL LOW (ref 12.0–15.0)
O2 Saturation: 70 %
Potassium: 3.4 mmol/L — ABNORMAL LOW (ref 3.5–5.1)
Sodium: 142 mmol/L (ref 135–145)
TCO2: 25 mmol/L (ref 22–32)
pCO2, Ven: 38.9 mmHg — ABNORMAL LOW (ref 44.0–60.0)
pH, Ven: 7.394 (ref 7.250–7.430)
pO2, Ven: 37 mmHg (ref 32.0–45.0)

## 2020-04-22 LAB — BASIC METABOLIC PANEL
Anion gap: 11 (ref 5–15)
Anion gap: 8 (ref 5–15)
BUN: 16 mg/dL (ref 8–23)
BUN: 11 mg/dL (ref 8–23)
CO2: 23 mmol/L (ref 22–32)
CO2: 20 mmol/L — ABNORMAL LOW (ref 22–32)
Calcium: 10.5 mg/dL — ABNORMAL HIGH (ref 8.9–10.3)
Calcium: 8.1 mg/dL — ABNORMAL LOW (ref 8.9–10.3)
Chloride: 104 mmol/L (ref 98–111)
Chloride: 109 mmol/L (ref 98–111)
Creatinine, Ser: 1.06 mg/dL — ABNORMAL HIGH (ref 0.44–1.00)
Creatinine, Ser: 0.86 mg/dL (ref 0.44–1.00)
GFR, Estimated: 56 mL/min — ABNORMAL LOW (ref >60)
GFR, Estimated: >60 mL/min (ref >60)
Glucose, Bld: 97 mg/dL (ref 70–99)
Glucose, Bld: 170 mg/dL — ABNORMAL HIGH (ref 70–99)
Potassium: 3.4 mmol/L — ABNORMAL LOW (ref 3.5–5.1)
Potassium: 4 mmol/L (ref 3.5–5.1)
Sodium: 138 mmol/L (ref 135–145)
Sodium: 137 mmol/L (ref 135–145)

## 2020-04-22 LAB — PROTIME-INR
INR: 1.5 — ABNORMAL HIGH (ref 0.8–1.2)
Prothrombin Time: 17.3 seconds — ABNORMAL HIGH (ref 11.4–15.2)

## 2020-04-22 LAB — GLUCOSE, CAPILLARY
Glucose-Capillary: 129 mg/dL — ABNORMAL HIGH (ref 70–99)
Glucose-Capillary: 124 mg/dL — ABNORMAL HIGH (ref 70–99)
Glucose-Capillary: 108 mg/dL — ABNORMAL HIGH (ref 70–99)
Glucose-Capillary: 133 mg/dL — ABNORMAL HIGH (ref 70–99)
Glucose-Capillary: 146 mg/dL — ABNORMAL HIGH (ref 70–99)
Glucose-Capillary: 157 mg/dL — ABNORMAL HIGH (ref 70–99)
Glucose-Capillary: 157 mg/dL — ABNORMAL HIGH (ref 70–99)
Glucose-Capillary: 183 mg/dL — ABNORMAL HIGH (ref 70–99)
Glucose-Capillary: 161 mg/dL — ABNORMAL HIGH (ref 70–99)
Glucose-Capillary: 114 mg/dL — ABNORMAL HIGH (ref 70–99)

## 2020-04-22 LAB — PLATELET COUNT: Platelets: 129 10*3/uL — ABNORMAL LOW (ref 150–400)

## 2020-04-22 LAB — APTT: aPTT: 33 seconds (ref 24–36)

## 2020-04-22 LAB — MAGNESIUM: Magnesium: 3.1 mg/dL — ABNORMAL HIGH (ref 1.7–2.4)

## 2020-04-22 LAB — HEMOGLOBIN AND HEMATOCRIT, BLOOD
HCT: 31.5 % — ABNORMAL LOW (ref 36.0–46.0)
Hemoglobin: 10.7 g/dL — ABNORMAL LOW (ref 12.0–15.0)

## 2020-04-22 SURGERY — CORONARY ARTERY BYPASS GRAFTING (CABG)
Anesthesia: General | Site: Chest | Laterality: Right

## 2020-04-22 MED ORDER — ORAL CARE MOUTH RINSE
15.0000 mL | Freq: Two times a day (BID) | OROMUCOSAL | Status: DC
  Administered 2020-04-22 – 2020-04-25 (×7): 15 mL via OROMUCOSAL

## 2020-04-22 MED ORDER — METOPROLOL TARTRATE 25 MG/10 ML ORAL SUSPENSION: 12.5000 mg | Freq: Two times a day (BID) | ORAL | Status: DC

## 2020-04-22 MED ORDER — ACETAMINOPHEN 500 MG PO TABS
1000.0000 mg | ORAL_TABLET | Freq: Four times a day (QID) | ORAL | Status: DC
  Administered 2020-04-22 – 2020-04-27 (×14): 1000 mg via ORAL
  Filled 2020-04-22 (×15): qty 2

## 2020-04-22 MED ORDER — ALBUMIN HUMAN 5 % IV SOLN
250.0000 mL | INTRAVENOUS | Status: AC | PRN
  Administered 2020-04-22 (×4): 12.5 g via INTRAVENOUS
  Filled 2020-04-22: qty 250

## 2020-04-22 MED ORDER — FENTANYL CITRATE (PF) 250 MCG/5ML IJ SOLN
INTRAMUSCULAR | Status: DC | PRN
  Administered 2020-04-22: 50 ug via INTRAVENOUS
  Administered 2020-04-22 (×2): 100 ug via INTRAVENOUS
  Administered 2020-04-22: 50 ug via INTRAVENOUS
  Administered 2020-04-22 (×2): 100 ug via INTRAVENOUS
  Administered 2020-04-22: 200 ug via INTRAVENOUS
  Administered 2020-04-22 (×3): 50 ug via INTRAVENOUS

## 2020-04-22 MED ORDER — SODIUM CHLORIDE 0.9% FLUSH
10.0000 mL | Freq: Two times a day (BID) | INTRAVENOUS | Status: DC
  Administered 2020-04-22: 10 mL
  Administered 2020-04-22: 20 mL
  Administered 2020-04-23 – 2020-04-25 (×5): 10 mL

## 2020-04-22 MED ORDER — DEXTROSE 50 % IV SOLN: 0.0000 mL | INTRAVENOUS | Status: DC | PRN

## 2020-04-22 MED ORDER — ROCURONIUM BROMIDE 10 MG/ML (PF) SYRINGE
PREFILLED_SYRINGE | INTRAVENOUS | Status: AC
  Filled 2020-04-22: qty 10

## 2020-04-22 MED ORDER — LACTATED RINGERS IV SOLN: INTRAVENOUS | Status: DC

## 2020-04-22 MED ORDER — LACTATED RINGERS IV SOLN: INTRAVENOUS | Status: DC | PRN

## 2020-04-22 MED ORDER — STERILE WATER FOR INJECTION IJ SOLN
INTRAMUSCULAR | Status: AC
  Filled 2020-04-22: qty 10

## 2020-04-22 MED ORDER — SODIUM CHLORIDE 0.9% FLUSH: 3.0000 mL | INTRAVENOUS | Status: DC | PRN

## 2020-04-22 MED ORDER — SODIUM CHLORIDE 0.9 % IV SOLN
1.5000 g | Freq: Two times a day (BID) | INTRAVENOUS | Status: AC
  Administered 2020-04-22 – 2020-04-24 (×4): 1.5 g via INTRAVENOUS
  Filled 2020-04-22 (×3): qty 1.5

## 2020-04-22 MED ORDER — INSULIN REGULAR(HUMAN) IN NACL 100-0.9 UT/100ML-% IV SOLN: INTRAVENOUS | Status: DC

## 2020-04-22 MED ORDER — FENTANYL CITRATE (PF) 250 MCG/5ML IJ SOLN
INTRAMUSCULAR | Status: AC
  Filled 2020-04-22: qty 25

## 2020-04-22 MED ORDER — ACETAMINOPHEN 160 MG/5ML PO SOLN: 1000.0000 mg | Freq: Four times a day (QID) | ORAL | Status: DC

## 2020-04-22 MED ORDER — BUPIVACAINE HCL 0.5 % IJ SOLN
INTRAMUSCULAR | Status: AC
  Filled 2020-04-22: qty 1

## 2020-04-22 MED ORDER — POTASSIUM CHLORIDE 10 MEQ/50ML IV SOLN
10.0000 meq | INTRAVENOUS | Status: AC
  Administered 2020-04-22 (×3): 10 meq via INTRAVENOUS

## 2020-04-22 MED ORDER — MORPHINE SULFATE (PF) 2 MG/ML IV SOLN
1.0000 mg | INTRAVENOUS | Status: DC | PRN
Start: 2020-04-22 — End: 2020-04-26
  Administered 2020-04-24: 2 mg via INTRAVENOUS
  Filled 2020-04-22 (×2): qty 1

## 2020-04-22 MED ORDER — VANCOMYCIN HCL 1000 MG IV SOLR
INTRAVENOUS | Status: AC
  Filled 2020-04-22: qty 3000

## 2020-04-22 MED ORDER — SODIUM CHLORIDE 0.45 % IV SOLN: INTRAVENOUS | Status: DC | PRN

## 2020-04-22 MED ORDER — PLATELET RICH PLASMA OPTIME
Status: DC | PRN
  Administered 2020-04-22: 10 mL

## 2020-04-22 MED ORDER — BUPIVACAINE LIPOSOME 1.3 % IJ SUSP
INTRAMUSCULAR | Status: DC | PRN
  Administered 2020-04-22: 50 mL

## 2020-04-22 MED ORDER — ACETAMINOPHEN 650 MG RE SUPP: 650.0000 mg | Freq: Once | RECTAL | Status: AC

## 2020-04-22 MED ORDER — VANCOMYCIN HCL 1000 MG IV SOLR
INTRAVENOUS | Status: DC | PRN
Start: 2020-04-22 — End: 2020-04-22
  Administered 2020-04-22: 3000 mg

## 2020-04-22 MED ORDER — PHENYLEPHRINE HCL-NACL 20-0.9 MG/250ML-% IV SOLN: 0.0000 ug/min | INTRAVENOUS | Status: DC

## 2020-04-22 MED ORDER — ROCURONIUM BROMIDE 10 MG/ML (PF) SYRINGE
PREFILLED_SYRINGE | INTRAVENOUS | Status: DC | PRN
  Administered 2020-04-22: 50 mg via INTRAVENOUS
  Administered 2020-04-22: 30 mg via INTRAVENOUS
  Administered 2020-04-22: 50 mg via INTRAVENOUS
  Administered 2020-04-22: 100 mg via INTRAVENOUS

## 2020-04-22 MED ORDER — HEPARIN SODIUM (PORCINE) 1000 UNIT/ML IJ SOLN
INTRAMUSCULAR | Status: DC | PRN
  Administered 2020-04-22: 21000 [IU] via INTRAVENOUS

## 2020-04-22 MED ORDER — SODIUM CHLORIDE (PF) 0.9 % IJ SOLN
INTRAMUSCULAR | Status: AC
  Filled 2020-04-22: qty 10

## 2020-04-22 MED ORDER — MIDAZOLAM HCL (PF) 10 MG/2ML IJ SOLN
INTRAMUSCULAR | Status: AC
  Filled 2020-04-22: qty 2

## 2020-04-22 MED ORDER — SODIUM CHLORIDE 0.9 % IV SOLN: 250.0000 mL | INTRAVENOUS | Status: DC

## 2020-04-22 MED ORDER — PROTAMINE SULFATE 10 MG/ML IV SOLN
INTRAVENOUS | Status: DC | PRN
  Administered 2020-04-22: 200 mg via INTRAVENOUS

## 2020-04-22 MED ORDER — SODIUM CHLORIDE 0.9% FLUSH: 10.0000 mL | INTRAVENOUS | Status: DC | PRN

## 2020-04-22 MED ORDER — CHLORHEXIDINE GLUCONATE CLOTH 2 % EX PADS
6.0000 | MEDICATED_PAD | Freq: Every day | CUTANEOUS | Status: DC
  Administered 2020-04-22 – 2020-04-25 (×4): 6 via TOPICAL

## 2020-04-22 MED ORDER — MIDAZOLAM HCL 2 MG/2ML IJ SOLN: 2.0000 mg | INTRAMUSCULAR | Status: DC | PRN

## 2020-04-22 MED ORDER — SODIUM CHLORIDE 0.9 % IV SOLN: INTRAVENOUS | Status: DC

## 2020-04-22 MED ORDER — FAMOTIDINE IN NACL 20-0.9 MG/50ML-% IV SOLN
20.0000 mg | Freq: Two times a day (BID) | INTRAVENOUS | Status: DC
  Administered 2020-04-22: 20 mg via INTRAVENOUS
  Filled 2020-04-22: qty 50

## 2020-04-22 MED ORDER — VANCOMYCIN HCL IN DEXTROSE 1-5 GM/200ML-% IV SOLN
1000.0000 mg | Freq: Once | INTRAVENOUS | Status: AC
  Administered 2020-04-22: 1000 mg via INTRAVENOUS
  Filled 2020-04-22: qty 200

## 2020-04-22 MED ORDER — PLATELET POOR PLASMA OPTIME: Status: DC | PRN

## 2020-04-22 MED ORDER — ALBUMIN HUMAN 5 % IV SOLN: 12.5000 g | Freq: Once | INTRAVENOUS | Status: DC

## 2020-04-22 MED ORDER — METOPROLOL TARTRATE 12.5 MG HALF TABLET
12.5000 mg | ORAL_TABLET | Freq: Two times a day (BID) | ORAL | Status: DC
  Administered 2020-04-23 – 2020-04-24 (×4): 12.5 mg via ORAL
  Filled 2020-04-22 (×4): qty 1

## 2020-04-22 MED ORDER — SODIUM CHLORIDE 0.9% FLUSH
3.0000 mL | Freq: Two times a day (BID) | INTRAVENOUS | Status: DC
  Administered 2020-04-23 – 2020-04-25 (×6): 3 mL via INTRAVENOUS

## 2020-04-22 MED ORDER — ASPIRIN 81 MG PO CHEW
324.0000 mg | CHEWABLE_TABLET | Freq: Every day | ORAL | Status: DC
  Filled 2020-04-22: qty 4

## 2020-04-22 MED ORDER — BUPIVACAINE LIPOSOME 1.3 % IJ SUSP
20.0000 mL | INTRAMUSCULAR | Status: DC
  Filled 2020-04-22: qty 20

## 2020-04-22 MED ORDER — OXYCODONE HCL 5 MG PO TABS
5.0000 mg | ORAL_TABLET | ORAL | Status: DC | PRN
  Administered 2020-04-25 (×2): 10 mg via ORAL
  Administered 2020-04-25: 5 mg via ORAL
  Filled 2020-04-22: qty 2
  Filled 2020-04-22: qty 1
  Filled 2020-04-22: qty 2

## 2020-04-22 MED ORDER — BUPIVACAINE HCL (PF) 0.5 % IJ SOLN
INTRAMUSCULAR | Status: AC
  Filled 2020-04-22: qty 30

## 2020-04-22 MED ORDER — DEXMEDETOMIDINE HCL IN NACL 400 MCG/100ML IV SOLN: 0.0000 ug/kg/h | INTRAVENOUS | Status: DC

## 2020-04-22 MED ORDER — BISACODYL 5 MG PO TBEC
10.0000 mg | DELAYED_RELEASE_TABLET | Freq: Every day | ORAL | Status: DC
  Administered 2020-04-24: 10 mg via ORAL
  Filled 2020-04-22 (×2): qty 2

## 2020-04-22 MED ORDER — PHENYLEPHRINE 40 MCG/ML (10ML) SYRINGE FOR IV PUSH (FOR BLOOD PRESSURE SUPPORT)
PREFILLED_SYRINGE | INTRAVENOUS | Status: DC | PRN
  Administered 2020-04-22: 120 ug via INTRAVENOUS

## 2020-04-22 MED ORDER — MAGNESIUM SULFATE 4 GM/100ML IV SOLN
4.0000 g | Freq: Once | INTRAVENOUS | Status: AC
  Administered 2020-04-22: 4 g via INTRAVENOUS
  Filled 2020-04-22: qty 100

## 2020-04-22 MED ORDER — DOCUSATE SODIUM 100 MG PO CAPS
200.0000 mg | ORAL_CAPSULE | Freq: Every day | ORAL | Status: DC
  Administered 2020-04-24 – 2020-04-25 (×2): 200 mg via ORAL
  Filled 2020-04-22 (×3): qty 2

## 2020-04-22 MED ORDER — PLASMA-LYTE 148 IV SOLN
INTRAVENOUS | Status: DC | PRN
  Administered 2020-04-22: 200 mL

## 2020-04-22 MED ORDER — BISACODYL 10 MG RE SUPP: 10.0000 mg | Freq: Every day | RECTAL | Status: DC

## 2020-04-22 MED ORDER — ONDANSETRON HCL 4 MG/2ML IJ SOLN
4.0000 mg | Freq: Four times a day (QID) | INTRAMUSCULAR | Status: DC | PRN
  Administered 2020-04-22 – 2020-04-24 (×4): 4 mg via INTRAVENOUS
  Filled 2020-04-22 (×4): qty 2

## 2020-04-22 MED ORDER — ALBUMIN HUMAN 5 % IV SOLN: INTRAVENOUS | Status: DC | PRN

## 2020-04-22 MED ORDER — 0.9 % SODIUM CHLORIDE (POUR BTL) OPTIME
TOPICAL | Status: DC | PRN
  Administered 2020-04-22: 5000 mL

## 2020-04-22 MED ORDER — PANTOPRAZOLE SODIUM 40 MG PO TBEC
40.0000 mg | DELAYED_RELEASE_TABLET | Freq: Every day | ORAL | Status: DC
  Administered 2020-04-24 – 2020-04-27 (×3): 40 mg via ORAL
  Filled 2020-04-22 (×4): qty 1

## 2020-04-22 MED ORDER — METOPROLOL TARTRATE 5 MG/5ML IV SOLN: 2.5000 mg | INTRAVENOUS | Status: DC | PRN

## 2020-04-22 MED ORDER — ACETAMINOPHEN 160 MG/5ML PO SOLN
650.0000 mg | Freq: Once | ORAL | Status: AC
  Administered 2020-04-22: 650 mg

## 2020-04-22 MED ORDER — PROPOFOL 10 MG/ML IV BOLUS
INTRAVENOUS | Status: AC
  Filled 2020-04-22: qty 40

## 2020-04-22 MED ORDER — HEPARIN SODIUM (PORCINE) 1000 UNIT/ML IJ SOLN
INTRAMUSCULAR | Status: AC
  Filled 2020-04-22: qty 1

## 2020-04-22 MED ORDER — PLASMA-LYTE A IV SOLN: INTRAVENOUS | Status: DC

## 2020-04-22 MED ORDER — TRAMADOL HCL 50 MG PO TABS
50.0000 mg | ORAL_TABLET | ORAL | Status: DC | PRN
Start: 2020-04-22 — End: 2020-04-27
  Administered 2020-04-22 – 2020-04-25 (×4): 100 mg via ORAL
  Filled 2020-04-22 (×4): qty 2

## 2020-04-22 MED ORDER — LACTATED RINGERS IV SOLN
500.0000 mL | Freq: Once | INTRAVENOUS | Status: AC | PRN
  Administered 2020-04-22: 500 mL via INTRAVENOUS

## 2020-04-22 MED ORDER — PROPOFOL 10 MG/ML IV BOLUS
INTRAVENOUS | Status: DC | PRN
  Administered 2020-04-22: 110 mg via INTRAVENOUS

## 2020-04-22 MED ORDER — PROTAMINE SULFATE 10 MG/ML IV SOLN
INTRAVENOUS | Status: AC
  Filled 2020-04-22: qty 25

## 2020-04-22 MED ORDER — CHLORHEXIDINE GLUCONATE CLOTH 2 % EX PADS
6.0000 | MEDICATED_PAD | Freq: Every day | CUTANEOUS | Status: DC
  Administered 2020-04-24: 6 via TOPICAL

## 2020-04-22 MED ORDER — CHLORHEXIDINE GLUCONATE 0.12 % MT SOLN
15.0000 mL | OROMUCOSAL | Status: AC
  Administered 2020-04-22: 15 mL via OROMUCOSAL

## 2020-04-22 MED ORDER — SODIUM CHLORIDE 0.9 % IV SOLN: INTRAVENOUS | Status: DC | PRN

## 2020-04-22 MED ORDER — ASPIRIN EC 325 MG PO TBEC
325.0000 mg | DELAYED_RELEASE_TABLET | Freq: Every day | ORAL | Status: DC
  Administered 2020-04-23 – 2020-04-27 (×5): 325 mg via ORAL
  Filled 2020-04-22 (×5): qty 1

## 2020-04-22 MED ORDER — MIDAZOLAM HCL 5 MG/5ML IJ SOLN
INTRAMUSCULAR | Status: DC | PRN
  Administered 2020-04-22: 2 mg via INTRAVENOUS
  Administered 2020-04-22 (×2): 1 mg via INTRAVENOUS

## 2020-04-22 MED ORDER — NITROGLYCERIN IN D5W 200-5 MCG/ML-% IV SOLN: 5.0000 ug/min | INTRAVENOUS | Status: DC

## 2020-04-22 SURGICAL SUPPLY — 109 items
ADAPTER CARDIO PERF ANTE/RETRO (ADAPTER) ×6 IMPLANT
APL SRG 7X2 LUM MLBL SLNT (VASCULAR PRODUCTS)
APPLICATOR TIP COSEAL (VASCULAR PRODUCTS) IMPLANT
APPLIER CLIP 9.375 SM OPEN (CLIP) ×6
APR CLP SM 9.3 20 MLT OPN (CLIP) ×1
ATRICLIP EXCLUSION VLAA 35 (Miscellaneous) ×5 IMPLANT
ATRICLIP EXCLUSION VLAA 35MM (Miscellaneous) ×1 IMPLANT
BAG DECANTER FOR FLEXI CONT (MISCELLANEOUS) ×6 IMPLANT
BLADE CLIPPER SURG (BLADE) ×6 IMPLANT
BLADE STERNUM SYSTEM 6 (BLADE) ×6 IMPLANT
BLADE SURG 15 STRL LF DISP TIS (BLADE) ×4 IMPLANT
BLADE SURG 15 STRL SS (BLADE) ×6
BNDG ELASTIC 4X5.8 VLCR STR LF (GAUZE/BANDAGES/DRESSINGS) ×6 IMPLANT
BNDG ELASTIC 6X5.8 VLCR STR LF (GAUZE/BANDAGES/DRESSINGS) ×6 IMPLANT
BNDG GAUZE ELAST 4 BULKY (GAUZE/BANDAGES/DRESSINGS) ×6 IMPLANT
CANISTER SUCT 3000ML PPV (MISCELLANEOUS) ×6 IMPLANT
CATH CPB KIT HENDRICKSON (MISCELLANEOUS) ×6 IMPLANT
CATH ROBINSON RED A/P 18FR (CATHETERS) ×12 IMPLANT
CLIP APPLIE 9.375 SM OPEN (CLIP) ×4 IMPLANT
CLIP RETRACTION 3.0MM CORONARY (MISCELLANEOUS) ×6 IMPLANT
CLIP VESOCCLUDE MED 24/CT (CLIP) IMPLANT
CLIP VESOCCLUDE SM WIDE 24/CT (CLIP) ×18 IMPLANT
COVER MAYO STAND STRL (DRAPES) ×6 IMPLANT
CUFF TOURN SGL QUICK 18X4 (TOURNIQUET CUFF) IMPLANT
CUFF TOURN SGL QUICK 24 (TOURNIQUET CUFF)
CUFF TRNQT CYL 24X4X16.5-23 (TOURNIQUET CUFF) IMPLANT
DERMABOND ADVANCED (GAUZE/BANDAGES/DRESSINGS) ×2
DERMABOND ADVANCED .7 DNX12 (GAUZE/BANDAGES/DRESSINGS) ×4 IMPLANT
DEVICE EXCLUSIN ATRCLP VLAA 35 (Miscellaneous) ×4 IMPLANT
DRAIN CHANNEL 28F RND 3/8 FF (WOUND CARE) ×18 IMPLANT
DRAPE CARDIOVASCULAR INCISE (DRAPES) ×6
DRAPE EXTREMITY T 121X128X90 (DISPOSABLE) ×6 IMPLANT
DRAPE HALF SHEET 40X57 (DRAPES) ×6 IMPLANT
DRAPE SLUSH/WARMER DISC (DRAPES) ×6 IMPLANT
DRAPE SRG 135X102X78XABS (DRAPES) ×4 IMPLANT
DRESSING PREVENA PLUS CUSTOM (GAUZE/BANDAGES/DRESSINGS) ×4 IMPLANT
DRSG AQUACEL AG ADV 3.5X14 (GAUZE/BANDAGES/DRESSINGS) ×6 IMPLANT
DRSG PREVENA PLUS CUSTOM (GAUZE/BANDAGES/DRESSINGS) ×6
ELECT CAUTERY BLADE 6.4 (BLADE) ×6 IMPLANT
ELECT REM PT RETURN 9FT ADLT (ELECTROSURGICAL) ×12
ELECTRODE REM PT RTRN 9FT ADLT (ELECTROSURGICAL) ×8 IMPLANT
FELT TEFLON 1X6 (MISCELLANEOUS) ×12 IMPLANT
GAUZE SPONGE 4X4 12PLY STRL (GAUZE/BANDAGES/DRESSINGS) ×12 IMPLANT
GEL ULTRASOUND 20GR AQUASONIC (MISCELLANEOUS) ×6 IMPLANT
GLOVE BIOGEL PI IND STRL 6 (GLOVE) ×4 IMPLANT
GLOVE BIOGEL PI INDICATOR 6 (GLOVE) ×2
GLOVE NEODERM STRL 7.5  LF PF (GLOVE) ×12
GLOVE NEODERM STRL 7.5 LF PF (GLOVE) ×12 IMPLANT
GLOVE SURG NEODERM 7.5  LF PF (GLOVE) ×6
GOWN STRL REUS W/ TWL LRG LVL3 (GOWN DISPOSABLE) ×16 IMPLANT
GOWN STRL REUS W/TWL LRG LVL3 (GOWN DISPOSABLE) ×24
HEMOSTAT POWDER SURGIFOAM 1G (HEMOSTASIS) ×12 IMPLANT
INSERT FOGARTY XLG (MISCELLANEOUS) ×6 IMPLANT
INSERT SUTURE HOLDER (MISCELLANEOUS) ×6 IMPLANT
KIT APPLICATOR RATIO 11:1 (KITS) ×6 IMPLANT
KIT BASIN OR (CUSTOM PROCEDURE TRAY) ×6 IMPLANT
KIT DRSG PREVENA PLUS 7DAY 125 (MISCELLANEOUS) ×6 IMPLANT
KIT SUCTION CATH 14FR (SUCTIONS) ×6 IMPLANT
KIT TURNOVER KIT B (KITS) ×6 IMPLANT
KIT VASOVIEW HEMOPRO 2 VH 4000 (KITS) ×6 IMPLANT
MARKER GRAFT CORONARY BYPASS (MISCELLANEOUS) ×18 IMPLANT
NEEDLE 18GX1X1/2 (RX/OR ONLY) (NEEDLE) ×6 IMPLANT
NS IRRIG 1000ML POUR BTL (IV SOLUTION) ×30 IMPLANT
PACK E OPEN HEART (SUTURE) ×6 IMPLANT
PACK OPEN HEART (CUSTOM PROCEDURE TRAY) ×6 IMPLANT
PACK PLATELET PROCEDURE 60 (MISCELLANEOUS) ×6 IMPLANT
PACK SPY-PHI (KITS) ×6 IMPLANT
PAD ARMBOARD 7.5X6 YLW CONV (MISCELLANEOUS) ×12 IMPLANT
PAD ELECT DEFIB RADIOL ZOLL (MISCELLANEOUS) ×6 IMPLANT
PENCIL BUTTON HOLSTER BLD 10FT (ELECTRODE) ×6 IMPLANT
POSITIONER HEAD DONUT 9IN (MISCELLANEOUS) ×6 IMPLANT
POWDER SURGICEL 3.0 GRAM (HEMOSTASIS) ×6 IMPLANT
SEALANT SURG COSEAL 4ML (VASCULAR PRODUCTS) ×6 IMPLANT
SEALANT SURG COSEAL 8ML (VASCULAR PRODUCTS) ×6 IMPLANT
SET CARDIOPLEGIA MPS 5001102 (MISCELLANEOUS) ×6 IMPLANT
SHEARS HARMONIC 9CM CVD (BLADE) ×6 IMPLANT
STAPLER VISISTAT 35W (STAPLE) ×12 IMPLANT
SUT BONE WAX W31G (SUTURE) ×6 IMPLANT
SUT MNCRL AB 3-0 PS2 18 (SUTURE) ×12 IMPLANT
SUT PDS AB 1 CTX 36 (SUTURE) ×12 IMPLANT
SUT PROLENE 3 0 SH DA (SUTURE) ×12 IMPLANT
SUT PROLENE 4 0 SH DA (SUTURE) ×6 IMPLANT
SUT PROLENE 5 0 C 1 36 (SUTURE) IMPLANT
SUT PROLENE 6 0 C 1 30 (SUTURE) ×18 IMPLANT
SUT PROLENE 8 0 BV175 6 (SUTURE) ×6 IMPLANT
SUT PROLENE BLUE 7 0 (SUTURE) ×6 IMPLANT
SUT SILK 2 0 SH CR/8 (SUTURE) IMPLANT
SUT SILK 3 0 SH CR/8 (SUTURE) IMPLANT
SUT STEEL 6MS V (SUTURE) ×6 IMPLANT
SUT STEEL SZ 6 DBL 3X14 BALL (SUTURE) ×6 IMPLANT
SUT VIC AB 2-0 CT1 27 (SUTURE) ×6
SUT VIC AB 2-0 CT1 TAPERPNT 27 (SUTURE) ×4 IMPLANT
SUT VIC AB 2-0 CTX 27 (SUTURE) IMPLANT
SUT VIC AB 3-0 SH 27 (SUTURE)
SUT VIC AB 3-0 SH 27X BRD (SUTURE) IMPLANT
SUT VIC AB 3-0 X1 27 (SUTURE) IMPLANT
SYR 10ML LL (SYRINGE) IMPLANT
SYR 30ML LL (SYRINGE) ×6 IMPLANT
SYR 3ML LL SCALE MARK (SYRINGE) ×6 IMPLANT
SYR 50ML SLIP (SYRINGE) IMPLANT
SYSTEM SAHARA CHEST DRAIN ATS (WOUND CARE) ×6 IMPLANT
TIP DUAL SPRAY TOPICAL (TIP) ×6 IMPLANT
TOWEL GREEN STERILE (TOWEL DISPOSABLE) ×6 IMPLANT
TOWEL GREEN STERILE FF (TOWEL DISPOSABLE) ×6 IMPLANT
TRAY FOLEY SLVR 16FR TEMP STAT (SET/KITS/TRAYS/PACK) ×6 IMPLANT
TUBING LAP HI FLOW INSUFFLATIO (TUBING) ×6 IMPLANT
UNDERPAD 30X36 HEAVY ABSORB (UNDERPADS AND DIAPERS) ×6 IMPLANT
WATER STERILE IRR 1000ML POUR (IV SOLUTION) ×12 IMPLANT
WATER STERILE IRR 1000ML UROMA (IV SOLUTION) IMPLANT

## 2020-04-22 NOTE — Consult Note (Signed)
[address]       [place] [address]             [phone]        Virginia Delgado [place] Medical Record #[id number] Date of Birth: Aug 15, 1945  Referring: No ref. provider found Primary Care: Glendale Chard, MD Primary Cardiologist:No primary care provider on file.  Chief Complaint:   No chief complaint on file. SSCP  History of Present Illness:      75 yo lady with HTN and known, previously asx, coronary Ca2+ who recently began experiencing CP. Has had relative intolerance to statins. She has maintained an active lifestyle. Given known coronary calcification, she underwent elective LHC showing severe multivessel CAD. Referred for CABG. Now pain free. Denies SOB but has had palpitations in the past without documented Afib.   Current Activity/ Functional Status: Patient will be independent with mobility/ambulation, transfers, ADL's, IADL's.   Zubrod Score: At the time of surgery this patient's most appropriate activity status/level should be described as: []     0    Normal activity, no symptoms [x]     1    Restricted in physical strenuous activity but ambulatory, able to do out light work []     2    Ambulatory and capable of self care, unable to do work activities, up and about                 more than 50%  Of the time                            []     3    Only limited self care, in bed greater than 50% of waking hours []     4    Completely disabled, no self care, confined to bed or chair []     5    Moribund  Past Medical History:  Diagnosis Date  . Coronary artery calcification 04/05/2019   Calcium score 93rd percentile 08/2018.  Marland Kitchen Dermatitis   . Hx of hepatitis C   . Hypertension     Past Surgical History:  Procedure Laterality Date  . BUNIONECTOMY Bilateral   . CESAREAN SECTION  1976  . CESAREAN SECTION  1979  . LEFT HEART CATH AND CORONARY ANGIOGRAPHY N/A 04/20/2020   Procedure: LEFT HEART CATH AND CORONARY ANGIOGRAPHY;  Surgeon: Nelva Bush, MD;  Location: [facility];  Service: Cardiovascular;  Laterality: N/A;  . UMBILICAL HERNIA REPAIR  1981    Social History   Tobacco Use  Smoking Status Never Smoker  Smokeless Tobacco Never Used    Social History   Substance and Sexual Activity  Alcohol Use Yes   Comment: social     Allergies  Allergen Reactions  . Contrast Media [Iodinated Diagnostic Agents] Swelling    Of face, especially eyes  . Gadobutrol Other (See Comments)    Felt extremities go numb briefly as contrast injected.  Had cough (bronchospasm?) briefly after scan.  Resolved on own without treatment.  07/13/15  . Gadolinium Derivatives Other (See Comments)    Felt extremities go numb briefly as contrast injected.  Had cough (bronchospasm?) briefly after scan.  Resolved on own without treatment.  07/13/15  . Lidocaine Other (See Comments)    Facial swelling after dental procedure  . Pravachol [Pravastatin Sodium]   . Latex Rash and Other (See Comments)    and irritation    Current  Facility-Administered Medications  Medication Dose Route Frequency Provider Last Rate Last Admin  . [MAR Hold] 0.9 %  sodium chloride infusion  250 mL Intravenous PRN End, Harrell Gave, MD      . Doug Sou Hold] acetaminophen (TYLENOL) tablet 650 mg  650 mg Oral Q4H PRN End, Harrell Gave, MD   650 mg at 04/21/20 0827  . [MAR Hold] amLODipine (NORVASC) tablet 10 mg  10 mg Oral Daily End, Christopher, MD   10 mg at 04/21/20 0827  . [MAR Hold] aspirin EC tablet 81 mg  81 mg Oral QHS End, Christopher, MD   81 mg at 04/21/20 2146  . bupivacaine liposome (EXPAREL) 1.3 % injection 266 mg  20 mL Infiltration To OR Atkins, Glenice Bow, MD      . Doug Sou Hold] carvedilol (COREG) tablet 3.125 mg  3.125 mg Oral BID WC Kathyrn Drown D, NP   3.125 mg at 04/21/20 1742  . cefUROXime (ZINACEF) 1.5 g in sodium chloride 0.9 % 100 mL IVPB  1.5 g Intravenous To OR Atkins, Broadus Z, MD      . cefUROXime (ZINACEF) 750 mg in sodium chloride 0.9 %  100 mL IVPB  750 mg Intravenous To OR Atkins, Glenice Bow, MD      . Doug Sou Hold] cholecalciferol (VITAMIN D3) tablet 4,000 Units  4,000 Units Oral Daily End, Christopher, MD   4,000 Units at 04/21/20 0981  . dexmedetomidine (PRECEDEX) 400 MCG/100ML (4 mcg/mL) infusion  0.1-0.7 mcg/kg/hr Intravenous To OR Atkins, Glenice Bow, MD      . Doug Sou Hold] enoxaparin (LOVENOX) injection 40 mg  40 mg Subcutaneous Q24H End, Christopher, MD   40 mg at 04/21/20 0829  . EPINEPHrine (ADRENALIN) 4 mg in NS 250 mL (0.016 mg/mL) premix infusion  0-10 mcg/min Intravenous To OR Atkins, Broadus Z, MD      . heparin 30,000 units/NS 1000 mL solution for CELLSAVER   Other To OR Atkins, Glenice Bow, MD      . heparin sodium (porcine) 2,500 Units, papaverine 30 mg in electrolyte-148 (PLASMALYTE-148) 500 mL irrigation   Irrigation To OR Atkins, Glenice Bow, MD      . Doug Sou Hold] losartan (COZAAR) tablet 50 mg  50 mg Oral Daily End, Christopher, MD   50 mg at 04/21/20 1914   And  . [MAR Hold] hydrochlorothiazide (MICROZIDE) capsule 12.5 mg  12.5 mg Oral Daily End, Christopher, MD   12.5 mg at 04/21/20 7829  . insulin regular, human (MYXREDLIN) 100 units/ 100 mL infusion   Intravenous To OR Atkins, Broadus Z, MD      . magnesium sulfate (IV Push/IM) injection 40 mEq  40 mEq Other To OR Atkins, Glenice Bow, MD      . milrinone (PRIMACOR) 20 MG/100 ML (0.2 mg/mL) infusion  0.3 mcg/kg/min Intravenous To OR Atkins, Glenice Bow, MD      . Doug Sou Hold] nitroGLYCERIN (NITROSTAT) SL tablet 0.4 mg  0.4 mg Sublingual Q5 min PRN End, Harrell Gave, MD      . nitroGLYCERIN 50 mg in dextrose 5 % 250 mL (0.2 mg/mL) infusion  2-200 mcg/min Intravenous To OR Atkins, Broadus Z, MD      . norepinephrine (LEVOPHED) 4mg  in 235mL premix infusion  0-40 mcg/min Intravenous To OR Atkins, Glenice Bow, MD      . Doug Sou Hold] ondansetron (ZOFRAN) injection 4 mg  4 mg Intravenous Q6H PRN End, Christopher, MD      . phenylephrine (NEOSYNEPHRINE) 20-0.9 MG/250ML-% infusion  30-200  mcg/min Intravenous To OR Atkins,  Glenice Bow, MD      . potassium chloride injection 80 mEq  80 mEq Other To OR Atkins, Glenice Bow, MD      . Doug Sou Hold] sodium chloride flush (NS) 0.9 % injection 3 mL  3 mL Intravenous Q12H End, Christopher, MD   3 mL at 04/21/20 2146  . [MAR Hold] sodium chloride flush (NS) 0.9 % injection 3 mL  3 mL Intravenous PRN End, Harrell Gave, MD      . temazepam (RESTORIL) capsule 15 mg  15 mg Oral Once PRN Atkins, Glenice Bow, MD      . tranexamic acid (CYKLOKAPRON) 2,500 mg in sodium chloride 0.9 % 250 mL (10 mg/mL) infusion  1.5 mg/kg/hr Intravenous To OR Atkins, Broadus Z, MD      . tranexamic acid (CYKLOKAPRON) bolus via infusion - over 30 minutes 903 mg  15 mg/kg Intravenous To OR Atkins, Broadus Z, MD      . tranexamic acid (CYKLOKAPRON) pump prime solution 120 mg  2 mg/kg Intracatheter To OR Atkins, Broadus Z, MD      . vancomycin (VANCOREADY) IVPB 1250 mg/250 mL  1,250 mg Intravenous To OR Atkins, Glenice Bow, MD       Facility-Administered Medications Ordered in Other Encounters  Medication Dose Route Frequency Provider Last Rate Last Admin  . fentaNYL citrate (PF) (SUBLIMAZE) injection   Intravenous Anesthesia Intra-op Candis Shine, CRNA   50 mcg at 04/22/20 [phone]  . lactated ringers infusion   Intravenous Continuous PRN Candis Shine, CRNA   New Bag at 04/22/20 0630  . midazolam (VERSED) 5 MG/5ML injection   Intravenous Anesthesia Intra-op Candis Shine, CRNA   1 mg at 04/22/20 1017    Medications Prior to Admission  Medication Sig Dispense Refill Last Dose  . amLODipine (NORVASC) 10 MG tablet TAKE 1 TABLET BY MOUTH EVERY DAY (Patient taking differently: Take 10 mg by mouth daily.) 90 tablet 0 04/20/2020 at 0800  . aspirin 81 MG tablet Take 81 mg by mouth at bedtime.   04/19/2020 at Unknown time  . Cholecalciferol (VITAMIN D) 50 MCG (2000 UT) tablet Take 4,000 Units by mouth daily.   04/19/2020 at Unknown time  . diclofenac Sodium (VOLTAREN) 1 % GEL Apply  2-4 grams to affected joint up to 4 times daily as needed (Patient taking differently: Apply 2-4 g topically 4 (four) times daily as needed (pain). Apply 2-4 grams to affected joint up to 4 times daily as needed) 50 g 1 Past Month at Unknown time  . losartan-hydrochlorothiazide (HYZAAR) 50-12.5 MG tablet TAKE 1 TABLET BY MOUTH EVERY DAY (Patient taking differently: Take 1 tablet by mouth daily.) 90 tablet 3 04/19/2020 at Unknown time  . Multiple Minerals-Vitamins (GNP CAL MAG ZINC +D3 PO) Take 1 tablet by mouth in the morning and at bedtime.   04/19/2020 at Unknown time  . nitroGLYCERIN (NITROSTAT) 0.4 MG SL tablet Place 1 tablet (0.4 mg total) under the tongue every 5 (five) minutes as needed for chest pain. 25 tablet PRN   . NONI, MORINDA CITRIFOLIA, PO Take 600 mg by mouth 2 (two) times a week.   04/19/2020 at Unknown time  . Olopatadine HCl 0.2 % SOLN Place 1 drop into both eyes daily as needed (itchy eyes).   Past Week at Unknown time  . OVER THE COUNTER MEDICATION Take 1 capsule by mouth in the morning and at bedtime. [place]   04/19/2020 at Unknown time  . predniSONE (DELTASONE) 50 MG tablet TAKE 1  TABLET 13 HOURS, 7 HOURS, AND PRIOR TO LEAVING FOR PROCEDURE 3 tablet 0 04/20/2020 at 0800    Family History  Problem Relation Age of Onset  . Cancer Mother        breast  . COPD Mother   . Heart disease Father   . Cancer Sister        sarcoma  . Hypertension Brother   . Ovarian cancer Maternal Grandmother   . Rheum arthritis Paternal Grandmother   . Hypertension Sister      Review of Systems:   ROS Pertinent items noted in HPI and remainder of comprehensive ROS otherwise negative.     Cardiac Review of Systems: Y or  [    ]= no  Chest Pain [    ]  Resting SOB [   ] Exertional SOB  [  ]  Orthopnea [  ]   Pedal Edema [   ]    Palpitations [  ] Syncope  [  ]   Presyncope [   ]  General Review of Systems: [Y] = yes [  ]=no Constitional: recent weight change [  ]; anorexia [  ]; fatigue [   ]; nausea [  ]; night sweats [  ]; fever [  ]; or chills [  ]                                                               Dental: Last Dentist visit:   Eye : blurred vision [  ]; diplopia [   ]; vision changes [  ];  Amaurosis fugax[  ]; Resp: cough [  ];  wheezing[  ];  hemoptysis[  ]; shortness of breath[  ]; paroxysmal nocturnal dyspnea[  ]; dyspnea on exertion[  ]; or orthopnea[  ];  GI:  gallstones[  ], vomiting[  ];  dysphagia[  ]; melena[  ];  hematochezia [  ]; heartburn[  ];   Hx of  Colonoscopy[  ]; GU: kidney stones [  ]; hematuria[  ];   dysuria [  ];  nocturia[  ];  history of     obstruction [  ]; urinary frequency [  ]             Skin: rash, swelling[  ];, hair loss[  ];  peripheral edema[  ];  or itching[  ]; Musculosketetal: myalgias[  ];  joint swelling[  ];  joint erythema[  ];  joint pain[  ];  back pain[  ];  Heme/Lymph: bruising[  ];  bleeding[  ];  anemia[  ];  Neuro: TIA[  ];  headaches[  ];  stroke[  ];  vertigo[  ];  seizures[  ];   paresthesias[  ];  difficulty walking[  ];  Psych:depression[  ]; anxiety[  ];  Endocrine: diabetes[  ];  thyroid dysfunction[  ];               Physical Exam: BP 105/63 (BP Location: Right Arm)   Pulse 68   Temp 98.7 F (37.1 C) (Oral)   Resp 19   Ht 5' 0.25" (1.53 m)   Wt 60.2 kg   SpO2 100%   BMI 25.70 kg/m    General appearance: alert and cooperative Neck: no adenopathy, no  carotid bruit, no JVD, supple, symmetrical, trachea midline and thyroid not enlarged, symmetric, no tenderness/mass/nodules Resp: clear to auscultation bilaterally Cardio: regular rate and rhythm, S1, S2 normal, no murmur, click, rub or gallop GI: soft, non-tender; bowel sounds normal; no masses,  no organomegaly Extremities: extremities normal, atraumatic, no cyanosis or edema Neurologic: Alert and oriented X 3, normal strength and tone. Normal symmetric reflexes. Normal coordination and gait  Diagnostic Studies & Laboratory data:     Recent  Radiology Findings:   DG Chest 2 View  Result Date: 04/21/2020 CLINICAL DATA:  Preop evaluation in a 75 year old female EXAM: CHEST - 2 VIEW COMPARISON:  May 10, 2018 FINDINGS: Trachea midline. Cardiomediastinal contours and hilar structures are normal. Lungs are clear.  No effusion. On limited assessment no acute skeletal process. IMPRESSION: No acute cardiopulmonary disease. Electronically Signed   By: Zetta Bills M.D.   On: 04/21/2020 22:10   CARDIAC CATHETERIZATION  Addendum Date: 04/20/2020   Conclusions: 1. Severe three-vessel coronary artery disease, including 80-90% ostial/proximal LAD stenosis, 70-90% lesions of ostial/proximal D1, D2, and ramus intermedius, and diffuse mid/distal LCx disease of up to 80% (dominant LCx).  90% stenosis of nondominant RCA is also present. 2. Normal left ventricular contraction with mildly elevated filling pressure. Recommendations: 1. Given significant three-vessel disease, including ostial LAD involvement, recommend cardiac surgery consultation for CABG.  Given rapid progression of symptoms (often present at rest), patient will be admitted for inpatient surgical evaluation. 2. Aggressive secondary prevention, including addition of statin. 3. Obtain echocardiogram. Nelva Bush, MD [facility]   Result Date: 04/20/2020 Conclusions: 1. Severe three-vessel coronary artery disease, including 80-90% ostial/proximal LAD stenosis, 70-90% lesions of ostial/proximal D1, D2, and ramus intermedius, and diffuse mid/distal LCx disease of up to 80% (dominant LCx).  90% stenosis of nondominant RCA is also present. 2. Normal left ventricular contraction with mildly elevated filling pressure. Recommendations: 1. Given significant three-vessel disease, including ostial LAD involvement, recommend cardiac surgery consultation for CABG.  Given rapid progression of symptoms (often present at rest), patient will be admitted for inpatient surgical evaluation. 2. Aggressive  secondary prevention, including addition of statin. 3. Obtain echocardiogram. Nelva Bush, MD [facility]   ECHOCARDIOGRAM COMPLETE  Result Date: 04/20/2020    ECHOCARDIOGRAM REPORT   Patient Name:   Virginia Delgado Date of Exam: 04/20/2020 Medical Rec #:  [id number]  Height:       60.2 in Accession #:    [id number] Weight:       127.0 lb Date of Birth:  1945-09-28 BSA:          1.543 m Patient Age:    89 years   BP:           133/76 mmHg Patient Gender: F          HR:           70 bpm. Exam Location:  Inpatient Procedure: 2D Echo Indications:    chest pain  History:        Patient has prior history of Echocardiogram examinations, most                 recent 09/21/2004.  Sonographer:    Johny Chess Referring Phys: [place]  1. Left ventricular ejection fraction, by estimation, is 65 to 70%. The left ventricle has normal function. The left ventricle has no regional wall motion abnormalities. Left ventricular diastolic parameters are indeterminate.  2. Right ventricular systolic function is normal. The right ventricular size is normal.  There is normal pulmonary artery systolic pressure.  3. The mitral valve is normal in structure. Mild mitral valve regurgitation.  4. The aortic valve is tricuspid. Aortic valve regurgitation is mild. Mild to moderate aortic valve sclerosis/calcification is present, without any evidence of aortic stenosis.  5. The inferior vena cava is normal in size with greater than 50% respiratory variability, suggesting right atrial pressure of 3 mmHg. FINDINGS  Left Ventricle: Left ventricular ejection fraction, by estimation, is 65 to 70%. The left ventricle has normal function. The left ventricle has no regional wall motion abnormalities. The left ventricular internal cavity size was normal in size. There is  no left ventricular hypertrophy. Left ventricular diastolic parameters are indeterminate. Right Ventricle: The right ventricular size is normal. Right  vetricular wall thickness was not assessed. Right ventricular systolic function is normal. There is normal pulmonary artery systolic pressure. The tricuspid regurgitant velocity is 1.95 m/s, and with an assumed right atrial pressure of 3 mmHg, the estimated right ventricular systolic pressure is 31.5 mmHg. Left Atrium: Left atrial size was normal in size. Right Atrium: Right atrial size was normal in size. Pericardium: There is no evidence of pericardial effusion. Mitral Valve: The mitral valve is normal in structure. Mild mitral valve regurgitation. Tricuspid Valve: The tricuspid valve is normal in structure. Tricuspid valve regurgitation is trivial. Aortic Valve: The aortic valve is tricuspid. Aortic valve regurgitation is mild. Aortic regurgitation PHT measures 513 msec. Mild to moderate aortic valve sclerosis/calcification is present, without any evidence of aortic stenosis. Pulmonic Valve: The pulmonic valve was normal in structure. Pulmonic valve regurgitation is trivial. Aorta: The aortic root and ascending aorta are structurally normal, with no evidence of dilitation. Venous: The inferior vena cava is normal in size with greater than 50% respiratory variability, suggesting right atrial pressure of 3 mmHg. IAS/Shunts: No atrial level shunt detected by color flow Doppler.  LEFT VENTRICLE PLAX 2D LVIDd:         4.00 cm  Diastology LVIDs:         2.40 cm  LV e' medial:    7.18 cm/s LV PW:         0.80 cm  LV E/e' medial:  10.6 LV IVS:        0.80 cm  LV e' lateral:   9.90 cm/s LVOT diam:     1.60 cm  LV E/e' lateral: 7.7 LV SV:         44 LV SV Index:   28 LVOT Area:     2.01 cm  RIGHT VENTRICLE             IVC RV S prime:     16.60 cm/s  IVC diam: 1.30 cm TAPSE (M-mode): 2.5 cm LEFT ATRIUM             Index       RIGHT ATRIUM           Index LA diam:        3.50 cm 2.27 cm/m  RA Area:     10.40 cm LA Vol (A2C):   39.6 ml 25.66 ml/m RA Volume:   20.30 ml  13.15 ml/m LA Vol (A4C):   36.8 ml 23.84 ml/m LA  Biplane Vol: 39.3 ml 25.46 ml/m  AORTIC VALVE LVOT Vmax:   110.00 cm/s LVOT Vmean:  67.400 cm/s LVOT VTI:    0.218 m AI PHT:      513 msec  AORTA Ao Root diam: 2.40 cm Ao Asc diam:  2.70 cm MITRAL VALVE               TRICUSPID VALVE MV Area (PHT): 3.65 cm    TR Peak grad:   15.2 mmHg MV Decel Time: 208 msec    TR Vmax:        195.00 cm/s MV E velocity: 75.80 cm/s MV A velocity: 86.50 cm/s  SHUNTS MV E/A ratio:  0.88        Systemic VTI:  0.22 m                            Systemic Diam: 1.60 cm Dorris Carnes MD Electronically signed by Dorris Carnes MD Signature Date/Time: 04/20/2020/7:43:20 PM    Final    VAS US DOPPLER PRE CABG  Result Date: 04/21/2020 PREOPERATIVE VASCULAR EVALUATION  Indications:      Pre-CABG. Risk Factors:     Hypertension, coronary artery disease. Comparison Study: no prior Performing Technologist: Abram Sander RVS  Examination Guidelines: A complete evaluation includes B-mode imaging, spectral Doppler, color Doppler, and power Doppler as needed of all accessible portions of each vessel. Bilateral testing is considered an integral part of a complete examination. Limited examinations for reoccurring indications may be performed as noted.  Right Carotid Findings: +----------+--------+--------+--------+------------+--------+           PSV cm/sEDV cm/sStenosisDescribe    Comments +----------+--------+--------+--------+------------+--------+ CCA Prox  78      10              heterogenous         +----------+--------+--------+--------+------------+--------+ CCA Distal47      13              heterogenous         +----------+--------+--------+--------+------------+--------+ ICA Prox  58      15      1-39%   heterogenous         +----------+--------+--------+--------+------------+--------+ ICA Distal58      19                                   +----------+--------+--------+--------+------------+--------+ ECA       55                                            +----------+--------+--------+--------+------------+--------+ Portions of this table do not appear on this page. +----------+--------+-------+--------+------------+           PSV cm/sEDV cmsDescribeArm Pressure +----------+--------+-------+--------+------------+ Subclavian50                                  +----------+--------+-------+--------+------------+ +---------+--------+--+--------+--+---------+ VertebralPSV cm/s52EDV cm/s11Antegrade +---------+--------+--+--------+--+---------+ Left Carotid Findings: +----------+--------+--------+--------+------------+--------+           PSV cm/sEDV cm/sStenosisDescribe    Comments +----------+--------+--------+--------+------------+--------+ CCA Prox  90      15              heterogenous         +----------+--------+--------+--------+------------+--------+ CCA Distal53      17              heterogenous         +----------+--------+--------+--------+------------+--------+ ICA Prox  60      24      1-39%   heterogenous         +----------+--------+--------+--------+------------+--------+  ICA Distal67      24                                   +----------+--------+--------+--------+------------+--------+ ECA       62      10                                   +----------+--------+--------+--------+------------+--------+ +----------+--------+--------+--------+------------+ SubclavianPSV cm/sEDV cm/sDescribeArm Pressure +----------+--------+--------+--------+------------+           60                                   +----------+--------+--------+--------+------------+ +---------+--------+--------+--------------+ VertebralPSV cm/sEDV cm/sNot identified +---------+--------+--------+--------------+  ABI Findings: +--------+------------------+-----+---------+--------+ Right   Rt Pressure (mmHg)IndexWaveform Comment  +--------+------------------+-----+---------+--------+ Brachial                        triphasic         +--------+------------------+-----+---------+--------+ ATA                            triphasic         +--------+------------------+-----+---------+--------+ PTA                            triphasic         +--------+------------------+-----+---------+--------+ +--------+------------------+-----+---------+-------+ Left    Lt Pressure (mmHg)IndexWaveform Comment +--------+------------------+-----+---------+-------+ Brachial                       triphasic        +--------+------------------+-----+---------+-------+ ATA                            triphasic        +--------+------------------+-----+---------+-------+ PTA                            triphasic        +--------+------------------+-----+---------+-------+  Right Doppler Findings: +--------+--------+-----+---------+--------+ Site    PressureIndexDoppler  Comments +--------+--------+-----+---------+--------+ Brachial             triphasic         +--------+--------+-----+---------+--------+ Radial               biphasic          +--------+--------+-----+---------+--------+ Ulnar                biphasic          +--------+--------+-----+---------+--------+  Left Doppler Findings: +--------+--------+-----+---------+--------+ Site    PressureIndexDoppler  Comments +--------+--------+-----+---------+--------+ Brachial             triphasic         +--------+--------+-----+---------+--------+ Radial               biphasic          +--------+--------+-----+---------+--------+ Ulnar                biphasic          +--------+--------+-----+---------+--------+  Summary: Right Carotid: Velocities in the right ICA are consistent with a 1-39% stenosis. Left Carotid: Velocities in the left ICA are consistent with a 1-39% stenosis. Vertebrals: Right vertebral artery demonstrates  antegrade flow. Left vertebral             artery was not visualized. Right Upper  Extremity: Doppler waveforms remain within normal limits with right radial compression. Doppler waveforms remain within normal limits with right ulnar compression. Left Upper Extremity: Doppler waveforms remain within normal limits with left radial compression. Doppler waveforms decrease 50% with left ulnar compression.  Electronically signed by Deitra Mayo MD on 04/21/2020 at 10:41:07 AM.    Final      I have independently reviewed the above radiologic studies and discussed with the patient   Recent Lab Findings: Lab Results  Component Value Date   WBC 6.9 04/22/2020   HGB 14.0 04/22/2020   HCT 41.2 04/22/2020   PLT 222 04/22/2020   GLUCOSE 97 04/22/2020   CHOL 219 (H) 04/13/2020   TRIG 68 04/13/2020   HDL 82 04/13/2020   LDLCALC 125 (H) 04/13/2020   ALT 12 04/13/2020   AST 17 04/13/2020   NA 138 04/22/2020   K 3.4 (L) 04/22/2020   CL 104 04/22/2020   CREATININE 1.06 (H) 04/22/2020   BUN 16 04/22/2020   CO2 23 04/22/2020   TSH 1.290 04/17/2020   INR 1.0 04/21/2020   HGBA1C 5.5 04/21/2020      Assessment / Plan:      75 yo lady with multivessel CAD and progressive angina. Agree with recommendation for CABG as best therapy. Plan CABG on 04/22/20. She understands risks and benefits and wishes to proceed.     I  spent 30 minutes counseling the patient face to face.   Broadus Z. Orvan Seen, MD [phone] 04/22/2020 7:02 AM

## 2020-04-22 NOTE — Op Note (Signed)
CARDIOTHORACIC SURGERY OPERATIVE NOTE  Date of Procedure: 04/22/2020  Preoperative Diagnosis: Severe 3-vessel Coronary Artery Disease  Postoperative Diagnosis: Same with intra-op atrial fibrillation  Procedure:    Coronary Artery Bypass Grafting x 4   Left Internal Mammary Artery to Distal Left Anterior Descending Coronary Artery; pedicled RIMA Graft to left  Posterior Descending Coronary Artery; right radial artery graft to Obtuse Marginal Branch of Left Circumflex Coronary Artery and ramus intermedius coronary Artery as a sequenced graft Bilateral IMA harvesting Open right radial artery harvesting Completion graft surveillance with indocyanine green fluorescence imaging (SPY) Left atrial appendage clipping Surgeon: B. Murvin Natal, MD  Assistant: Leretha Pol, PA-C  Anesthesia: get  Operative Findings:  preserved left ventricular systolic function  good quality  internal mammary artery conduits  good quality radial artery conduit  Good quality target vessels for grafting    BRIEF CLINICAL NOTE AND INDICATIONS FOR SURGERY  75 yo lady with HTN and HL presented with increased SSCP. She had known coronary artery calcification and so underwent LHC with new sx, showing severe 3V CAD. Referred for CABG. She has been thoroughly evaluated and is considered a good candidate for surgery.    DETAILS OF THE OPERATIVE PROCEDURE  Preparation:  The patient is brought to the operating room on the above mentioned date and central monitoring was established by the anesthesia team including placement of Swan-Ganz catheter and radial arterial line. The patient is placed in the supine position on the operating table.  Intravenous antibiotics are administered. General endotracheal anesthesia is induced uneventfully. A Foley catheter is placed.  Baseline transesophageal echocardiogram was performed.  Findings were notable for preserved LV function and trivial Aortic valve insufficiency  The  patient's chest, abdomen, both groins, right upper extremity, and both lower extremities are prepared and draped in a sterile manner. A time out procedure is performed.   Surgical Approach and Conduit Harvest:  Attention is first turned to the right forearm where the right radial artery harvesting is undertaken in an open fashion.  Upon removal of the graft the forearm incision is closed in layers and dressed.  The arm is retucked at the side.  A median sternotomy incision was performed and the left internal mammary artery is dissected from the chest wall and prepared for bypass grafting. The left internal mammary artery is notably good quality conduit.  Next, attention is turned to the right hemithorax of the right internal mammary artery and its pedicle mobilized in standard fashion.  Prior to dividing the pedicle distally full dose heparin is given intravenously.  Following systemic heparinization, the internal mammary artery grafts were transected distally noted to have excellent flow.  Both were treated with a solution of papaverine.   Extracorporeal Cardiopulmonary Bypass and Myocardial Protection:  The pericardium is opened. The ascending aorta is nondiseased in appearance. The ascending aorta and the right atrium are cannulated for cardiopulmonary bypass.  Adequate heparinization is verified.   The entire pre-bypass portion of the operation was notable for stable hemodynamics.  Cardiopulmonary bypass was begun and the surface of the heart is inspected. Distal target vessels are selected for coronary artery bypass grafting. A cardioplegia cannula is placed in the ascending aorta.    The patient is allowed to cool passively to 34C systemic temperature.  The aortic cross clamp is applied and cold blood cardioplegia is delivered initially in an antegrade fashion through the aortic root.  Iced saline slush is applied for topical hypothermia.  The initial cardioplegic arrest is rapid with  early  diastolic arrest.  Repeat doses of cardioplegia are administered intermittently throughout the entire cross clamp portion of the operation through the aortic root and through subsequently placed vein grafts in order to maintain completely flat electrocardiogram.   Coronary Artery Bypass Grafting:   The  posterior descending branch of the right coronary artery was grafted using the pedicled right internal mammary artery graft in an end-to-side fashion.  At the site of distal anastomosis the target vessel was fair quality and measured approximately 1.5 mm in diameter. Anastomotic patency and runoff was confirmed with indocyanine green fluorescence imaging (SPY).  The mid marginal OM branch of the left circumflex coronary artery and the ramus intermedius coronary were grafted using the right radial artery graft in a sequenced fashion.  At the site of distal anastomosis the target vessels were good quality and measured approximately 1.5 mm in diameter.  The distal left anterior coronary artery was grafted with the left internal mammary artery in an end-to-side fashion.  At the site of distal anastomosis the target vessel was good quality and measured approximately 1.5 mm in diameter. Anastomotic patency and runoff was confirmed with indocyanine green fluorescence imaging (SPY).  The proximal radial artery graft anastomosis was placed directly to the ascending aorta prior to removal of the aortic cross clamp.  A hotshot dose of cardioplegia was given down the aortic root and the aortic cross-clamp was removed after de-airing procedures were performed   Procedure Completion:  All proximal and distal coronary anastomoses were inspected for hemostasis and appropriate graft orientation. Epicardial pacing wires are fixed to the right ventricular outflow tract and to the right atrial appendage. The patient is rewarmed to 37C temperature. The patient is weaned and disconnected from cardiopulmonary bypass.  The  patient's rhythm at separation from bypass was sinus bradycardia.  The patient was weaned from cardiopulmonary bypass without any inotropic support.  Followup transesophageal echocardiogram performed after separation from bypass revealed  no changes from the preoperative exam.  The aortic and venous cannula were removed uneventfully. Protamine was administered to reverse the anticoagulation. The mediastinum and pleural space were inspected for hemostasis and irrigated with saline solution. The mediastinum and both pleural spaces were drained using fluted chest tubes placed through separate stab incisions inferiorly.  The soft tissues anterior to the aorta were reapproximated loosely. The sternum is closed with double strength sternal wire. The soft tissues anterior to the sternum were closed in multiple layers and the skin is closed with a running subcuticular skin closure.  The post-bypass portion of the operation was notable for stable rhythm and hemodynamics.   Disposition:  The patient tolerated the procedure well and is transported to the surgical intensive care in stable condition. There are no intraoperative complications. All sponge instrument and needle counts are verified correct at completion of the operation.    Jayme Cloud, MD 04/22/2020 4:26 PM

## 2020-04-22 NOTE — Anesthesia Procedure Notes (Signed)
Procedure Name: Intubation Date/Time: 04/22/2020 7:47 AM Performed by: Candis Shine, CRNA Pre-anesthesia Checklist: Patient identified, Emergency Drugs available, Suction available and Patient being monitored Patient Re-evaluated:Patient Re-evaluated prior to induction Oxygen Delivery Method: Circle System Utilized Preoxygenation: Pre-oxygenation with 100% oxygen Induction Type: IV induction Ventilation: Mask ventilation without difficulty Laryngoscope Size: Mac and 3 Grade View: Grade I Tube type: Oral Tube size: 7.0 mm Number of attempts: 1 Airway Equipment and Method: Stylet Placement Confirmation: ETT inserted through vocal cords under direct vision,  positive ETCO2 and breath sounds checked- equal and bilateral Secured at: 21 cm Tube secured with: Tape Dental Injury: Teeth and Oropharynx as per pre-operative assessment

## 2020-04-22 NOTE — Anesthesia Postprocedure Evaluation (Signed)
Anesthesia Post Note  Patient: Virginia Delgado  Procedure(s) Performed: CORONARY ARTERY BYPASS GRAFTING (CABGX4). USING BILATERAL MAMMARIES (N/A Chest) RIGHT RADIAL ARTERY HARVESTING. (Right Arm Lower) TRANSESOPHAGEAL ECHOCARDIOGRAM (TEE) (N/A ) INDOCYANINE GREEN FLUORESCENCE IMAGING (ICG) (N/A ) CLIPPING OF  LEFT ATRIAL APPENDAGE USING ATRICURE ATRICLIPFLEX-V 35 (Left Chest)     Patient location during evaluation: SICU Anesthesia Type: General Level of consciousness: sedated Pain management: pain level controlled Vital Signs Assessment: post-procedure vital signs reviewed and stable Respiratory status: patient remains intubated per anesthesia plan Cardiovascular status: stable Postop Assessment: no apparent nausea or vomiting Anesthetic complications: no   No complications documented.  Last Vitals:  Vitals:   04/22/20 1700 04/22/20 1745  BP: 100/76   Pulse: 92   Resp: (!) 25   Temp: 36.6 C   SpO2: 96% 94%    Last Pain:  Vitals:   04/22/20 1300  TempSrc: Core  PainSc:                  Catalina Gravel

## 2020-04-22 NOTE — Hospital Course (Addendum)
History of Present Illness:  Virginia Delgado is a 75 yo lady with known history of HTN, Hyperlipidemia, Hepatitis C, and elevated coronary calcium score.  She developed complaints of chest pain recently.  This has been occurring almost daily.  It isn't associated with anything specifically, but occurs when laying down or when walking.  The patient also notices she hears a "pulsing" in her head when she lays down at night.  The patient has continued to walk for exercise and doesn't experience chest pain but she has been afraid to run.  She was evaluated by Dr. Oval Linsey who felt knowing her calcium score was elevated she should undergo cardiac catheterization.  The patient was agreeable to proceed.  The patient underwent catheterization on 04/20/20 and was found to have severe 3 vessel CAD.  She was admitted to the hospital for possible coronary bypass grafting.  Hospital Course:  The patient was evaluated by Dr. Orvan Seen who felt patient would be a good candidate for surgical revascularization.  The risks and benefits of the procedure were explained to the patient and she was agreeable to proceed.  She was taken to the operating room on 04/22/2020.  She underwent CABG x 4 utilizing LIMA to LAD, RIMA to Left PDA, Sequential Radial artery to OM and Ramus Intermediate. She also underwent clipping of her LA appendage and open right radial artery.  She tolerated the procedure without difficulty and was taken to the SICU in stable condition.  She was extubated the evening of surgery.  During her stay in the SICU the patient was weaned off IV NTG and transitioned to Isordil for her radial artery graft.  Her chest tubes and arterial line were removed without difficulty.  She was diuresed for volume overload state.  She was maintaining NSR and felt stable for transfer to the progressive care unit on 04/25/2020.  The patient continued to make good progress.  She remained in NSR with occasional PVC's.  Her pacing wires were removed  without difficulty.  She requested a rolling walker for discharge which was arranged.  She is ambulating independently.  Her surgical incisions are healing without evidence of infection.  She is felt to be medically stable for discharge home today.

## 2020-04-22 NOTE — Anesthesia Procedure Notes (Signed)
Central Venous Catheter Insertion Performed by: Catalina Gravel, MD, anesthesiologist Start/End2/23/2022 6:45 AM, 04/22/2020 6:55 AM Patient location: Pre-op. Preanesthetic checklist: patient identified, IV checked, site marked, risks and benefits discussed, surgical consent, monitors and equipment checked, pre-op evaluation, timeout performed and anesthesia consent Position: Trendelenburg Lidocaine 1% used for infiltration and patient sedated Hand hygiene performed , maximum sterile barriers used  and Seldinger technique used Catheter size: 9 Fr Central line was placed.MAC introducer Procedure performed using ultrasound guided technique. Ultrasound Notes:anatomy identified, needle tip was noted to be adjacent to the nerve/plexus identified, no ultrasound evidence of intravascular and/or intraneural injection and image(s) printed for medical record Attempts: 1 Following insertion, line sutured, dressing applied and Biopatch. Post procedure assessment: free fluid flow, blood return through all ports and no air  Patient tolerated the procedure well with no immediate complications.

## 2020-04-22 NOTE — H&P (Signed)
History and Physical Interval Note:  04/22/2020 7:12 AM  Virginia Delgado  has presented today for surgery, with the diagnosis of CAD.  The various methods of treatment have been discussed with the patient and family. After consideration of risks, benefits and other options for treatment, the patient has consented to  Procedure(s) with comments: CORONARY ARTERY BYPASS GRAFTING (CABG) (N/A) - BIMA possible RADIAL ARTERY HARVEST (Right) TRANSESOPHAGEAL ECHOCARDIOGRAM (TEE) (N/A) INDOCYANINE GREEN FLUORESCENCE IMAGING (ICG) (N/A) as a surgical intervention.  The patient's history has been reviewed, patient examined, no change in status, stable for surgery.  I have reviewed the patient's chart and labs.  Questions were answered to the patient's satisfaction.     Wonda Olds

## 2020-04-22 NOTE — Anesthesia Procedure Notes (Signed)
Arterial Line Insertion Start/End2/23/2022 6:40 AM, 04/22/2020 6:45 AM Performed by: CRNA  Patient location: Pre-op. Preanesthetic checklist: patient identified, IV checked, site marked, risks and benefits discussed, surgical consent, monitors and equipment checked, pre-op evaluation, timeout performed and anesthesia consent Lidocaine 1% used for infiltration Left, radial was placed Catheter size: 20 Fr Hand hygiene performed  and maximum sterile barriers used   Attempts: 1 Procedure performed without using ultrasound guided technique. Following insertion, dressing applied and Biopatch. Post procedure assessment: normal and unchanged  Patient tolerated the procedure well with no immediate complications. Additional procedure comments: Performed by Marguarite Arbour Rickerts, SRNA.

## 2020-04-22 NOTE — Progress Notes (Signed)
RN paged Dr. Orvan Seen in regards to pt weaning parameters off ventilator. Pt met all criteria except her vital capacity was only 600. Per Dr. Orvan Seen, pt is okay to be extubated.

## 2020-04-22 NOTE — Transfer of Care (Signed)
Immediate Anesthesia Transfer of Care Note  Patient: Virginia Delgado  Procedure(s) Performed: CORONARY ARTERY BYPASS GRAFTING (CABGX4). USING BILATERAL MAMMARIES (N/A Chest) RIGHT RADIAL ARTERY HARVESTING. (Right Arm Lower) TRANSESOPHAGEAL ECHOCARDIOGRAM (TEE) (N/A ) INDOCYANINE GREEN FLUORESCENCE IMAGING (ICG) (N/A ) CLIPPING OF  LEFT ATRIAL APPENDAGE USING ATRICURE ATRICLIPFLEX-V 35 (Left Chest)  Patient Location: SICU  Anesthesia Type:General  Level of Consciousness: Patient remains intubated per anesthesia plan  Airway & Oxygen Therapy: Patient remains intubated per anesthesia plan and Patient placed on Ventilator (see vital sign flow sheet for setting)  Post-op Assessment: Report given to RN and Post -op Vital signs reviewed and stable  Post vital signs: Reviewed and stable  Last Vitals:  Vitals Value Taken Time  BP    Temp    Pulse 80 04/22/20 1245  Resp 48 04/22/20 1245  SpO2 96 % 04/22/20 1245  Vitals shown include unvalidated device data.  Last Pain:  Vitals:   04/22/20 0446  TempSrc: Oral  PainSc:          Complications: No complications documented.

## 2020-04-22 NOTE — Procedures (Signed)
Extubation Procedure Note  Patient Details:   Name: Virginia Delgado DOB: [birth year] MRN: [id number]   Airway Documentation:    Vent end date: 04/22/20 Vent end time: 1745   Evaluation  O2 sats: stable throughout Complications: No apparent complications Patient did tolerate procedure well. Bilateral Breath Sounds: Clear,Diminished   Yes   Pt was extubated per rapid wean protocol without complications. NIF was -20 and VC 600 (Dr. Julien Girt notified) prior to extubation. Cuff leak was noted prior and no stridor post. Pt is stable at this time. RT will monitor.   Ronaldo Miyamoto 04/22/2020, 5:51 PM

## 2020-04-22 NOTE — Discharge Instructions (Signed)

## 2020-04-22 NOTE — Anesthesia Procedure Notes (Signed)
Central Venous Catheter Insertion Performed by: Catalina Gravel, MD, anesthesiologist Start/End2/23/2022 6:55 AM, 04/22/2020 7:00 AM Patient location: Pre-op. Preanesthetic checklist: patient identified, IV checked, site marked, risks and benefits discussed, surgical consent, monitors and equipment checked, pre-op evaluation and timeout performed Position: Trendelenburg Hand hygiene performed  and maximum sterile barriers used  Total catheter length 100. PA cath was placed.Swan type:thermodilution PA Cath depth:43 Procedure performed without using ultrasound guided technique. Attempts: 1 Patient tolerated the procedure well with no immediate complications.

## 2020-04-22 NOTE — Progress Notes (Signed)
  Echocardiogram Echocardiogram Transesophageal has been performed.  Virginia Delgado 04/22/2020, 8:50 AM

## 2020-04-22 NOTE — Brief Op Note (Signed)
04/20/2020 - 04/22/2020  11:26 AM  PATIENT:  Virginia Delgado  75 y.o. female  PRE-OPERATIVE DIAGNOSIS:  CAD  POST-OPERATIVE DIAGNOSIS:  CAD  PROCEDURE:  Procedure(s) with comments:  CORONARY ARTERY BYPASS GRAFTING x 4 -LIMA to LAD -RADIAL ARTERY to RAMUS INTERMEDIATE and OM -RIMA to LEFT PDA  RIGHT RADIAL ARTERY HARVESTING 25 min/5 min  CLIPPING OF ATRIAL APPENDAGE USING ATRICURE ATRICLIPFLEX-V 35  TRANSESOPHAGEAL ECHOCARDIOGRAM (TEE) (N/A)  INDOCYANINE GREEN FLUORESCENCE IMAGING (ICG) (N/A)   SURGEON:  Surgeon(s) and Role:    * Wonda Olds, MD - Primary  PHYSICIAN ASSISTANT: Ellwood Handler PA-C  ANESTHESIA:   general  EBL:  Per anes record   BLOOD ADMINISTERED:1 unit PRBC and  CELLSAVER  DRAINS: Left and Right Pleural Chest Tubes, Mediastinal Chest Drains   LOCAL MEDICATIONS USED:  NONE  SPECIMEN:  No Specimen  DISPOSITION OF SPECIMEN:  N/A  COUNTS:  YES  TOURNIQUET:  * Missing tourniquet times found for documented tourniquets in log: [id number] *  DICTATION: .Dragon Dictation  PLAN OF CARE: Admit to inpatient   PATIENT DISPOSITION:  ICU - intubated and hemodynamically stable.   Delay start of Pharmacological VTE agent (>24hrs) due to surgical blood loss or risk of bleeding: yes

## 2020-04-22 NOTE — Progress Notes (Signed)
Per Dr. Orvan Seen, RN is okay to wean patient off the ventilator regardless of low CO/CI. Verbal orders to "give enough volume as needed".

## 2020-04-23 ENCOUNTER — Encounter (HOSPITAL_COMMUNITY): Payer: Self-pay | Admitting: Cardiothoracic Surgery

## 2020-04-23 ENCOUNTER — Inpatient Hospital Stay (HOSPITAL_COMMUNITY): Payer: Medicare HMO

## 2020-04-23 DIAGNOSIS — I10 Essential (primary) hypertension: Secondary | ICD-10-CM | POA: Diagnosis not present

## 2020-04-23 DIAGNOSIS — E7800 Pure hypercholesterolemia, unspecified: Secondary | ICD-10-CM | POA: Diagnosis not present

## 2020-04-23 DIAGNOSIS — D62 Acute posthemorrhagic anemia: Secondary | ICD-10-CM | POA: Diagnosis not present

## 2020-04-23 DIAGNOSIS — I97791 Other intraoperative cardiac functional disturbances during other surgery: Secondary | ICD-10-CM | POA: Diagnosis not present

## 2020-04-23 DIAGNOSIS — I200 Unstable angina: Secondary | ICD-10-CM | POA: Diagnosis not present

## 2020-04-23 DIAGNOSIS — I25110 Atherosclerotic heart disease of native coronary artery with unstable angina pectoris: Secondary | ICD-10-CM | POA: Diagnosis not present

## 2020-04-23 LAB — BASIC METABOLIC PANEL
Anion gap: 12 (ref 5–15)
Anion gap: 6 (ref 5–15)
BUN: 10 mg/dL (ref 8–23)
BUN: 11 mg/dL (ref 8–23)
CO2: 19 mmol/L — ABNORMAL LOW (ref 22–32)
CO2: 24 mmol/L (ref 22–32)
Calcium: 8.8 mg/dL — ABNORMAL LOW (ref 8.9–10.3)
Calcium: 9 mg/dL (ref 8.9–10.3)
Chloride: 108 mmol/L (ref 98–111)
Chloride: 105 mmol/L (ref 98–111)
Creatinine, Ser: 0.86 mg/dL (ref 0.44–1.00)
Creatinine, Ser: 0.89 mg/dL (ref 0.44–1.00)
GFR, Estimated: >60 mL/min (ref >60)
GFR, Estimated: >60 mL/min (ref >60)
Glucose, Bld: 141 mg/dL — ABNORMAL HIGH (ref 70–99)
Glucose, Bld: 142 mg/dL — ABNORMAL HIGH (ref 70–99)
Potassium: 3.8 mmol/L (ref 3.5–5.1)
Potassium: 3.6 mmol/L (ref 3.5–5.1)
Sodium: 139 mmol/L (ref 135–145)
Sodium: 135 mmol/L (ref 135–145)

## 2020-04-23 LAB — TYPE AND SCREEN
ABO/RH(D): O POS
Antibody Screen: NEGATIVE
Unit division: 0
Unit division: 0

## 2020-04-23 LAB — GLUCOSE, CAPILLARY
Glucose-Capillary: 101 mg/dL — ABNORMAL HIGH (ref 70–99)
Glucose-Capillary: 103 mg/dL — ABNORMAL HIGH (ref 70–99)
Glucose-Capillary: 104 mg/dL — ABNORMAL HIGH (ref 70–99)
Glucose-Capillary: 104 mg/dL — ABNORMAL HIGH (ref 70–99)
Glucose-Capillary: 112 mg/dL — ABNORMAL HIGH (ref 70–99)
Glucose-Capillary: 127 mg/dL — ABNORMAL HIGH (ref 70–99)
Glucose-Capillary: 133 mg/dL — ABNORMAL HIGH (ref 70–99)
Glucose-Capillary: 135 mg/dL — ABNORMAL HIGH (ref 70–99)
Glucose-Capillary: 149 mg/dL — ABNORMAL HIGH (ref 70–99)
Glucose-Capillary: 81 mg/dL (ref 70–99)

## 2020-04-23 LAB — BPAM RBC
Blood Product Expiration Date: 202203222359
Blood Product Expiration Date: 202203222359
ISSUE DATE / TIME: 202202231032
ISSUE DATE / TIME: 202202231032
Unit Type and Rh: 5100
Unit Type and Rh: 5100

## 2020-04-23 LAB — CBC
HCT: 34.4 % — ABNORMAL LOW (ref 36.0–46.0)
HCT: 29.8 % — ABNORMAL LOW (ref 36.0–46.0)
Hemoglobin: 11.7 g/dL — ABNORMAL LOW (ref 12.0–15.0)
Hemoglobin: 10.3 g/dL — ABNORMAL LOW (ref 12.0–15.0)
MCH: 30.2 pg (ref 26.0–34.0)
MCH: 30.7 pg (ref 26.0–34.0)
MCHC: 34 g/dL (ref 30.0–36.0)
MCHC: 34.6 g/dL (ref 30.0–36.0)
MCV: 88.7 fL (ref 80.0–100.0)
MCV: 88.7 fL (ref 80.0–100.0)
Platelets: 111 10*3/uL — ABNORMAL LOW (ref 150–400)
Platelets: 122 10*3/uL — ABNORMAL LOW (ref 150–400)
RBC: 3.88 MIL/uL (ref 3.87–5.11)
RBC: 3.36 MIL/uL — ABNORMAL LOW (ref 3.87–5.11)
RDW: 13.3 % (ref 11.5–15.5)
RDW: 13.5 % (ref 11.5–15.5)
WBC: 9.4 10*3/uL (ref 4.0–10.5)
WBC: 11 10*3/uL — ABNORMAL HIGH (ref 4.0–10.5)
nRBC: 0 % (ref 0.0–0.2)
nRBC: 0 % (ref 0.0–0.2)

## 2020-04-23 LAB — MAGNESIUM
Magnesium: 2.6 mg/dL — ABNORMAL HIGH (ref 1.7–2.4)
Magnesium: 2.3 mg/dL (ref 1.7–2.4)

## 2020-04-23 MED ORDER — KETOROLAC TROMETHAMINE 15 MG/ML IJ SOLN
7.5000 mg | Freq: Four times a day (QID) | INTRAMUSCULAR | Status: AC
  Administered 2020-04-23 – 2020-04-24 (×5): 7.5 mg via INTRAVENOUS
  Filled 2020-04-23 (×5): qty 1

## 2020-04-23 MED ORDER — COLCHICINE 0.3 MG HALF TABLET
0.3000 mg | ORAL_TABLET | Freq: Two times a day (BID) | ORAL | Status: DC
  Administered 2020-04-23 – 2020-04-27 (×8): 0.3 mg via ORAL
  Filled 2020-04-23 (×10): qty 1

## 2020-04-23 MED ORDER — ROSUVASTATIN CALCIUM 5 MG PO TABS
5.0000 mg | ORAL_TABLET | Freq: Every day | ORAL | Status: DC
  Administered 2020-04-23 – 2020-04-27 (×5): 5 mg via ORAL
  Filled 2020-04-23 (×5): qty 1

## 2020-04-23 MED ORDER — INSULIN ASPART 100 UNIT/ML ~~LOC~~ SOLN
0.0000 [IU] | SUBCUTANEOUS | Status: DC
  Administered 2020-04-23 – 2020-04-25 (×5): 2 [IU] via SUBCUTANEOUS

## 2020-04-23 MED ORDER — ISOSORBIDE DINITRATE 10 MG PO TABS
10.0000 mg | ORAL_TABLET | Freq: Three times a day (TID) | ORAL | Status: DC
  Administered 2020-04-23 – 2020-04-27 (×13): 10 mg via ORAL
  Filled 2020-04-23 (×12): qty 1

## 2020-04-23 MED FILL — Potassium Chloride Inj 2 mEq/ML: INTRAVENOUS | Qty: 40 | Status: AC

## 2020-04-23 MED FILL — Magnesium Sulfate Inj 50%: INTRAMUSCULAR | Qty: 10 | Status: AC

## 2020-04-23 MED FILL — Heparin Sodium (Porcine) Inj 1000 Unit/ML: INTRAMUSCULAR | Qty: 30 | Status: AC

## 2020-04-23 NOTE — Progress Notes (Signed)
Progress Note  Patient Name: Virginia Delgado Date of Encounter: 04/23/2020  [facility] Cardiologist: Skeet Latch, MD   Subjective   Postop pain well-controlled.  Feeling tired but otherwise well.  Not excited, but agreeable with starting low-dose statin.  Inpatient Medications    Scheduled Meds: . acetaminophen  1,000 mg Oral Q6H   Or  . acetaminophen (TYLENOL) oral liquid 160 mg/5 mL  1,000 mg Per Tube Q6H  . aspirin EC  325 mg Oral Daily   Or  . aspirin  324 mg Per Tube Daily  . bisacodyl  10 mg Oral Daily   Or  . bisacodyl  10 mg Rectal Daily  . Chlorhexidine Gluconate Cloth  6 each Topical Daily  . Chlorhexidine Gluconate Cloth  6 each Topical Daily  . colchicine  0.3 mg Oral BID  . docusate sodium  200 mg Oral Daily  . insulin aspart  0-24 Units Subcutaneous Q4H  . isosorbide dinitrate  10 mg Oral TID  . ketorolac  7.5 mg Intravenous Q6H  . mouth rinse  15 mL Mouth Rinse BID  . metoprolol tartrate  12.5 mg Oral BID   Or  . metoprolol tartrate  12.5 mg Per Tube BID  . [START ON 04/24/2020] pantoprazole  40 mg Oral Daily  . sodium chloride flush  10-40 mL Intracatheter Q12H  . sodium chloride flush  3 mL Intravenous Q12H   Continuous Infusions: . sodium chloride 20 mL/hr at 04/23/20 0700  . sodium chloride    . sodium chloride    . cefUROXime (ZINACEF)  IV Stopped (04/23/20 0500)  . dexmedetomidine (PRECEDEX) IV infusion Stopped (04/22/20 1511)  . electrolyte-A 75 mL/hr at 04/23/20 0700  . lactated ringers    . lactated ringers Stopped (04/23/20 0551)  . nitroGLYCERIN 5 mcg/min (04/23/20 0700)  . phenylephrine (NEO-SYNEPHRINE) Adult infusion Stopped (04/23/20 0007)   PRN Meds: sodium chloride, metoprolol tartrate, midazolam, morphine injection, ondansetron (ZOFRAN) IV, oxyCODONE, sodium chloride flush, sodium chloride flush, traMADol   Vital Signs    Vitals:   04/23/20 0600 04/23/20 0620 04/23/20 0624 04/23/20 0700  BP:  112/81  103/78  Pulse: 93  92  73  Resp: (!) 27 (!) 26  (!) 28  Temp: (!) 97.5 F (36.4 C) 97.7 F (36.5 C)  97.7 F (36.5 C)  TempSrc:      SpO2: 97% 96%  97%  Weight:   61.4 kg   Height:        Intake/Output Summary (Last 24 hours) at 04/23/2020 0949 Last data filed at 04/23/2020 0700 Gross per 24 hour  Intake 5564.24 ml  Output 5570 ml  Net -5.76 ml   Last 3 Weights 04/23/2020 04/21/2020 04/20/2020  Weight (lbs) 135 lb 5.8 oz 132 lb 11.5 oz 127 lb  Weight (kg) 61.4 kg 60.2 kg 57.607 kg      Telemetry    AP VS- Personally Reviewed  ECG    04/23/2020: Sinus rhythm.  RSR prime in V1.  Anterolateral ST elevation.  LVH. - Personally Reviewed  Physical Exam   VS:  BP 103/78   Pulse 73   Temp 97.7 F (36.5 C)   Resp (!) 28   Ht 5' 0.25" (1.53 m)   Wt 61.4 kg   SpO2 97%   BMI 26.22 kg/m  , BMI Body mass index is 26.22 kg/m. GENERAL:  Well appearing HEENT: Pupils equal round and reactive, fundi not visualized, oral mucosa unremarkable NECK:  No jugular venous distention, waveform  within normal limits, carotid upstroke brisk and symmetric LUNGS:  Clear to auscultation bilaterally HEART:  RRR.  PMI not displaced or sustained,S1 and S2 within normal limits, no S3, no S4, no clicks, no rubs, no murmurs ABD:  Flat, positive bowel sounds normal in frequency in pitch, no bruits, no rebound, no guarding, no midline pulsatile mass, no hepatomegaly, no splenomegaly EXT:  2 plus pulses throughout, no edema, no cyanosis no clubbing SKIN:  No rashes no nodules NEURO:  Cranial nerves II through XII grossly intact, motor grossly intact throughout PSYCH:  Cognitively intact, oriented to person place and time   Labs    High Sensitivity Troponin:  No results for input(s): TROPONINIHS in the last 720 hours.    Chemistry Recent Labs  Lab 04/22/20 0544 04/22/20 0756 04/22/20 1143 04/22/20 1146 04/22/20 1830 04/22/20 1832 04/23/20 0436  NA 138   < > 141   < > 137 142 139  K 3.4*   < > 3.6   < > 4.0 4.2  3.8  CL 104   < > 110  --  109  --  108  CO2 23  --   --   --  20*  --  19*  GLUCOSE 97   < > 108*  --  170*  --  141*  BUN 16   < > 12  --  11  --  10  CREATININE 1.06*   < > 0.50  --  0.86  --  0.86  CALCIUM 10.5*  --   --   --  8.1*  --  8.8*  GFRNONAA 56*  --   --   --  >60  --  >60  ANIONGAP 11  --   --   --  8  --  12   < > = values in this interval not displayed.     Hematology Recent Labs  Lab 04/22/20 1234 04/22/20 1258 04/22/20 1830 04/22/20 1832 04/23/20 0436  WBC 9.2  --  11.1*  --  9.4  RBC 3.68*  --  3.95  --  3.88  HGB 11.3*   < > 11.9* 11.2* 11.7*  HCT 32.0*   < > 35.2* 33.0* 34.4*  MCV 87.0  --  89.1  --  88.7  MCH 30.7  --  30.1  --  30.2  MCHC 35.3  --  33.8  --  34.0  RDW 12.9  --  13.1  --  13.3  PLT 110*  --  118*  --  111*   < > = values in this interval not displayed.    BNPNo results for input(s): BNP, PROBNP in the last 168 hours.   DDimer No results for input(s): DDIMER in the last 168 hours.   Radiology    DG Chest 2 View  Result Date: 04/21/2020 CLINICAL DATA:  Preop evaluation in a 75 year old female EXAM: CHEST - 2 VIEW COMPARISON:  May 10, 2018 FINDINGS: Trachea midline. Cardiomediastinal contours and hilar structures are normal. Lungs are clear.  No effusion. On limited assessment no acute skeletal process. IMPRESSION: No acute cardiopulmonary disease. Electronically Signed   By: Zetta Bills M.D.   On: 04/21/2020 22:10   DG Chest Port 1 View  Result Date: 04/23/2020 CLINICAL DATA:  Chest tube.  Open-heart surgery.  Sore chest. EXAM: PORTABLE CHEST 1 VIEW COMPARISON:  04/22/2020. FINDINGS: Interim extubation and removal of NG tube. Swan-Ganz catheter, mediastinal drainage catheter, and bilateral chest tubes in stable position. Prior  CABG. Left atrial appendage clip in stable position. Heart size stable. Low lung volumes with mild bibasilar atelectasis, atelectasis has progressed from prior exam. Tiny bilateral pleural effusions. No  pneumothorax. IMPRESSION: 1. Interim extubation and removal of NG tube. Swan-Ganz catheter, mediastinal drainage catheter, bilateral chest tubes in stable position. No pneumothorax. 2. Prior CABG. Left atrial appendage clip in stable position. Heart size stable. 3. Low lung volumes with mild bibasilar atelectasis. Mild bibasilar atelectasis has progressed from prior exam. Tiny bilateral pleural effusions. Electronically Signed   By: Marcello Moores  Register   On: 04/23/2020 06:46   DG Chest Port 1 View  Result Date: 04/22/2020 CLINICAL DATA:  Patient status post CABG today. EXAM: PORTABLE CHEST 1 VIEW COMPARISON:  PA and lateral chest 04/21/2020. FINDINGS: Endotracheal tube tip is 1.2 cm above the carina. Right IJ approach Swan-Ganz catheter tip is just within the right main pulmonary artery. NG tube courses into the stomach and below the inferior margin of the film. Bilateral chest tubes and mediastinal drain are noted. Mild subsegmental atelectasis is seen lung bases. No pneumothorax or pleural fluid. No pulmonary edema. Heart size is upper normal. IMPRESSION: Support tubes and lines as described. Note is again made that the patient's ET tube is 1.2 cm above the carina. The tube could be withdrawn 1 cm for better positioning. No pneumothorax or acute disease. Electronically Signed   By: Inge Rise M.D.   On: 04/22/2020 13:24   ECHO INTRAOPERATIVE TEE  Result Date: 04/22/2020  *INTRAOPERATIVE TRANSESOPHAGEAL REPORT *  Patient Name:   QUINETTE HENTGES     Date of Exam: 04/22/2020 Medical Rec #:  [id number]      Height:       60.2 in Accession #:    [id number]     Weight:       132.7 lb Date of Birth:  [birth year]     BSA:          1.57 m Patient Age:    31 years       BP:           133/76 mmHg Patient Gender: F              HR:           69 bpm. Exam Location:  Anesthesiology Transesophogeal exam was perform intraoperatively during surgical procedure. Patient was closely monitored under general anesthesia during the  entirety of examination. Indications:     Coronary Artery Disease Sonographer:     Bernadene Person RDCS Performing Phys: [id number] Z ATKINS Diagnosing Phys: Hoy Morn MD Complications: No known complications during this procedure. POST-OP IMPRESSIONS - Left Ventricle: The left ventricle is unchanged from pre-bypass. The cavity size was normal. The wall motion is normal. - Right Ventricle: The right ventricle appears unchanged from pre-bypass. - Aorta: The aorta appears unchanged from pre-bypass. - Left Atrium: The left atrium appears unchanged from pre-bypass. - Left Atrial Appendage: The left atrial appendage appears unchanged from pre-bypass. - Aortic Valve: The aortic valve appears unchanged from pre-bypass. - Mitral Valve: The mitral valve appears unchanged from pre-bypass. - Tricuspid Valve: The tricuspid valve appears unchanged from pre-bypass. - Interatrial Septum: The interatrial septum appears unchanged from pre-bypass. - Interventricular Septum: The interventricular septum appears unchanged from pre-bypass. - Pericardium: The pericardium appears unchanged from pre-bypass. - Comments: Post-bypass images reviewed with surgeon. PRE-OP FINDINGS  Left Ventricle: The left ventricle has normal systolic function, with an ejection fraction of 60-65%. The cavity size was normal. There  is mildly increased left ventricular wall thickness. No evidence of left ventricular regional wall motion abnormalities. Right Ventricle: The right ventricle has normal systolic function. The cavity was normal. There is no increase in right ventricular wall thickness. Left Atrium: Left atrial size was normal in size. The left atrial appendage is well visualized and there is no evidence of thrombus present. Left atrial appendage velocity is normal at greater than 40 cm/s. Right Atrium: Right atrial size was normal in size. Interatrial Septum: No atrial level shunt detected by color flow Doppler. Pericardium: There is no evidence  of pericardial effusion. Mitral Valve: The mitral valve is normal in structure. Mitral valve regurgitation is trivial by color flow Doppler. There is No evidence of mitral stenosis. Tricuspid Valve: The tricuspid valve was normal in structure. Tricuspid valve regurgitation is trivial by color flow Doppler. Aortic Valve: The aortic valve is tricuspid There is mild thickening of the aortic valve and There is mild calcification of the aortic valve Aortic valve regurgitation is trivial by color flow Doppler. There is no evidence of aortic valve stenosis. Pulmonic Valve: The pulmonic valve was normal in structure. Pulmonic valve regurgitation is trivial by color flow Doppler. Aorta: The aortic root, ascending aorta and aortic arch are normal in size and structure. Pulmonary Artery: The pulmonary artery is of normal size.  Hoy Morn MD Electronically signed by Hoy Morn MD Signature Date/Time: 04/22/2020/5:54:26 PM    Final     Cardiac Studies   Echo 04/20/20: 1. Left ventricular ejection fraction, by estimation, is 65 to 70%. The  left ventricle has normal function. The left ventricle has no regional  wall motion abnormalities. Left ventricular diastolic parameters are  indeterminate.  2. Right ventricular systolic function is normal. The right ventricular  size is normal. There is normal pulmonary artery systolic pressure.  3. The mitral valve is normal in structure. Mild mitral valve  regurgitation.  4. The aortic valve is tricuspid. Aortic valve regurgitation is mild.  Mild to moderate aortic valve sclerosis/calcification is present, without  any evidence of aortic stenosis.  5. The inferior vena cava is normal in size with greater than 50%  respiratory variability, suggesting right atrial pressure of 3 mmHg.   LHC 04/20/20: Conclusions: 1. Severe three-vessel coronary artery disease, including 80-90% ostial/proximal LAD stenosis, 70-90% lesions of ostial/proximal D1, D2, and ramus  intermedius, and diffuse mid/distal LCx disease of up to 80% (dominant LCx).  90% stenosis of nondominant RCA is also present. 2. Normal left ventricular contraction with mildly elevated filling pressure.   Patient Profile     75 y.o. female with CAD, mild carotid stenosis, hypertension, hyperlipidemia and Hepatitis C s/p treatment admitted with 3 vessel CAD on outpatient cath.  Now s/p CABG on 04/22/20.  Assessment & Plan    # Unstable angina:  # s/p CABG:  # Hyperlipidemia:  # Hypertension:  Doing well post op.  She had LIMA-->LAD, RIMA-->PDA, R radial-->OM-->RI.  Rhythm stable.  Management per surgical team.  She is hesitantly agreeable to starting low dose statin.  Will add rosuvastatin 5mg .  Check lipids/CMP in 6-8 weeks.        For questions or updates, please contact [place] Please consult [email or web address] for contact info under        Signed, Skeet Latch, MD  04/23/2020, 9:49 AM

## 2020-04-23 NOTE — Progress Notes (Signed)
1 Day Post-Op Procedure(s) (LRB): CORONARY ARTERY BYPASS GRAFTING (CABGX4). USING BILATERAL MAMMARIES (N/A) RIGHT RADIAL ARTERY HARVESTING. (Right) TRANSESOPHAGEAL ECHOCARDIOGRAM (TEE) (N/A) INDOCYANINE GREEN FLUORESCENCE IMAGING (ICG) (N/A) CLIPPING OF  LEFT ATRIAL APPENDAGE USING ATRICURE ATRICLIPFLEX-V 35 (Left) Subjective: No complaints  Objective: Vital signs in last 24 hours: Temp:  [96.6 F (35.9 C)-98.8 F (37.1 C)] 97.88 F (36.6 C) (02/24 1200) Pulse Rate:  [73-93] 88 (02/24 1200) Cardiac Rhythm: Atrial paced (02/24 1200) Resp:  [18-50] 30 (02/24 1200) BP: (90-122)/(73-96) 113/77 (02/24 1200) SpO2:  [91 %-100 %] 92 % (02/24 1200) Arterial Line BP: (87-138)/(53-71) 126/56 (02/24 1200) FiO2 (%):  [40 %] 40 % (02/23 1642) Weight:  [61.4 kg] 61.4 kg (02/24 0624)  Hemodynamic parameters for last 24 hours: PAP: (15-40)/(2-33) 15/2 CO:  [1.6 L/min-2.2 L/min] 2.2 L/min CI:  [1 L/min/m2-1.4 L/min/m2] 1.4 L/min/m2  Intake/Output from previous day: 02/23 0701 - 02/24 0700 In: 6464.2 [I.V.:4065.2; Blood:225; IV Piggyback:2174.1] Out: 9390 [Urine:4295; Emesis/NG output:10; Blood:500; Chest Tube:940] Intake/Output this shift: Total I/O In: -  Out: 150 [Chest Tube:150]  General appearance: alert and cooperative Neurologic: intact Heart: regular rate and rhythm, S1, S2 normal, no murmur, click, rub or gallop Lungs: clear to auscultation bilaterally Abdomen: soft, non-tender; bowel sounds normal; no masses,  no organomegaly Extremities: edema minimal Wound: dressed, dry  Lab Results: Recent Labs    04/22/20 1830 04/22/20 1832 04/23/20 0436  WBC 11.1*  --  9.4  HGB 11.9* 11.2* 11.7*  HCT 35.2* 33.0* 34.4*  PLT 118*  --  111*   BMET:  Recent Labs    04/22/20 1830 04/22/20 1832 04/23/20 0436  NA 137 142 139  K 4.0 4.2 3.8  CL 109  --  108  CO2 20*  --  19*  GLUCOSE 170*  --  141*  BUN 11  --  10  CREATININE 0.86  --  0.86  CALCIUM 8.1*  --  8.8*     PT/INR:  Recent Labs    04/22/20 1234  LABPROT 17.3*  INR 1.5*   ABG    Component Value Date/Time   PHART 7.332 (L) 04/22/2020 1832   HCO3 21.3 04/22/2020 1832   TCO2 22 04/22/2020 1832   ACIDBASEDEF 4.0 (H) 04/22/2020 1832   O2SAT 95.0 04/22/2020 1832   CBG (last 3)  Recent Labs    04/23/20 0617 04/23/20 0748 04/23/20 1120  GLUCAP 133* 149* 127*    Assessment/Plan: S/P Procedure(s) (LRB): CORONARY ARTERY BYPASS GRAFTING (CABGX4). USING BILATERAL MAMMARIES (N/A) RIGHT RADIAL ARTERY HARVESTING. (Right) TRANSESOPHAGEAL ECHOCARDIOGRAM (TEE) (N/A) INDOCYANINE GREEN FLUORESCENCE IMAGING (ICG) (N/A) CLIPPING OF  LEFT ATRIAL APPENDAGE USING ATRICURE ATRICLIPFLEX-V 35 (Left) Mobilize See progression orders  Gentle resuscitation pulm toilet Pain control   LOS: 3 days    Wonda Olds 04/23/2020

## 2020-04-23 NOTE — Progress Notes (Signed)
      [address]       [place] [address]             [phone]      POD # 1 CABG x 4, LAA clip  BP 113/84 (BP Location: Left Arm)   Pulse 88   Temp 97.88 F (36.6 C)   Resp (!) 21   Ht 5' 0.25" (1.53 m)   Wt 61.4 kg   SpO2 96%   BMI 26.22 kg/m    Intake/Output Summary (Last 24 hours) at 04/23/2020 1746 Last data filed at 04/23/2020 1700 Gross per 24 hour  Intake 2792.2 ml  Output 2175 ml  Net 617.2 ml   PM labs pending, CBG well controlled  Stable POD # 1  Cara Thaxton C. Roxan Hockey, MD [facility] [phone]

## 2020-04-24 ENCOUNTER — Inpatient Hospital Stay (HOSPITAL_COMMUNITY): Payer: Medicare HMO

## 2020-04-24 LAB — GLUCOSE, CAPILLARY
Glucose-Capillary: 105 mg/dL — ABNORMAL HIGH (ref 70–99)
Glucose-Capillary: 116 mg/dL — ABNORMAL HIGH (ref 70–99)
Glucose-Capillary: 147 mg/dL — ABNORMAL HIGH (ref 70–99)
Glucose-Capillary: 124 mg/dL — ABNORMAL HIGH (ref 70–99)
Glucose-Capillary: 95 mg/dL (ref 70–99)

## 2020-04-24 LAB — CBC
HCT: 30.8 % — ABNORMAL LOW (ref 36.0–46.0)
Hemoglobin: 10.8 g/dL — ABNORMAL LOW (ref 12.0–15.0)
MCH: 30.9 pg (ref 26.0–34.0)
MCHC: 35.1 g/dL (ref 30.0–36.0)
MCV: 88 fL (ref 80.0–100.0)
Platelets: 121 10*3/uL — ABNORMAL LOW (ref 150–400)
RBC: 3.5 MIL/uL — ABNORMAL LOW (ref 3.87–5.11)
RDW: 13.4 % (ref 11.5–15.5)
WBC: 11.4 10*3/uL — ABNORMAL HIGH (ref 4.0–10.5)
nRBC: 0 % (ref 0.0–0.2)

## 2020-04-24 LAB — BASIC METABOLIC PANEL
Anion gap: 11 (ref 5–15)
BUN: 11 mg/dL (ref 8–23)
CO2: 23 mmol/L (ref 22–32)
Calcium: 9.6 mg/dL (ref 8.9–10.3)
Chloride: 102 mmol/L (ref 98–111)
Creatinine, Ser: 0.82 mg/dL (ref 0.44–1.00)
GFR, Estimated: >60 mL/min (ref >60)
Glucose, Bld: 125 mg/dL — ABNORMAL HIGH (ref 70–99)
Potassium: 3.6 mmol/L (ref 3.5–5.1)
Sodium: 136 mmol/L (ref 135–145)

## 2020-04-24 MED ORDER — POTASSIUM CHLORIDE CRYS ER 20 MEQ PO TBCR
20.0000 meq | EXTENDED_RELEASE_TABLET | ORAL | Status: AC
  Administered 2020-04-24 (×3): 20 meq via ORAL
  Filled 2020-04-24: qty 1

## 2020-04-24 MED ORDER — FUROSEMIDE 10 MG/ML IJ SOLN
40.0000 mg | Freq: Two times a day (BID) | INTRAMUSCULAR | Status: DC
  Administered 2020-04-24 (×2): 40 mg via INTRAVENOUS
  Filled 2020-04-24 (×2): qty 4

## 2020-04-24 NOTE — Discharge Summary (Signed)
[address]       [place] [address]             [phone]    Physician Discharge Summary  Patient ID: Archer Sandridge MRN: [id number] DOB/AGE: [birth year] 75 y.o.  Admit date: 04/20/2020 Discharge date: 04/27/2020  Admission Diagnoses:  Patient Active Problem List   Diagnosis Date Noted  . Pure hypercholesterolemia   . Chest pain of uncertain etiology 44/04/4740  . Unstable angina ([place]) 04/20/2020  . Polyarthritis with positive rheumatoid factor ([place]) 02/10/2020  . Statin intolerance 11/26/2019  . Coronary artery calcification 04/05/2019  . Hypertensive nephropathy 03/12/2018  . Chronic renal disease, stage II 03/12/2018  . Estrogen deficiency 03/12/2018  . Hyperlipidemia LDL goal <70 10/20/2017  . Menopause 07/19/2011  . Hypertension   . Hepatitis C    Discharge Diagnoses:  Patient Active Problem List   Diagnosis Date Noted  . S/P CABG x 4 04/26/2020  . Pure hypercholesterolemia   . Chest pain of uncertain etiology 59/56/3875  . Unstable angina ([place]) 04/20/2020  . Polyarthritis with positive rheumatoid factor ([place]) 02/10/2020  . Statin intolerance 11/26/2019  . Coronary artery calcification 04/05/2019  . Hypertensive nephropathy 03/12/2018  . Chronic renal disease, stage II 03/12/2018  . Estrogen deficiency 03/12/2018  . Hyperlipidemia LDL goal <70 10/20/2017  . Menopause 07/19/2011  . Hypertension   . Hepatitis C    Discharged Condition: good   History of Present Illness:  Ms. Virginia Delgado is a 75 yo lady with known history of HTN, Hyperlipidemia, Hepatitis C, and elevated coronary calcium score.  She developed complaints of chest pain recently.  This has been occurring almost daily.  It isn't associated with anything specifically, but occurs when laying down or when walking.  The patient also notices she hears a "pulsing" in her head when she lays down at night.  The patient has continued to walk for exercise and doesn't experience chest pain but  she has been afraid to run.  She was evaluated by Dr. Oval Linsey who felt knowing her calcium score was elevated she should undergo cardiac catheterization.  The patient was agreeable to proceed.  The patient underwent catheterization on 04/20/20 and was found to have severe 3 vessel CAD.  She was admitted to the hospital for possible coronary bypass grafting.  Hospital Course:  The patient was evaluated by Dr. Orvan Seen who felt patient would be a good candidate for surgical revascularization.  The risks and benefits of the procedure were explained to the patient and she was agreeable to proceed.  She was taken to the operating room on 04/22/2020.  She underwent CABG x 4 utilizing LIMA to LAD, RIMA to Left PDA, Sequential Radial artery to OM and Ramus Intermediate. She also underwent clipping of her LA appendage and open right radial artery.  She tolerated the procedure without difficulty and was taken to the SICU in stable condition.  She was extubated the evening of surgery.  During her stay in the SICU the patient was weaned off IV NTG and transitioned to Isordil for her radial artery graft.  Her chest tubes and arterial line were removed without difficulty.  She was diuresed for volume overload state.  She was maintaining NSR and felt stable for transfer to the progressive care unit on 04/25/2020.  The patient continued to make good progress.  She remained in NSR with occasional PVC's.  Her pacing wires were removed without difficulty.  She requested a rolling walker for discharge  which was arranged.  She is ambulating independently.  Her surgical incisions are healing without evidence of infection.  She is felt to be medically stable for discharge home today.   Significant Diagnostic Studies: angiography:   1. Severe three-vessel coronary artery disease, including 80-90% ostial/proximal LAD stenosis, 70-90% lesions of ostial/proximal D1, D2, and ramus intermedius, and diffuse mid/distal LCx disease of up to  80% (dominant LCx).  90% stenosis of nondominant RCA is also present. 2. Normal left ventricular contraction with mildly elevated filling pressure.  Treatments: surgery:    Coronary Artery Bypass Grafting x 4              Left Internal Mammary Artery to Distal Left Anterior Descending Coronary Artery; pedicled RIMA Graft to left  Posterior Descending Coronary Artery; right radial artery graft to Obtuse Marginal Branch of Left Circumflex Coronary Artery and ramus intermedius coronary Artery as a sequenced graft Bilateral IMA harvesting Open right radial artery harvesting Completion graft surveillance with indocyanine green fluorescence imaging (SPY) Left atrial appendage clipping   Discharge Exam: Blood pressure (!) 141/94, pulse 97, temperature 98.4 F (36.9 C), temperature source Oral, resp. rate 16, height 5' 0.25" (1.53 m), weight 58.6 kg, SpO2 98 %.  General appearance: alert, cooperative and no distress Heart: regular rate and rhythm Lungs: clear to auscultation bilaterally Abdomen: soft non-tender, mild BS Extremities: edema trace Wound: clean and dry  Discharge Medications:  The patient has been discharged on:   1.Beta Blocker:  Yes [  x ]                              No   [   ]                              If No, reason:  2.Ace Inhibitor/ARB: Yes [   ]                                     No  [  X  ]                                     If No, reason: labile BP  3.Statin:   Yes [ x  ]                  No  [   ]                  If No, reason:  4.Ecasa:  Yes  [x   ]                  No   [   ]                  If No, reason:     Allergies as of 04/27/2020      Reactions   Contrast Media [iodinated Diagnostic Agents] Swelling   Of face, especially eyes   Gadobutrol Other (See Comments)   Felt extremities go numb briefly as contrast injected.  Had cough (bronchospasm?) briefly after scan.  Resolved on own without treatment.  07/13/15   Gadolinium Derivatives  Other (See Comments)   Felt extremities go numb briefly as  contrast injected.  Had cough (bronchospasm?) briefly after scan.  Resolved on own without treatment.  07/13/15   Lidocaine Other (See Comments)   Facial swelling after dental procedure   Pravachol [pravastatin Sodium]    Latex Rash, Other (See Comments)   and irritation      Medication List    STOP taking these medications   amLODipine 10 MG tablet Commonly known as: NORVASC   losartan-hydrochlorothiazide 50-12.5 MG tablet Commonly known as: HYZAAR     TAKE these medications   acetaminophen 500 MG tablet Commonly known as: TYLENOL Take 2 tablets (1,000 mg total) by mouth every 6 (six) hours as needed.   aspirin 81 MG tablet Take 81 mg by mouth at bedtime.   colchicine 0.6 MG tablet Take 0.5 tablets (0.3 mg total) by mouth 2 (two) times daily for 25 days.   dextromethorphan-guaiFENesin 30-600 MG 12hr tablet Commonly known as: MUCINEX DM Take 1 tablet by mouth 2 (two) times daily as needed for cough.   diclofenac Sodium 1 % Gel Commonly known as: VOLTAREN Apply 2-4 grams to affected joint up to 4 times daily as needed What changed:   how much to take  how to take this  when to take this  reasons to take this   furosemide 40 MG tablet Commonly known as: LASIX Take 1 tablet (40 mg total) by mouth daily.   GNP CAL MAG ZINC +D3 PO Take 1 tablet by mouth in the morning and at bedtime.   isosorbide dinitrate 10 MG tablet Commonly known as: ISORDIL Take 1 tablet (10 mg total) by mouth 3 (three) times daily for 25 days.   metoprolol tartrate 25 MG tablet Commonly known as: LOPRESSOR Take 1 tablet (25 mg total) by mouth 2 (two) times daily.   nitroGLYCERIN 0.4 MG SL tablet Commonly known as: NITROSTAT Place 1 tablet (0.4 mg total) under the tongue every 5 (five) minutes as needed for chest pain.   NONI (MORINDA CITRIFOLIA) PO Take 600 mg by mouth 2 (two) times a week.   Olopatadine HCl 0.2 %  Soln Place 1 drop into both eyes daily as needed (itchy eyes).   OVER THE COUNTER MEDICATION Take 1 capsule by mouth in the morning and at bedtime. [place]   oxyCODONE 5 MG immediate release tablet Commonly known as: Oxy IR/ROXICODONE Take 1 tablet (5 mg total) by mouth every 4 (four) hours as needed for severe pain.   potassium chloride SA 20 MEQ tablet Commonly known as: KLOR-CON Take 2 tablets (40 mEq total) by mouth daily.   predniSONE 50 MG tablet Commonly known as: DELTASONE TAKE 1 TABLET 13 HOURS, 7 HOURS, AND PRIOR TO LEAVING FOR PROCEDURE   rosuvastatin 5 MG tablet Commonly known as: CRESTOR Take 1 tablet (5 mg total) by mouth daily.   Vitamin D 50 MCG (2000 UT) tablet Take 4,000 Units by mouth daily.            Durable Medical Equipment  (From admission, onward)         Start     Ordered   04/26/20 0743  For home use only DME Walker rolling  Once       Question Answer Comment  Walker: With 5 Inch Wheels   Patient needs a walker to treat with the following condition S/P CABG x 4      04/26/20 0743          Follow-up Information    Wonda Olds, MD Follow up on  05/11/2020.   Specialty: Cardiothoracic Surgery Why: Appointment is at 9:30 Contact information: [place] [address] [phone]        Lendon Colonel, NP Follow up on 05/08/2020.   Specialties: Nurse Practitioner, Radiology, Cardiology Why: Appointment is at 10:45 Contact information: [address] [place] [address] [phone]               Signed:  Ellwood Handler, PA-C 04/27/2020, 7:25 AM

## 2020-04-25 ENCOUNTER — Inpatient Hospital Stay (HOSPITAL_COMMUNITY): Payer: Medicare HMO

## 2020-04-25 LAB — GLUCOSE, CAPILLARY
Glucose-Capillary: 109 mg/dL — ABNORMAL HIGH (ref 70–99)
Glucose-Capillary: 98 mg/dL (ref 70–99)
Glucose-Capillary: 105 mg/dL — ABNORMAL HIGH (ref 70–99)
Glucose-Capillary: 147 mg/dL — ABNORMAL HIGH (ref 70–99)
Glucose-Capillary: 123 mg/dL — ABNORMAL HIGH (ref 70–99)
Glucose-Capillary: 125 mg/dL — ABNORMAL HIGH (ref 70–99)

## 2020-04-25 LAB — BASIC METABOLIC PANEL
Anion gap: 8 (ref 5–15)
BUN: 10 mg/dL (ref 8–23)
CO2: 30 mmol/L (ref 22–32)
Calcium: 9.6 mg/dL (ref 8.9–10.3)
Chloride: 100 mmol/L (ref 98–111)
Creatinine, Ser: 0.99 mg/dL (ref 0.44–1.00)
GFR, Estimated: >60 mL/min (ref >60)
Glucose, Bld: 92 mg/dL (ref 70–99)
Potassium: 3.4 mmol/L — ABNORMAL LOW (ref 3.5–5.1)
Sodium: 138 mmol/L (ref 135–145)

## 2020-04-25 LAB — CBC
HCT: 31.3 % — ABNORMAL LOW (ref 36.0–46.0)
Hemoglobin: 10.4 g/dL — ABNORMAL LOW (ref 12.0–15.0)
MCH: 29.8 pg (ref 26.0–34.0)
MCHC: 33.2 g/dL (ref 30.0–36.0)
MCV: 89.7 fL (ref 80.0–100.0)
Platelets: 131 10*3/uL — ABNORMAL LOW (ref 150–400)
RBC: 3.49 MIL/uL — ABNORMAL LOW (ref 3.87–5.11)
RDW: 13.2 % (ref 11.5–15.5)
WBC: 10 10*3/uL (ref 4.0–10.5)
nRBC: 0 % (ref 0.0–0.2)

## 2020-04-25 MED ORDER — ~~LOC~~ CARDIAC SURGERY, PATIENT & FAMILY EDUCATION: Freq: Once | Status: DC

## 2020-04-25 MED ORDER — POTASSIUM CHLORIDE CRYS ER 20 MEQ PO TBCR
40.0000 meq | EXTENDED_RELEASE_TABLET | Freq: Every day | ORAL | Status: DC
  Administered 2020-04-25 – 2020-04-27 (×3): 40 meq via ORAL
  Filled 2020-04-25 (×3): qty 2

## 2020-04-25 MED ORDER — METOPROLOL TARTRATE 25 MG PO TABS
25.0000 mg | ORAL_TABLET | Freq: Two times a day (BID) | ORAL | Status: DC
  Administered 2020-04-25 – 2020-04-27 (×5): 25 mg via ORAL
  Filled 2020-04-25 (×5): qty 1

## 2020-04-25 MED ORDER — FUROSEMIDE 40 MG PO TABS
40.0000 mg | ORAL_TABLET | Freq: Every day | ORAL | Status: DC
  Administered 2020-04-25 – 2020-04-26 (×2): 40 mg via ORAL
  Filled 2020-04-25 (×3): qty 1

## 2020-04-25 MED ORDER — SODIUM CHLORIDE 0.9% FLUSH
3.0000 mL | Freq: Two times a day (BID) | INTRAVENOUS | Status: DC
  Administered 2020-04-25 (×2): 3 mL via INTRAVENOUS

## 2020-04-25 MED ORDER — SODIUM CHLORIDE 0.9 % IV SOLN: 250.0000 mL | INTRAVENOUS | Status: DC | PRN

## 2020-04-25 MED ORDER — SODIUM CHLORIDE 0.9% FLUSH: 3.0000 mL | INTRAVENOUS | Status: DC | PRN

## 2020-04-25 NOTE — Progress Notes (Signed)
      [address]       [place] [address]             [phone]                 3 Days Post-Op Procedure(s) (LRB): CORONARY ARTERY BYPASS GRAFTING (CABGX4). USING BILATERAL MAMMARIES (N/A) RIGHT RADIAL ARTERY HARVESTING. (Right) TRANSESOPHAGEAL ECHOCARDIOGRAM (TEE) (N/A) INDOCYANINE GREEN FLUORESCENCE IMAGING (ICG) (N/A) CLIPPING OF  LEFT ATRIAL APPENDAGE USING ATRICURE ATRICLIPFLEX-V 35 (Left)   Events: No events. Up to chair this am _______________________________________________________________ Vitals: BP 106/71   Pulse 80   Temp 98.3 F (36.8 C) (Oral)   Resp (!) 25   Ht 5' 0.25" (1.53 m)   Wt 62.4 kg   SpO2 98%   BMI 26.64 kg/m   - Neuro: alert NAD  - Cardiovascular: sinus  Drips: none.      - Pulm: EWOB.  SS CT output    ABG    Component Value Date/Time   PHART 7.332 (L) 04/22/2020 1832   PCO2ART 40.1 04/22/2020 1832   PO2ART 81 (L) 04/22/2020 1832   HCO3 21.3 04/22/2020 1832   TCO2 22 04/22/2020 1832   ACIDBASEDEF 4.0 (H) 04/22/2020 1832   O2SAT 95.0 04/22/2020 1832    - Abd: ND - Extremity: warm  .Intake/Output      02/25 0701 02/26 0700   I.V. (mL/kg) 48.7 (0.8)   Total Intake(mL/kg) 48.7 (0.8)   Urine (mL/kg/hr) 3725 (2.5)   Chest Tube 230   Total Output 3955   Net -3906.3       Urine Occurrence 2 x      _______________________________________________________________ Labs: CBC Latest Ref Rng & Units 04/25/2020 04/24/2020 04/23/2020  WBC 4.0 - 10.5 K/uL 10.0 11.4(H) 11.0(H)  Hemoglobin 12.0 - 15.0 g/dL 10.4(L) 10.8(L) 10.3(L)  Hematocrit 36.0 - 46.0 % 31.3(L) 30.8(L) 29.8(L)  Platelets 150 - 400 K/uL 131(L) 121(L) 122(L)   CMP Latest Ref Rng & Units 04/25/2020 04/24/2020 04/23/2020  Glucose 70 - 99 mg/dL 92 125(H) 142(H)  BUN 8 - 23 mg/dL 10 11 11   Creatinine 0.44 - 1.00 mg/dL 0.99 0.82 0.89  Sodium 135 - 145 mmol/L 138 136 135  Potassium 3.5 - 5.1 mmol/L 3.4(L) 3.6 3.6  Chloride 98 - 111 mmol/L 100 102 105   CO2 22 - 32 mmol/L 30 23 24   Calcium 8.9 - 10.3 mg/dL 9.6 9.6 9.0  Total Protein 6.0 - 8.5 g/dL - - -  Total Bilirubin 0.0 - 1.2 mg/dL - - -  Alkaline Phos 44 - 121 IU/L - - -  AST 0 - 40 IU/L - - -  ALT 0 - 32 IU/L - - -    CXR: PV congestion  _______________________________________________________________  Assessment and Plan: POD 3 s/p CABG [place]  Neuro: pain controlled CV: on A/SS/BB.  Increasing metop to 25mg  Pulm: continue pulm toilet.  Will remove CTs today Renal: creat stable.  Good uop.  Will transition to PO lasix GI: on diet Heme: stable ID: afebrile Endo: SSI  Dispo: floor   Lajuana Matte 04/25/2020 6:31 AM

## 2020-04-25 NOTE — Progress Notes (Signed)
CARDIAC REHAB PHASE I   PRE:  Rate/Rhythm: Sinus Rhythm with occ PVC  BP:  Supine: 125/80  Sitting: 134/83  Standing: 114/80   SaO2: 95% RA  MODE:  Ambulation: 50 ft   POST:  Rate/Rhythem: 90  BP:    Sitting: 123/80     SaO2: 95% 1430-1520 Patient reported feeling a little lightheaded all day. Orthostatic BP's checked. Walked patient in room using rolling walker. Tolerated without difficulty. Patient assisted to side of bed then to recliner with call bell within reach. Patient's RN notified about reported symptoms. Had patient use incentive spirometer. No symptoms sitting. Encouraged the patient to drink more water.    Harrell Gave RN BSN

## 2020-04-25 NOTE — Progress Notes (Signed)
Mobility Specialist: Progress Note   04/25/20 1500  Mobility  Activity Ambulated in hall  Level of Assistance Modified independent, requires aide device or extra time  Assistive Device Front wheel walker  Distance Ambulated (ft) 220 ft  Mobility Response Tolerated well  Mobility performed by Mobility specialist  Bed Position Chair  $Mobility charge 1 Mobility   Pre-Mobility: 80 HR, 93% SpO2 Post-Mobility: 78 HR, 130/74 BP, 100% SpO2  Pt asx during ambulation with slow steady gait. Pt to chair after walk.  [facility] Aleathia Purdy Mobility Specialist Mobility Specialist Phone: [phone]

## 2020-04-26 DIAGNOSIS — Z951 Presence of aortocoronary bypass graft: Secondary | ICD-10-CM

## 2020-04-26 LAB — CBC
HCT: 29.8 % — ABNORMAL LOW (ref 36.0–46.0)
Hemoglobin: 10.5 g/dL — ABNORMAL LOW (ref 12.0–15.0)
MCH: 30.8 pg (ref 26.0–34.0)
MCHC: 35.2 g/dL (ref 30.0–36.0)
MCV: 87.4 fL (ref 80.0–100.0)
Platelets: 158 10*3/uL (ref 150–400)
RBC: 3.41 MIL/uL — ABNORMAL LOW (ref 3.87–5.11)
RDW: 13 % (ref 11.5–15.5)
WBC: 9.4 10*3/uL (ref 4.0–10.5)
nRBC: 0.2 % (ref 0.0–0.2)

## 2020-04-26 LAB — BASIC METABOLIC PANEL
Anion gap: 6 (ref 5–15)
BUN: 12 mg/dL (ref 8–23)
CO2: 29 mmol/L (ref 22–32)
Calcium: 9.3 mg/dL (ref 8.9–10.3)
Chloride: 100 mmol/L (ref 98–111)
Creatinine, Ser: 0.87 mg/dL (ref 0.44–1.00)
GFR, Estimated: >60 mL/min (ref >60)
Glucose, Bld: 90 mg/dL (ref 70–99)
Potassium: 3.5 mmol/L (ref 3.5–5.1)
Sodium: 135 mmol/L (ref 135–145)

## 2020-04-26 LAB — GLUCOSE, CAPILLARY
Glucose-Capillary: 99 mg/dL (ref 70–99)
Glucose-Capillary: 96 mg/dL (ref 70–99)
Glucose-Capillary: 80 mg/dL (ref 70–99)
Glucose-Capillary: 101 mg/dL — ABNORMAL HIGH (ref 70–99)

## 2020-04-26 MED ORDER — DM-GUAIFENESIN ER 30-600 MG PO TB12
1.0000 | ORAL_TABLET | Freq: Two times a day (BID) | ORAL | Status: DC | PRN
  Administered 2020-04-26: 1 via ORAL
  Filled 2020-04-26: qty 1

## 2020-04-26 NOTE — Progress Notes (Signed)
Attempted to removed EPWs.  Atrial wire with suture wrapped in mult places, tucked under prevena dressing, difficult to access and see.  Pt did not tolerate well, and asked me to stop.  Will attempt again later.

## 2020-04-26 NOTE — Progress Notes (Addendum)
      [address]       [place], [address]             [phone]      4 Days Post-Op Procedure(s) (LRB): CORONARY ARTERY BYPASS GRAFTING (CABGX4). USING BILATERAL MAMMARIES (N/A) RIGHT RADIAL ARTERY HARVESTING. (Right) TRANSESOPHAGEAL ECHOCARDIOGRAM (TEE) (N/A) INDOCYANINE GREEN FLUORESCENCE IMAGING (ICG) (N/A) CLIPPING OF  LEFT ATRIAL APPENDAGE USING ATRICURE ATRICLIPFLEX-V 35 (Left)   Subjective:  Patient feels like she is doing pretty well.  She is experiencing a persistent cough that she isn't able to bring anything up.  +ambulation + BM  Objective: Vital signs in last 24 hours: Temp:  [97.6 F (36.4 C)-98.8 F (37.1 C)] 98.1 F (36.7 C) (02/27 0342) Pulse Rate:  [78-86] 84 (02/27 0342) Cardiac Rhythm: Normal sinus rhythm (02/27 0350) Resp:  [14-22] 18 (02/27 0342) BP: (105-140)/(69-80) 105/69 (02/27 0342) SpO2:  [90 %-99 %] 90 % (02/27 0342) Weight:  [59.4 kg] 59.4 kg (02/27 0342)  Intake/Output from previous day: 02/26 0701 - 02/27 0700 In: 360 [P.O.:360] Out: 80 [Chest Tube:80]  General appearance: alert, cooperative and no distress Heart: regular rate and rhythm Lungs: clear to auscultation bilaterally Abdomen: soft, non-tender; bowel sounds normal; no masses,  no organomegaly Extremities: edema trace Wound: clean and dry radial site with staples in place, pravena on sternum  Lab Results: Recent Labs    04/25/20 0511 04/26/20 0158  WBC 10.0 9.4  HGB 10.4* 10.5*  HCT 31.3* 29.8*  PLT 131* 158   BMET:  Recent Labs    04/25/20 0511 04/26/20 0158  NA 138 135  K 3.4* 3.5  CL 100 100  CO2 30 29  GLUCOSE 92 90  BUN 10 12  CREATININE 0.99 0.87  CALCIUM 9.6 9.3    PT/INR: No results for input(s): LABPROT, INR in the last 72 hours. ABG    Component Value Date/Time   PHART 7.332 (L) 04/22/2020 1832   HCO3 21.3 04/22/2020 1832   TCO2 22 04/22/2020 1832   ACIDBASEDEF 4.0 (H) 04/22/2020 1832   O2SAT 95.0 04/22/2020 1832   CBG  (last 3)  Recent Labs    04/25/20 2023 04/25/20 2326 04/26/20 0344  GLUCAP 123* 125* 99    Assessment/Plan: S/P Procedure(s) (LRB): CORONARY ARTERY BYPASS GRAFTING (CABGX4). USING BILATERAL MAMMARIES (N/A) RIGHT RADIAL ARTERY HARVESTING. (Right) TRANSESOPHAGEAL ECHOCARDIOGRAM (TEE) (N/A) INDOCYANINE GREEN FLUORESCENCE IMAGING (ICG) (N/A) CLIPPING OF  LEFT ATRIAL APPENDAGE USING ATRICURE ATRICLIPFLEX-V 35 (Left)  1. CV- NSR with PVCs, BP well controlled- Continue Lopressor 25 mg BID, Isordil for radial artery 2. Pulm- no acute issues, off oxygen, will add Mucinex to help facilitate better cough and loosen up sputum 3. Renal- creatinine WNL, K at 3.5 continue supplementation, continue Lasix 4. Expected post operative blood loss anemia, mild, Hgb at 10.5 5. CBGs controlled, patient isn't a diabetic will d/c SSIP 6. Dispo- patient stable, overall doing well, add Mucinex for relief of cough, d/c EPW today, if stable will plan to d/c patient in AM   LOS: 6 days    Ellwood Handler, PA-C 04/26/2020   Agree with above Doing well Likely discharge tomorrow  Lajuana Matte

## 2020-04-26 NOTE — Progress Notes (Signed)
EPWs removed.  Appreciate assistance from Dr Kipp Brood with atrial wire.  Pt understands bedrest for one hr with frequent VS.  CCMD notified, will monitor closely

## 2020-04-26 NOTE — Progress Notes (Signed)
Mobility Specialist: Progress Note   04/26/20 1103  Mobility  Activity Ambulated in hall  Level of Assistance Modified independent, requires aide device or extra time  Assistive Device Front wheel walker  Distance Ambulated (ft) 400 ft  Mobility Response Tolerated well  Mobility performed by Mobility specialist  $Mobility charge 1 Mobility   Pre-Mobility: 84 HR, 90% SpO2 Post-Mobility: 81 HR, 138/86 BP, 95% SpO2  Pt c/o feeling "a little dizzy" just before returning to room. Pt back to bed per RN request to have EPW removed. Pt desat to 89% after returning to room but quickly came up to 95%, RN notified.   [facility] Myalynn Lingle Mobility Specialist Mobility Specialist Phone: [phone]

## 2020-04-27 ENCOUNTER — Inpatient Hospital Stay (HOSPITAL_COMMUNITY): Payer: Medicare HMO

## 2020-04-27 DIAGNOSIS — R531 Weakness: Secondary | ICD-10-CM | POA: Diagnosis not present

## 2020-04-27 DIAGNOSIS — J9811 Atelectasis: Secondary | ICD-10-CM | POA: Diagnosis not present

## 2020-04-27 DIAGNOSIS — I517 Cardiomegaly: Secondary | ICD-10-CM | POA: Diagnosis not present

## 2020-04-27 MED ORDER — DM-GUAIFENESIN ER 30-600 MG PO TB12: 1.0000 | ORAL_TABLET | Freq: Two times a day (BID) | ORAL | Status: DC | PRN

## 2020-04-27 MED ORDER — ROSUVASTATIN CALCIUM 5 MG PO TABS: 5.0000 mg | ORAL_TABLET | Freq: Every day | ORAL | 3 refills | Status: DC

## 2020-04-27 MED ORDER — OXYCODONE HCL 5 MG PO TABS: 5.0000 mg | ORAL_TABLET | ORAL | 0 refills | Status: DC | PRN

## 2020-04-27 MED ORDER — ISOSORBIDE DINITRATE 10 MG PO TABS: 10.0000 mg | ORAL_TABLET | Freq: Three times a day (TID) | ORAL | 0 refills | Status: DC

## 2020-04-27 MED ORDER — COLCHICINE 0.6 MG PO TABS: 0.3000 mg | ORAL_TABLET | Freq: Two times a day (BID) | ORAL | 0 refills | Status: DC

## 2020-04-27 MED ORDER — FUROSEMIDE 40 MG PO TABS: 40.0000 mg | ORAL_TABLET | Freq: Every day | ORAL | 0 refills | Status: DC

## 2020-04-27 MED ORDER — ACETAMINOPHEN 500 MG PO TABS: 1000.0000 mg | ORAL_TABLET | Freq: Four times a day (QID) | ORAL | 0 refills | Status: AC | PRN

## 2020-04-27 MED ORDER — METOPROLOL TARTRATE 25 MG PO TABS: 25.0000 mg | ORAL_TABLET | Freq: Two times a day (BID) | ORAL | 3 refills | Status: DC

## 2020-04-27 MED ORDER — POTASSIUM CHLORIDE CRYS ER 20 MEQ PO TBCR: 40.0000 meq | EXTENDED_RELEASE_TABLET | Freq: Every day | ORAL | 0 refills | Status: DC

## 2020-04-27 MED FILL — Sodium Chloride IV Soln 0.9%: INTRAVENOUS | Qty: 3000 | Status: AC

## 2020-04-27 MED FILL — Thrombin For Soln 5000 Unit: CUTANEOUS | Qty: 5000 | Status: AC

## 2020-04-27 MED FILL — Sodium Bicarbonate IV Soln 8.4%: INTRAVENOUS | Qty: 50 | Status: AC

## 2020-04-27 MED FILL — Calcium Chloride Inj 10%: INTRAVENOUS | Qty: 10 | Status: AC

## 2020-04-27 MED FILL — Heparin Sodium (Porcine) Inj 1000 Unit/ML: INTRAMUSCULAR | Qty: 10 | Status: AC

## 2020-04-27 MED FILL — Electrolyte-R (PH 7.4) Solution: INTRAVENOUS | Qty: 5000 | Status: AC

## 2020-04-27 MED FILL — Mannitol IV Soln 20%: INTRAVENOUS | Qty: 500 | Status: AC

## 2020-04-27 MED FILL — Lidocaine HCl Local Soln Prefilled Syringe 100 MG/5ML (2%): INTRAMUSCULAR | Qty: 5 | Status: AC

## 2020-04-27 NOTE — Care Management Important Message (Addendum)
Important Message  Patient Details  Name: Virginia Delgado MRN: [id number] Date of Birth: 1945/11/29   Medicare Important Message Given:  Yes -  Pt. D/C mailed IM letter to pt. Home address.     Holli Humbles [facility] 04/27/2020, 9:46 AM

## 2020-04-27 NOTE — Progress Notes (Signed)
Called to patients room complaining of severe "weakness" and sharp R abdominal pain. VSS, abdomen soft BS active paged MD no new orders given 2130: on recheck pt states she feels better , no complaints will continue to monitor

## 2020-04-27 NOTE — Progress Notes (Addendum)
      [address]       ,[place] [address]             [phone]      5 Days Post-Op Procedure(s) (LRB): CORONARY ARTERY BYPASS GRAFTING (CABGX4). USING BILATERAL MAMMARIES (N/A) RIGHT RADIAL ARTERY HARVESTING. (Right) TRANSESOPHAGEAL ECHOCARDIOGRAM (TEE) (N/A) INDOCYANINE GREEN FLUORESCENCE IMAGING (ICG) (N/A) CLIPPING OF  LEFT ATRIAL APPENDAGE USING ATRICURE ATRICLIPFLEX-V 35 (Left)   Subjective:  No new complaints.  Episode of RUQ pain last night resolved.  States this comes and goes at times.  She has moved her bowels.  Objective: Vital signs in last 24 hours: Temp:  [97.8 F (36.6 C)-98.7 F (37.1 C)] 98.4 F (36.9 C) (02/28 0330) Pulse Rate:  [82-97] 97 (02/28 0330) Cardiac Rhythm: Normal sinus rhythm (02/27 1940) Resp:  [16-20] 16 (02/28 0330) BP: (129-148)/(76-94) 141/94 (02/28 0330) SpO2:  [93 %-98 %] 98 % (02/28 0330) Weight:  [58.6 kg] 58.6 kg (02/28 0330)  Intake/Output from previous day: 02/27 0701 - 02/28 0700 In: 340 [P.O.:340] Out: -   General appearance: alert, cooperative and no distress Heart: regular rate and rhythm Lungs: clear to auscultation bilaterally Abdomen: soft non-tender, mild BS Extremities: edema trace Wound: clean and dry  Lab Results: Recent Labs    04/25/20 0511 04/26/20 0158  WBC 10.0 9.4  HGB 10.4* 10.5*  HCT 31.3* 29.8*  PLT 131* 158   BMET:  Recent Labs    04/25/20 0511 04/26/20 0158  NA 138 135  K 3.4* 3.5  CL 100 100  CO2 30 29  GLUCOSE 92 90  BUN 10 12  CREATININE 0.99 0.87  CALCIUM 9.6 9.3    PT/INR: No results for input(s): LABPROT, INR in the last 72 hours. ABG    Component Value Date/Time   PHART 7.332 (L) 04/22/2020 1832   HCO3 21.3 04/22/2020 1832   TCO2 22 04/22/2020 1832   ACIDBASEDEF 4.0 (H) 04/22/2020 1832   O2SAT 95.0 04/22/2020 1832   CBG (last 3)  Recent Labs    04/26/20 0751 04/26/20 1132 04/26/20 1656  GLUCAP 96 80 101*    Assessment/Plan: S/P  Procedure(s) (LRB): CORONARY ARTERY BYPASS GRAFTING (CABGX4). USING BILATERAL MAMMARIES (N/A) RIGHT RADIAL ARTERY HARVESTING. (Right) TRANSESOPHAGEAL ECHOCARDIOGRAM (TEE) (N/A) INDOCYANINE GREEN FLUORESCENCE IMAGING (ICG) (N/A) CLIPPING OF  LEFT ATRIAL APPENDAGE USING ATRICURE ATRICLIPFLEX-V 35 (Left)  1. CV- NSR with PVCs, BP stable- continue Lopressor, Isordil 2. Pulm- no acute issues, off oxygen continue IS 3. Renal- creatinine, lytes okay will taper Lasix 4. GI- RUQ pain likely gas pains, has since resolved, continue prn stool softner 5. Dispo- patient stable, will d/c home today   LOS: 7 days    Ellwood Handler, PA-C 04/27/2020

## 2020-04-27 NOTE — Progress Notes (Signed)
Discharge orders received.  IV and telemetry removed, CCMD notified. Discharge education reviewed with pt, questions addressed.  Pt and daughter present for teaching.  They express understanding regarding f/u appts, med regimen, and home care.  Pt transported off the floor via w/c by [place] students, daughter to transport to her home in [place].

## 2020-04-27 NOTE — TOC Transition Note (Signed)
Transition of Care (TOC) - CM/SW Discharge Note Marvetta Gibbons RN, BSN Transitions of Care Unit 4E- RN Case Manager See Treatment Team for direct phone #    Patient Details  Name: Laiba Fuerte MRN: [id number] Date of Birth: 1945-06-03  [facility] of [facility]) CM/SW Contact:  Dawayne Patricia, RN Phone Number: 04/27/2020, 1:14 PM   Clinical Narrative:    Pt stable for transition home today, orders for DME needs placed- RW and 3n1- call made to [employer] in house provider- 3n1 and RW to be delivered to room prior to discharge. Daughter to transport home around 11am.  No further TOC needs noted.    Final next level of care: Home/Self Care Barriers to Discharge: No Barriers Identified   Patient Goals and CMS Choice Patient states their goals for this hospitalization and ongoing recovery are:: return home   Choice offered to / list presented to : Patient  Discharge Placement                Home        Discharge Plan and Services   Discharge Planning Services: CM Consult Post Acute Care Choice: Durable Medical Equipment          DME Arranged: 3-N-1,Walker rolling DME Agency: [employer] Date DME Agency Contacted: 04/27/20 Time DME Agency Contacted: 1000 Representative spoke with at DME Agency: Freda Munro HH Arranged: NA Union Agency: NA        Social Determinants of Health ([place]) Interventions     Readmission Risk Interventions No flowsheet data found.

## 2020-04-27 NOTE — Progress Notes (Signed)
CARDIAC REHAB PHASE I   D/c education completed with pt. Pt educated on importance of site care and monitoring incisions daily. Encouraged continued IS use, walks, and sternal precautions. Pt given in-the-tube sheet and heart healthy diet. Reviewed restrictions and exercise guidelines. Will refer to CRP II [place]. Pt requesting walker and 3-in-1 for home use. RN made aware.  [phone] Rufina Falco, RN BSN 04/27/2020 9:02 AM

## 2020-04-30 ENCOUNTER — Encounter: Payer: Self-pay | Admitting: *Deleted

## 2020-04-30 DIAGNOSIS — R002 Palpitations: Secondary | ICD-10-CM | POA: Diagnosis not present

## 2020-04-30 DIAGNOSIS — R7989 Other specified abnormal findings of blood chemistry: Secondary | ICD-10-CM | POA: Diagnosis not present

## 2020-04-30 DIAGNOSIS — R079 Chest pain, unspecified: Secondary | ICD-10-CM | POA: Diagnosis not present

## 2020-04-30 DIAGNOSIS — R059 Cough, unspecified: Secondary | ICD-10-CM | POA: Diagnosis not present

## 2020-04-30 DIAGNOSIS — I493 Ventricular premature depolarization: Secondary | ICD-10-CM | POA: Diagnosis not present

## 2020-04-30 DIAGNOSIS — I2510 Atherosclerotic heart disease of native coronary artery without angina pectoris: Secondary | ICD-10-CM | POA: Diagnosis not present

## 2020-04-30 DIAGNOSIS — R0600 Dyspnea, unspecified: Secondary | ICD-10-CM | POA: Diagnosis not present

## 2020-04-30 DIAGNOSIS — Z5329 Procedure and treatment not carried out because of patient's decision for other reasons: Secondary | ICD-10-CM | POA: Diagnosis not present

## 2020-04-30 DIAGNOSIS — J90 Pleural effusion, not elsewhere classified: Secondary | ICD-10-CM | POA: Diagnosis not present

## 2020-04-30 DIAGNOSIS — Z951 Presence of aortocoronary bypass graft: Secondary | ICD-10-CM | POA: Diagnosis not present

## 2020-04-30 DIAGNOSIS — R0602 Shortness of breath: Secondary | ICD-10-CM | POA: Diagnosis not present

## 2020-04-30 DIAGNOSIS — I10 Essential (primary) hypertension: Secondary | ICD-10-CM | POA: Diagnosis not present

## 2020-04-30 NOTE — Progress Notes (Signed)
Patient ID: Virginia Delgado, female   DOB: 13-Dec-1945, 75 y.o.   MRN: [id number] [employer] notified to cancel ZIO XT enrolled 04/17/20.  Patient mailing monitor back.  She had CABG and Dr. Oval Linsey said monitor no longer necessary.

## 2020-05-01 ENCOUNTER — Telehealth (HOSPITAL_COMMUNITY): Payer: Self-pay

## 2020-05-01 NOTE — Telephone Encounter (Signed)
Per Phase I, fax cardiac rehab referral to [facility] cardiac rehab.

## 2020-05-04 ENCOUNTER — Encounter (HOSPITAL_COMMUNITY): Payer: Medicare HMO

## 2020-05-05 ENCOUNTER — Encounter: Payer: Self-pay | Admitting: Internal Medicine

## 2020-05-05 ENCOUNTER — Telehealth: Payer: Self-pay | Admitting: Cardiovascular Disease

## 2020-05-05 NOTE — Telephone Encounter (Signed)
Patient sent following message in regards to her appointment  Please call to discuss appointment  ----- Message -----      From:Virginia Delgado      Sent:05/05/2020  2:00 AM EST        ZO:[id number] Oval Linsey, MD   Subject:Upcoming  Appt w/[name] e  I would like to reschedule my appointment for March 11  with [name] to [place]  anytime after 10:30 am I am attempting to group apps  in [place] to avoid frequent car rides from  [name] during my convolescense..  Thank you

## 2020-05-06 NOTE — Progress Notes (Signed)
Cardiology Office Note   Date:  05/08/2020   ID:  Virginia Delgado, DOB [birth year], MRN [id number]  PCP:  Glendale Chard, MD  Cardiologist: Dr. Oval Linsey CC; post hospital follow-up/status post CABG   History of Present Illness: Virginia Delgado is a 75 y.o. female who presents for posthospitalization follow-up after admission on 04/20/2020 for chest discomfort.  The patient underwent cardiac catheterization on 04/20/2020 did revealed severe three-vessel coronary artery disease, and admitted for possible coronary artery bypass grafting.  She underwent CABG x4 utilizing LIMA to LAD, RIMA to left PDA, sequential radial artery to OM and ramus intermediate.  She also underwent clipping of her left atrial appendage and open right radial artery.  She maintained normal sinus rhythm during her convalescence.  She requested a rolling walker for ambulation support which was provided for her.  On discharge, amlodipine and losartan HCTZ were discontinued.  She is started on colchicine 0.5 mg by mouth 2 times a day for 25 days, furosemide 40 mg daily isosorbide dinitrate 10 mg 3 times a day metoprolol 25 mg twice daily potassium 20 mEq daily, rosuvastatin 5 mg daily.  She followed up with Dr. Kipp Brood on 04/26/2020, cardiothoracic surgeon, at which time she was experiencing a persistent cough and which was not reported to be productive.  She was continued on Isordil for radial artery, Mucinex was added to help facilitate better cough and loosen up sputum.  She comes today with complaints of generalized pain in her chest, frequent nonproductive coughing, generalized weakness requiring use of rolling walker, pain in her right forearm from radial graft.  She has not been taking a lot of deep breaths because it is painful to her.  She has been medically compliant.  Past Medical History:  Diagnosis Date  . Coronary artery calcification 04/05/2019   Calcium score 93rd percentile 08/2018.  Marland Kitchen Dermatitis   . Hx of hepatitis C   .  Hypertension     Past Surgical History:  Procedure Laterality Date  . BUNIONECTOMY Bilateral   . CESAREAN SECTION  1976  . CESAREAN SECTION  1979  . CLIPPING OF ATRIAL APPENDAGE Left 04/22/2020   Procedure: CLIPPING OF  LEFT ATRIAL APPENDAGE USING ATRICURE ATRICLIPFLEX-V 35;  Surgeon: Wonda Olds, MD;  Location: [facility];  Service: Open Heart Surgery;  Laterality: Left;  . CORONARY ARTERY BYPASS GRAFT N/A 04/22/2020   Procedure: CORONARY ARTERY BYPASS GRAFTING (CABGX4). USING BILATERAL MAMMARIES;  Surgeon: Wonda Olds, MD;  Location: [facility];  Service: Open Heart Surgery;  Laterality: N/A;  BIMA  . LEFT HEART CATH AND CORONARY ANGIOGRAPHY N/A 04/20/2020   Procedure: LEFT HEART CATH AND CORONARY ANGIOGRAPHY;  Surgeon: Nelva Bush, MD;  Location: [facility];  Service: Cardiovascular;  Laterality: N/A;  . RADIAL ARTERY HARVEST Right 04/22/2020   Procedure: RIGHT RADIAL ARTERY HARVESTING.;  Surgeon: Wonda Olds, MD;  Location: [facility];  Service: Open Heart Surgery;  Laterality: Right;  . TEE WITHOUT CARDIOVERSION N/A 04/22/2020   Procedure: TRANSESOPHAGEAL ECHOCARDIOGRAM (TEE);  Surgeon: Wonda Olds, MD;  Location: [facility];  Service: Open Heart Surgery;  Laterality: N/A;  . UMBILICAL HERNIA REPAIR  1981     Current Outpatient Medications  Medication Sig Dispense Refill  . acetaminophen (TYLENOL) 500 MG tablet Take 2 tablets (1,000 mg total) by mouth every 6 (six) hours as needed. 30 tablet 0  . aspirin 81 MG tablet Take 81 mg by mouth at bedtime.    . Cholecalciferol (VITAMIN D) 50 MCG (2000  UT) tablet Take 4,000 Units by mouth daily.    Marland Kitchen dextromethorphan-guaiFENesin (MUCINEX DM) 30-600 MG 12hr tablet Take 1 tablet by mouth 2 (two) times daily as needed for cough.    . metoprolol tartrate (LOPRESSOR) 25 MG tablet Take 1 tablet (25 mg total) by mouth 2 (two) times daily. 60 tablet 3  . Multiple Minerals-Vitamins (GNP CAL MAG ZINC +D3 PO) Take 1 tablet by mouth in the  morning and at bedtime.    . NONI, MORINDA CITRIFOLIA, PO Take 600 mg by mouth 2 (two) times a week.    . Olopatadine HCl 0.2 % SOLN Place 1 drop into both eyes daily as needed (itchy eyes).    Marland Kitchen OVER THE COUNTER MEDICATION Take 1 capsule by mouth in the morning and at bedtime. [place]    . oxyCODONE (OXY IR/ROXICODONE) 5 MG immediate release tablet Take 1 tablet (5 mg total) by mouth every 4 (four) hours as needed for severe pain. 30 tablet 0  . rosuvastatin (CRESTOR) 5 MG tablet Take 1 tablet (5 mg total) by mouth daily. 30 tablet 3   Current Facility-Administered Medications  Medication Dose Route Frequency Provider Last Rate Last Admin  . Ipratropium-Albuterol (COMBIVENT) respimat 1 puff  1 puff Inhalation Q6H PRN Wonda Olds, MD        Allergies:   Contrast media [iodinated diagnostic agents], Gadobutrol, Gadolinium derivatives, Lidocaine, Pravachol [pravastatin sodium], and Latex    Social History:  The patient  reports that she has never smoked. She has never used smokeless tobacco. She reports current alcohol use. She reports that she does not use drugs.   Family History:  The patient's family history includes COPD in her mother; Cancer in her mother and sister; Heart disease in her father; Hypertension in her brother and sister; Ovarian cancer in her maternal grandmother; Rheum arthritis in her paternal grandmother.    ROS: All other systems are reviewed and negative. Unless otherwise mentioned in H&P    PHYSICAL EXAM: VS:  BP 126/76   Ht 5\' 1"  (1.549 m)   Wt 122 lb 9.6 oz (55.6 kg)   BMI 23.17 kg/m  , BMI Body mass index is 23.17 kg/m. GEN: Well nourished, well developed, in no acute distress HEENT: normal Neck: no JVD, carotid bruits, or masses Cardiac: RRR; no murmurs, rubs, or gallops,no edema  Respiratory:  Clear to auscultation bilaterall in the upper lobes, diminished bilateral lower lobes, no wheezing.   GI: soft, nontender, nondistended, + BS MS: no  deformity or atrophy, midsternotom incision site well-healed, Steri-Strips to chest tube insertion sites, right radial/forearm incision site with staples, no evidence of infection Skin: warm and dry, no rash Neuro:  Strength and sensation are intact Psych: euthymic mood, flat affect   EKG: Patient refused.   Recent Labs: 04/13/2020: ALT 12 04/17/2020: TSH 1.290 04/23/2020: Magnesium 2.3 04/26/2020: BUN 12; Creatinine, Ser 0.87; Hemoglobin 10.5; Platelets 158; Potassium 3.5; Sodium 135    Lipid Panel    Component Value Date/Time   CHOL 219 (H) 04/13/2020 0929   TRIG 68 04/13/2020 0929   HDL 82 04/13/2020 0929   CHOLHDL 2.7 04/13/2020 0929   LDLCALC 125 (H) 04/13/2020 0929      Wt Readings from Last 3 Encounters:  05/08/20 122 lb (55.3 kg)  05/08/20 122 lb 9.6 oz (55.6 kg)  04/27/20 129 lb 3.2 oz (58.6 kg)      Other studies Reviewed: 1. Severe three-vessel coronary artery disease, including 80-90% ostial/proximal LAD stenosis, 70-90% lesions  of ostial/proximal D1, D2, and ramus intermedius, and diffuse mid/distal LCx disease of up to 80% (dominant LCx). 90% stenosis of nondominant RCA is also present. 2. Normal left ventricular contraction with mildly elevated filling pressure.  Treatments: surgery:    Coronary Artery Bypass Grafting x4 Left Internal Mammary Artery to Distal Left Anterior Descending Coronary Artery; pedicled RIMAGraft to leftPosterior Descending Coronary Artery; right radial artery graft toObtuse Marginal Branch of Left Circumflex Coronary Artery andramus intermedius coronary Arteryas a sequenced graft Bilateral IMA harvesting Open right radial artery harvesting Completion graft surveillance with indocyanine green fluorescence imaging (SPY) Left atrial appendage clipping   ASSESSMENT AND PLAN:  1.  Coronary artery disease: Status post CABG x4, utilization of open right radial artery grafting.  She continues to be in a lot of pain  in her incision sites, also with deep breathing and coughing.  She is due to see CVTS after leaving the office today for further evaluation and likely will have a chest x-ray.  Will not make any changes or do additional testing until released by surgeon.  2.  Hypertension: Currently well controlled.  The patient is medically compliant and therefore no new additions or changes will be made concerning antihypertensives.  3.  Hyperlipidemia: She will continue rosuvastatin 5 mg daily.  Goal of LDL less than 70.  Follow-up labs in 3 months   Current medicines are reviewed at length with the patient today.  I have spent 20 min's dedicated to the care of this patient on the date of this encounter to include pre-visit review of records, assessment, management and diagnostic testing,with shared decision making.  Labs/ tests ordered today include: None Phill Myron. West Pugh, ANP, AACC   05/08/2020 12:46 PM    [facility] [place] [address] Office [phone] Fax [phone]  Notice: This dictation was prepared with Dragon dictation along with smaller phrase technology. Any transcriptional errors that result from this process are unintentional and may not be corrected upon review.

## 2020-05-08 ENCOUNTER — Encounter: Payer: Self-pay | Admitting: Adult Health

## 2020-05-08 ENCOUNTER — Ambulatory Visit: Payer: Medicare HMO | Admitting: Adult Health

## 2020-05-08 ENCOUNTER — Ambulatory Visit (INDEPENDENT_AMBULATORY_CARE_PROVIDER_SITE_OTHER): Payer: Self-pay | Admitting: Cardiothoracic Surgery

## 2020-05-08 ENCOUNTER — Other Ambulatory Visit: Payer: Self-pay | Admitting: Cardiothoracic Surgery

## 2020-05-08 ENCOUNTER — Other Ambulatory Visit: Payer: Self-pay

## 2020-05-08 ENCOUNTER — Ambulatory Visit
Admission: RE | Admit: 2020-05-08 | Discharge: 2020-05-08 | Disposition: A | Discharge status: Home / Self Care | Payer: Medicare HMO | Source: Ambulatory Visit | Attending: Cardiothoracic Surgery | Admitting: Cardiothoracic Surgery

## 2020-05-08 ENCOUNTER — Telehealth: Payer: Self-pay | Admitting: Adult Health

## 2020-05-08 ENCOUNTER — Other Ambulatory Visit: Payer: Self-pay | Admitting: *Deleted

## 2020-05-08 VITALS — BP 137/83 | HR 75 | Temp 97.3°F | Resp 20 | Ht 61.0 in | Wt 122.0 lb

## 2020-05-08 VITALS — BP 126/76 | Ht 61.0 in | Wt 122.6 lb

## 2020-05-08 DIAGNOSIS — Z951 Presence of aortocoronary bypass graft: Secondary | ICD-10-CM

## 2020-05-08 DIAGNOSIS — I10 Essential (primary) hypertension: Secondary | ICD-10-CM | POA: Diagnosis not present

## 2020-05-08 DIAGNOSIS — R0602 Shortness of breath: Secondary | ICD-10-CM | POA: Diagnosis not present

## 2020-05-08 DIAGNOSIS — E785 Hyperlipidemia, unspecified: Secondary | ICD-10-CM | POA: Diagnosis not present

## 2020-05-08 DIAGNOSIS — R059 Cough, unspecified: Secondary | ICD-10-CM | POA: Diagnosis not present

## 2020-05-08 DIAGNOSIS — I2510 Atherosclerotic heart disease of native coronary artery without angina pectoris: Secondary | ICD-10-CM

## 2020-05-08 MED ORDER — IPRATROPIUM-ALBUTEROL 20-100 MCG/ACT IN AERS: 1.0000 | INHALATION_SPRAY | Freq: Four times a day (QID) | RESPIRATORY_TRACT | 0 refills | Status: DC | PRN

## 2020-05-08 MED ORDER — IPRATROPIUM-ALBUTEROL 20-100 MCG/ACT IN AERS: 1.0000 | INHALATION_SPRAY | Freq: Four times a day (QID) | RESPIRATORY_TRACT | Status: DC

## 2020-05-08 MED ORDER — IPRATROPIUM-ALBUTEROL 20-100 MCG/ACT IN AERS: 1.0000 | INHALATION_SPRAY | Freq: Four times a day (QID) | RESPIRATORY_TRACT | Status: DC | PRN

## 2020-05-08 NOTE — Telephone Encounter (Signed)
Left message for patient to call and schedule the 3 month folllow up visit with Dr. Oval Linsey

## 2020-05-08 NOTE — Patient Instructions (Addendum)
Medication Instructions:  Your physician recommends that you continue on your current medications as directed. Please refer to the Current Medication list given to you today.  *If you need a refill on your cardiac medications before your next appointment, please call your pharmacy*  Lab Work: NONE ordered at this time of appointment   If you have labs (blood work) drawn today and your tests are completely normal, you will receive your results only by: . MyChart Message (if you have MyChart) OR . A paper copy in the mail If you have any lab test that is abnormal or we need to change your treatment, we will call you to review the results.  Testing/Procedures: NONE ordered at this time of appointment   Follow-Up: At [facility], you and your health needs are our priority.  As part of our continuing mission to provide you with exceptional heart care, we have created designated Provider Care Teams.  These Care Teams include your primary Cardiologist (physician) and Advanced Practice Providers (APPs -  Physician Assistants and Nurse Practitioners) who all work together to provide you with the care you need, when you need it.  Your next appointment:   3 month(s)  The format for your next appointment:   In Person  Provider:   Tiffany Cliffdell, MD  Other Instructions   

## 2020-05-08 NOTE — Telephone Encounter (Signed)
Late entry --spoke with patient day she called in  [place] virtual visit not appropriate for post CABG visit  Seen in clinic today

## 2020-05-11 ENCOUNTER — Ambulatory Visit: Payer: Medicare HMO | Admitting: Cardiothoracic Surgery

## 2020-05-11 NOTE — Progress Notes (Signed)
[address]       ,[place] [address]             [facility] OFFICE NOTE  Referring Provider is Glendale Chard, MD Primary Cardiologist is Skeet Latch, MD PCP is Glendale Chard, MD   HPI:  75 year old lady presents for office follow-up status post CABG on 04/22/2020.  She has done well but did have an ER visit in [place] due to hypertension. She denies chest pain but has been troubled by some shortness of breath and allergy type symptoms.  She has a dry cough. She denies fevers or chills   Current Outpatient Medications  Medication Sig Dispense Refill  . acetaminophen (TYLENOL) 500 MG tablet Take 2 tablets (1,000 mg total) by mouth every 6 (six) hours as needed. 30 tablet 0  . aspirin 81 MG tablet Take 81 mg by mouth at bedtime.    . Cholecalciferol (VITAMIN D) 50 MCG (2000 UT) tablet Take 4,000 Units by mouth daily.    Marland Kitchen dextromethorphan-guaiFENesin (MUCINEX DM) 30-600 MG 12hr tablet Take 1 tablet by mouth 2 (two) times daily as needed for cough.    . metoprolol tartrate (LOPRESSOR) 25 MG tablet Take 1 tablet (25 mg total) by mouth 2 (two) times daily. 60 tablet 3  . Multiple Minerals-Vitamins (GNP CAL MAG ZINC +D3 PO) Take 1 tablet by mouth in the morning and at bedtime.    . NONI, MORINDA CITRIFOLIA, PO Take 600 mg by mouth 2 (two) times a week.    . Olopatadine HCl 0.2 % SOLN Place 1 drop into both eyes daily as needed (itchy eyes).    Marland Kitchen OVER THE COUNTER MEDICATION Take 1 capsule by mouth in the morning and at bedtime. [place]    . oxyCODONE (OXY IR/ROXICODONE) 5 MG immediate release tablet Take 1 tablet (5 mg total) by mouth every 4 (four) hours as needed for severe pain. 30 tablet 0  . rosuvastatin (CRESTOR) 5 MG tablet Take 1 tablet (5 mg total) by mouth daily. 30 tablet 3  . Ipratropium-Albuterol (COMBIVENT) 20-100 MCG/ACT AERS respimat Inhale 1 puff into the lungs every 6 (six) hours as needed for wheezing. 1 each  0   No current facility-administered medications for this visit.      Physical Exam:   BP 137/83 (BP Location: Left Arm, Patient Position: Sitting)   Pulse 75   Temp (!) 97.3 F (36.3 C)   Resp 20   Ht 5\' 1"  (1.549 m)   Wt 55.3 kg   SpO2 93%   BMI 23.05 kg/m   General:  Well-appearing no acute distress  Chest:   Clear to auscultation bilaterally  CV:   Regular rate and rhythm  Incisions:  Well-healed; the right forearm staples are removed and the wound is Steri-Stripped  Abdomen:  Soft nontender  Extremities:  No edema  Diagnostic Tests:  chest x-ray shows clear lung fields with very small bilateral pleural effusions.  Stable mediastinal silhouette   Impression:  Doing well after CABG  Plan:  Follow-up with Dr. Oval Linsey  Follow-up with thoracic surgery as needed  I spent in excess of 15 minutes during the conduct of this office consultation and >50% of this time involved direct face-to-face encounter with the patient for counseling and/or coordination of their care.  Level 2                 10 minutes Level 3  15 minutes Level 4                 25 minutes Level 5                 40 minutes  B.  Murvin Natal, MD 05/11/2020 10:27 AM

## 2020-05-12 ENCOUNTER — Ambulatory Visit: Payer: Medicare HMO | Admitting: Internal Medicine

## 2020-05-19 ENCOUNTER — Ambulatory Visit: Payer: Medicare HMO | Admitting: Internal Medicine

## 2020-05-29 ENCOUNTER — Other Ambulatory Visit: Payer: Self-pay | Admitting: Cardiothoracic Surgery

## 2020-06-03 ENCOUNTER — Encounter: Payer: Self-pay | Admitting: Internal Medicine

## 2020-06-04 ENCOUNTER — Other Ambulatory Visit: Payer: Self-pay | Admitting: Internal Medicine

## 2020-06-04 MED ORDER — BENZONATATE 100 MG PO CAPS: 100.0000 mg | ORAL_CAPSULE | Freq: Three times a day (TID) | ORAL | 1 refills | Status: DC | PRN

## 2020-06-06 NOTE — Progress Notes (Signed)
Cardiology Office Note:    Date:  06/16/2020   ID:  Virginia Delgado, DOB 11/13/1945, MRN [id number]  PCP:  Glendale Chard, MD  Cardiologist:  Skeet Latch, MD  Electrophysiologist:  None   Referring MD: Glendale Chard, MD   Chief Complaint: hospital follow-up of CAD  History of Present Illness:    Virginia Delgado is a 75 y.o. female with a history of CAD s/p CABG x4 (LIMA to LAD, RIMA to left PDA, sequential radial artery to OM and ramus intermedius) in 03/2020, hypertension, hyperlipidemia, and Hepatitis C who is followed by Dr. Oval Linsey and presents today for follow-up of CAD.  Patient underwent cardiac catheterization in 03/2020 for further evaluation of exertional chest pain which showed severe 3 vessel CAD including ostial LAD. Echo showed LVEF of 65-70% with normal wall motion and mild AI. CT surgery was consulted and patient underwent CABG x 4 (LIMA to LAD, RIMA to left PDA, sequential radial artery to OM and ramus intermedius) on 04/22/2020. Post-op course was unremarkable. She was started on IV Nitro following surgery and then transitioned to Isordil given radial artery graft. She maintained sinus rhythm during post-op period. She was seen by CT surgery for follow-up on 04/26/2020 at which time she reported a persistent non-productive cough. She was started on Mucinex to help facilitate better cough and loosen up sputum.   Patient was last seen by Jory Sims, NP, on 05/08/2020 at which time she reported generalized pain in her chest and continued to have  non-productive cough as well as generalized weakness. She was also have a lot to pain around incision sites and with deep breathing an coughing. She was seen back by CT surgery later that day and she was felt to overall be recovering well. Chest x-ray showed clear lung fields with only very small bilateral pleural effusion.  Patient presents today for follow-up. Here with granddaughter. PCP prescribed Tessalon Perles for her cough which  has helped a lot. She is still coughing some but overall doing much better. Breathing has also improved with improvement in cough. No chest pain. She does note some upper and lower left abdominal discomfort that she describes as a "fluttering" sensation. She initially attributed this to coughing so much but yesterday she went to her first session of Cardiac Rehab and it seemed to coincide with PAC/PVCs on the monitor. No palpitations in her chest. However, she does notes lightheadedness/dizziness not related to position changes. She states sometimes it last for hours and she cannot do anything. Sometimes related to fluttering sensation. No syncope. No orthopnea, PND, or edema.  Family also worried that she is not eating as much. Patient feels like she is eating fine. Family has also noticed that she gets intermittent bilateral hand tremors and as well as a tremor in her lips. No other stroke like symptom   Past Medical History:  Diagnosis Date  . Coronary artery calcification 04/05/2019   Calcium score 93rd percentile 08/2018.  Marland Kitchen Dermatitis   . Hx of hepatitis C   . Hypertension     Past Surgical History:  Procedure Laterality Date  . BUNIONECTOMY Bilateral   . CESAREAN SECTION  1976  . CESAREAN SECTION  1979  . CLIPPING OF ATRIAL APPENDAGE Left 04/22/2020   Procedure: CLIPPING OF  LEFT ATRIAL APPENDAGE USING ATRICURE ATRICLIPFLEX-V 35;  Surgeon: Wonda Olds, MD;  Location: [facility];  Service: Open Heart Surgery;  Laterality: Left;  . CORONARY ARTERY BYPASS GRAFT N/A 04/22/2020   Procedure: CORONARY ARTERY  BYPASS GRAFTING (CABGX4). USING BILATERAL MAMMARIES;  Surgeon: Wonda Olds, MD;  Location: [facility];  Service: Open Heart Surgery;  Laterality: N/A;  BIMA  . LEFT HEART CATH AND CORONARY ANGIOGRAPHY N/A 04/20/2020   Procedure: LEFT HEART CATH AND CORONARY ANGIOGRAPHY;  Surgeon: Nelva Bush, MD;  Location: [facility];  Service: Cardiovascular;  Laterality: N/A;  . RADIAL ARTERY  HARVEST Right 04/22/2020   Procedure: RIGHT RADIAL ARTERY HARVESTING.;  Surgeon: Wonda Olds, MD;  Location: [facility];  Service: Open Heart Surgery;  Laterality: Right;  . TEE WITHOUT CARDIOVERSION N/A 04/22/2020   Procedure: TRANSESOPHAGEAL ECHOCARDIOGRAM (TEE);  Surgeon: Wonda Olds, MD;  Location: [facility];  Service: Open Heart Surgery;  Laterality: N/A;  . UMBILICAL HERNIA REPAIR  1981    Current Medications: Current Meds  Medication Sig  . acetaminophen (TYLENOL) 500 MG tablet Take 2 tablets (1,000 mg total) by mouth every 6 (six) hours as needed.  Marland Kitchen aspirin 81 MG tablet Take 81 mg by mouth at bedtime.  . benzonatate (TESSALON PERLES) 100 MG capsule Take 1 capsule (100 mg total) by mouth 3 (three) times daily as needed for cough.  . Cholecalciferol (VITAMIN D) 50 MCG (2000 UT) tablet Take 4,000 Units by mouth daily.  . metoprolol tartrate (LOPRESSOR) 25 MG tablet Take 1 tablet (25 mg total) by mouth 2 (two) times daily.  . Multiple Minerals-Vitamins (GNP CAL MAG ZINC +D3 PO) Take 1 tablet by mouth in the morning and at bedtime.  . NONI, MORINDA CITRIFOLIA, PO Take 600 mg by mouth 2 (two) times a week.  . Olopatadine HCl 0.2 % SOLN Place 1 drop into both eyes daily as needed (itchy eyes).  Marland Kitchen OVER THE COUNTER MEDICATION Take 1 capsule by mouth in the morning and at bedtime. [place]  . [DISCONTINUED] COMBIVENT RESPIMAT 20-100 MCG/ACT AERS respimat INHALE 1 PUFF INTO THE LUNGS EVERY 6 HOURS AS NEEDED FOR WHEEZING.  . [DISCONTINUED] dextromethorphan-guaiFENesin (MUCINEX DM) 30-600 MG 12hr tablet Take 1 tablet by mouth 2 (two) times daily as needed for cough.  . [DISCONTINUED] oxyCODONE (OXY IR/ROXICODONE) 5 MG immediate release tablet Take 1 tablet (5 mg total) by mouth every 4 (four) hours as needed for severe pain.  . [DISCONTINUED] rosuvastatin (CRESTOR) 5 MG tablet Take 1 tablet (5 mg total) by mouth daily.     Allergies:   Contrast media [iodinated diagnostic agents], Gadobutrol,  Gadolinium derivatives, Lidocaine, Pravachol [pravastatin sodium], and Latex   Social History   Socioeconomic History  . Marital status: Divorced    Spouse name: Not on file  . Number of children: Not on file  . Years of education: Not on file  . Highest education level: Not on file  Occupational History  . Not on file  Tobacco Use  . Smoking status: Never Smoker  . Smokeless tobacco: Never Used  Vaping Use  . Vaping Use: Never used  Substance and Sexual Activity  . Alcohol use: Yes    Comment: social  . Drug use: No  . Sexual activity: Yes    Birth control/protection: None  Other Topics Concern  . Not on file  Social History Narrative  . Not on file   Social Determinants of Health   Financial Resource Strain: Medium Risk  . Difficulty of Paying Living Expenses: Somewhat hard  Food Insecurity: No Food Insecurity  . Worried About Charity fundraiser in the Last Year: Never true  . Ran Out of Food in the Last Year:  Never true  Transportation Needs: No Transportation Needs  . Lack of Transportation (Medical): No  . Lack of Transportation (Non-Medical): No  Physical Activity: Inactive  . Days of Exercise per Week: 0 days  . Minutes of Exercise per Session: 0 min  Stress: No Stress Concern Present  . Feeling of Stress : Not at all  Social Connections: Not on file     Family History: The patient's family history includes COPD in her mother; Cancer in her mother and sister; Heart disease in her father; Hypertension in her brother and sister; Ovarian cancer in her maternal grandmother; Rheum arthritis in her paternal grandmother.  ROS:   Please see the history of present illness.     EKGs/Labs/Other Studies Reviewed:    The following studies were reviewed today:  Left Cardiac Catheterization 04/20/2020: Conclusions: 1. Severe three-vessel coronary artery disease, including 80-90% ostial/proximal LAD stenosis, 70-90% lesions of ostial/proximal D1, D2, and ramus  intermedius, and diffuse mid/distal LCx disease of up to 80% (dominant LCx).  90% stenosis of nondominant RCA is also present. 2. Normal left ventricular contraction with mildly elevated filling pressure.  Recommendations: 1. Given significant three-vessel disease, including ostial LAD involvement, recommend cardiac surgery consultation for CABG.  Given rapid progression of symptoms (often present at rest), patient will be admitted for inpatient surgical evaluation. 2. Aggressive secondary prevention, including addition of statin. 3. Obtain echocardiogram. _______________  Echocardiogram 04/20/2020: Impressions: 1. Left ventricular ejection fraction, by estimation, is 65 to 70%. The  left ventricle has normal function. The left ventricle has no regional  wall motion abnormalities. Left ventricular diastolic parameters are  indeterminate.  2. Right ventricular systolic function is normal. The right ventricular  size is normal. There is normal pulmonary artery systolic pressure.  3. The mitral valve is normal in structure. Mild mitral valve  regurgitation.  4. The aortic valve is tricuspid. Aortic valve regurgitation is mild.  Mild to moderate aortic valve sclerosis/calcification is present, without  any evidence of aortic stenosis.  5. The inferior vena cava is normal in size with greater than 50%  respiratory variability, suggesting right atrial pressure of 3 mmHg.   EKG:  EKG ordered today. EKG personally reviewed and demonstrates: Normal sinus rhythm with PVCs and T wave inversions in inferior leads.   Recent Labs: 04/13/2020: ALT 12 04/17/2020: TSH 1.290 04/23/2020: Magnesium 2.3 04/26/2020: BUN 12; Creatinine, Ser 0.87; Hemoglobin 10.5; Platelets 158; Potassium 3.5; Sodium 135  Recent Lipid Panel    Component Value Date/Time   CHOL 219 (H) 04/13/2020 0929   TRIG 68 04/13/2020 0929   HDL 82 04/13/2020 0929   CHOLHDL 2.7 04/13/2020 0929   LDLCALC 125 (H) 04/13/2020 0929     Physical Exam:    Vital Signs: BP (!) 166/68   Pulse 81   Ht 5' 0.25" (1.53 m)   Wt 120 lb 9.6 oz (54.7 kg)   SpO2 97%   BMI 23.36 kg/m     Wt Readings from Last 3 Encounters:  06/16/20 120 lb 9.6 oz (54.7 kg)  05/08/20 122 lb (55.3 kg)  05/08/20 122 lb 9.6 oz (55.6 kg)     General: 75 y.o. female in no acute distress. HEENT: Normocephalic and atraumatic. Sclera clear.  Neck: Supple. No JVD. Heart: Irregular with normal rate. Distinct S1 and S2. No significant murmurs, gallops, or rubs. Lungs: No increased work of breathing. Clear to ausculation bilaterally. No wheezes, rhonchi, or rales.  Abdomen: Soft, non-distended, and non-tender to palpation.  MSK: Normal strength  and tone for age. Extremities: No lower extremity edema.    Skin: Warm and dry. Neuro: Alert and oriented x3. No focal deficits. Psych: Normal affect. Responds appropriately.  Assessment:    1. Coronary artery disease involving native coronary artery of native heart without angina pectoris   2. S/P CABG x 4   3. Primary hypertension   4. Hyperlipidemia, unspecified hyperlipidemia type   5. Dizziness   6. Medication management     Plan:    CAD s/p CABG - S/p CABG x4 in 03/2020.  - No angina.  - Continue aspirin, beta-blocker, and statin.  Dizziness/Lightheadedness - Patient reports episodes of lightheadedness/dizziness that sometimes last for a couple of hours at a time. Sometimes related to fluttering sensation in her abdomen. She does have some PVC noted on EKG today.  - Will get 1 week Zio monitor to rule out NSVT or atrial fibrillation.  PVCs - Noted on EKG and rhythm strip today. She also notes abdominal fluttering but not palpitations in her chest.  - Continue Lopressor 25mg  twice daily for now. - Will check BMET, Magnesium, and TSH.  Hypertension - BP elevated 166/68 today in the office. - Continue Lopressor 25mg  twice daily. - Patient notes feeling very lightheadedness/dizzy lately  so I don't want to adjust medications quite yet. Will have patient keep a log of her BP and HR for 2-3 weeks and then send [place] a MyChart message with this information.  Hyperlipidemia - Lipid panel in 03/2020: Total Cholesterol 219, Triglycerides 68, HDL 82, LDL 125. - LDL goal <70 given CAD. - Patient has not been taking Crestor for about 1 month (she states she was told she could stop this). Advised patient to restart this. Will restart 5mg  daily which she was taking before. I would ideally like her to be on a higher dose but she states she was having myalgias when she was on it before. Therefore, will see how she does on 5mg  daily and can repeat lipid panel and LFTs in about 2 months.  Persistent Cough - Improved after PCP prescribed Tessalon Perles.  Hand/Lip Tremor - Family expressed concern of bilateral hand tremor as well as lip tremor. No other stroke like symptoms.  - Will check routine labs today. However, I don't think this is necessary a cardiac problem. Advised discussing this with PCP. She has appointment with PCP tomorrow.  Disposition: Follow up in 1 month.   Medication Adjustments/Labs and Tests Ordered: Current medicines are reviewed at length with the patient today.  Concerns regarding medicines are outlined above.  No orders of the defined types were placed in this encounter.  No orders of the defined types were placed in this encounter.   Patient Instructions  Medication Instructions:  RESTART- Crestor 5 mg by mouth daily  *If you need a refill on your cardiac medications before your next appointment, please call your pharmacy*   Lab Work: CBC and BMP Today  If you have labs (blood work) drawn today and your tests are completely normal, you will receive your results only by: Marland Kitchen MyChart Message (if you have MyChart) OR . A paper copy in the mail If you have any lab test that is abnormal or we need to change your treatment, we will call you to review the  results.   Testing/Procedures: Your physician has recommended that you wear a 7 days Zio monitor. Holter monitors are medical devices that record the heart's electrical activity. Doctors most often use these monitors to diagnose  arrhythmias. Arrhythmias are problems with the speed or rhythm of the heartbeat. The monitor is a small, portable device. You can wear one while you do your normal daily activities. This is usually used to diagnose what is causing palpitations/syncope (passing out).  Follow-Up: At [facility], you and your health needs are our priority.  As part of our continuing mission to provide you with exceptional heart care, we have created designated Provider Care Teams.  These Care Teams include your primary Cardiologist (physician) and Advanced Practice Providers (APPs -  Physician Assistants and Nurse Practitioners) who all work together to provide you with the care you need, when you need it.  We recommend signing up for the patient portal called "MyChart".  Sign up information is provided on this After Visit Summary.  MyChart is used to connect with patients for Virtual Visits (Telemedicine).  Patients are able to view lab/test results, encounter notes, upcoming appointments, etc.  Non-urgent messages can be sent to your provider as well.   To learn more about what you can do with MyChart, go to [email or web address].    Your next appointment:   1 month(s)  The format for your next appointment:   In Person  Provider:   You may see Skeet Latch, MD or one of the following Advanced Practice Providers on your designated Care Team:    Sande Rives, PA-C  Coletta Memos, FNP    Other Instructions  [place] Monitor Instructions   Your physician has requested you wear a ZIO patch monitor for 7  days.  This is a single patch monitor.   [employer] supplies one patch monitor per enrollment. Additional stickers are not available. Please do not apply patch if  you will be having a Nuclear Stress Test, Echocardiogram, Cardiac CT, MRI, or Chest Xray during the period you would be wearing the monitor. The patch cannot be worn during these tests. You cannot remove and re-apply the ZIO XT patch monitor.  Your ZIO patch monitor will be sent Fed Ex from [employer] directly to your home address. It may take 3-5 days to receive your monitor after you have been enrolled.  Once you have received your monitor, please review the enclosed instructions. Your monitor has already been registered assigning a specific monitor serial # to you.  Billing and Patient Assistance Program Information   We have supplied [employer] with any of your insurance information on file for billing purposes. [employer] offers a sliding scale Patient Assistance Program for patients that do not have insurance, or whose insurance does not completely cover the cost of the ZIO monitor.   You must apply for the Patient Assistance Program to qualify for this discounted rate.     To apply, please call [employer] at [phone], select option 4, then select option 2, and ask to apply for Patient Assistance Program.  Theodore Demark will ask your household income, and how many people are in your household.  They will quote your out-of-pocket cost based on that information.  [employer] will also be able to set up a 67-month, interest-free payment plan if needed.  Applying the monitor   Shave hair from upper left chest.  Hold abrader disc by orange tab. Rub abrader in 40 strokes over the upper left chest as indicated in your monitor instructions.  Clean area with 4 enclosed alcohol pads. Let dry.  Apply patch as indicated in monitor instructions. Patch will be placed under collarbone on left side of chest with arrow pointing upward.  Rub patch adhesive wings for 2 minutes. Remove white label marked "1". Remove the white label marked "2". Rub patch adhesive wings for 2 additional minutes.  While looking in a  mirror, press and release button in center of patch. A small green light will flash 3-4 times. This will be your only indicator that the monitor has been turned on. ?  Do not shower for the first 24 hours. You may shower after the first 24 hours.  Press the button if you feel a symptom. You will hear a small click. Record Date, Time and Symptom in the Patient Logbook.  When you are ready to remove the patch, follow instructions on the last 2 pages of the Patient Logbook. Stick patch monitor onto the last page of Patient Logbook.  Place Patient Logbook in the blue and white box.  Use locking tab on box and tape box closed securely.  The blue and white box has prepaid postage on it. Please place it in the mailbox as soon as possible. Your physician should have your test results approximately 7 days after the monitor has been mailed back to [facility].  Call [employer] at [phone] if you have questions regarding your ZIO XT patch monitor. Call them immediately if you see an orange light blinking on your monitor.  If your monitor falls off in less than 4 days, contact our Monitor department at [phone]. ?If your monitor becomes loose or falls off after 4 days call [employer] at [phone] for suggestions on securing your monitor.?      Liane Comber, PA-C  06/16/2020 9:11 AM    [place]

## 2020-06-15 DIAGNOSIS — Z48812 Encounter for surgical aftercare following surgery on the circulatory system: Secondary | ICD-10-CM | POA: Diagnosis not present

## 2020-06-15 DIAGNOSIS — Z951 Presence of aortocoronary bypass graft: Secondary | ICD-10-CM | POA: Diagnosis not present

## 2020-06-16 ENCOUNTER — Other Ambulatory Visit: Payer: Self-pay

## 2020-06-16 ENCOUNTER — Ambulatory Visit (INDEPENDENT_AMBULATORY_CARE_PROVIDER_SITE_OTHER): Payer: Medicare HMO

## 2020-06-16 ENCOUNTER — Encounter: Payer: Self-pay | Admitting: Radiology

## 2020-06-16 ENCOUNTER — Ambulatory Visit: Payer: Medicare HMO | Admitting: Student

## 2020-06-16 ENCOUNTER — Encounter: Payer: Self-pay | Admitting: Student

## 2020-06-16 VITALS — BP 166/68 | HR 81 | Ht 60.25 in | Wt 120.6 lb

## 2020-06-16 DIAGNOSIS — R42 Dizziness and giddiness: Secondary | ICD-10-CM

## 2020-06-16 DIAGNOSIS — I493 Ventricular premature depolarization: Secondary | ICD-10-CM

## 2020-06-16 DIAGNOSIS — I2510 Atherosclerotic heart disease of native coronary artery without angina pectoris: Secondary | ICD-10-CM

## 2020-06-16 DIAGNOSIS — Z79899 Other long term (current) drug therapy: Secondary | ICD-10-CM | POA: Diagnosis not present

## 2020-06-16 DIAGNOSIS — E785 Hyperlipidemia, unspecified: Secondary | ICD-10-CM

## 2020-06-16 DIAGNOSIS — R059 Cough, unspecified: Secondary | ICD-10-CM

## 2020-06-16 DIAGNOSIS — Z951 Presence of aortocoronary bypass graft: Secondary | ICD-10-CM | POA: Diagnosis not present

## 2020-06-16 DIAGNOSIS — I10 Essential (primary) hypertension: Secondary | ICD-10-CM

## 2020-06-16 LAB — BASIC METABOLIC PANEL WITH GFR
BUN/Creatinine Ratio: 9 — ABNORMAL LOW (ref 12–28)
BUN: 10 mg/dL (ref 8–27)
CO2: 25 mmol/L (ref 20–29)
Calcium: 11.1 mg/dL — ABNORMAL HIGH (ref 8.7–10.3)
Chloride: 102 mmol/L (ref 96–106)
Creatinine, Ser: 1.1 mg/dL — ABNORMAL HIGH (ref 0.57–1.00)
Glucose: 104 mg/dL — ABNORMAL HIGH (ref 65–99)
Potassium: 4.3 mmol/L (ref 3.5–5.2)
Sodium: 141 mmol/L (ref 134–144)
eGFR: 53 mL/min/1.73 — ABNORMAL LOW

## 2020-06-16 LAB — TSH: TSH: 1.25 u[IU]/mL (ref 0.450–4.500)

## 2020-06-16 LAB — CBC
Hematocrit: 41.4 % (ref 34.0–46.6)
Hemoglobin: 13.4 g/dL (ref 11.1–15.9)
MCH: 28.6 pg (ref 26.6–33.0)
MCHC: 32.4 g/dL (ref 31.5–35.7)
MCV: 89 fL (ref 79–97)
Platelets: 272 10*3/uL (ref 150–450)
RBC: 4.68 x10E6/uL (ref 3.77–5.28)
RDW: 12.2 % (ref 11.7–15.4)
WBC: 6.1 10*3/uL (ref 3.4–10.8)

## 2020-06-16 LAB — MAGNESIUM: Magnesium: 2.3 mg/dL (ref 1.6–2.3)

## 2020-06-16 MED ORDER — ROSUVASTATIN CALCIUM 5 MG PO TABS: 5.0000 mg | ORAL_TABLET | Freq: Every day | ORAL | 3 refills | Status: DC

## 2020-06-16 NOTE — Progress Notes (Signed)
Enrolled patient for a 7 day Zio XT Monitor to be mailed to patients home.  

## 2020-06-16 NOTE — Patient Instructions (Signed)
Medication Instructions:  RESTART- Crestor 5 mg by mouth daily  *If you need a refill on your cardiac medications before your next appointment, please call your pharmacy*   Lab Work: CBC and BMP Today  If you have labs (blood work) drawn today and your tests are completely normal, you will receive your results only by: Marland Kitchen MyChart Message (if you have MyChart) OR . A paper copy in the mail If you have any lab test that is abnormal or we need to change your treatment, we will call you to review the results.   Testing/Procedures: Your physician has recommended that you wear a 7 days Zio monitor. Holter monitors are medical devices that record the heart's electrical activity. Doctors most often use these monitors to diagnose arrhythmias. Arrhythmias are problems with the speed or rhythm of the heartbeat. The monitor is a small, portable device. You can wear one while you do your normal daily activities. This is usually used to diagnose what is causing palpitations/syncope (passing out).  Follow-Up: At [facility], you and your health needs are our priority.  As part of our continuing mission to provide you with exceptional heart care, we have created designated Provider Care Teams.  These Care Teams include your primary Cardiologist (physician) and Advanced Practice Providers (APPs -  Physician Assistants and Nurse Practitioners) who all work together to provide you with the care you need, when you need it.  We recommend signing up for the patient portal called "MyChart".  Sign up information is provided on this After Visit Summary.  MyChart is used to connect with patients for Virtual Visits (Telemedicine).  Patients are able to view lab/test results, encounter notes, upcoming appointments, etc.  Non-urgent messages can be sent to your provider as well.   To learn more about what you can do with MyChart, go to [email or web address].    Your next appointment:   1 month(s)  The format for  your next appointment:   In Person  Provider:   You may see Skeet Latch, MD or one of the following Advanced Practice Providers on your designated Care Team:    Sande Rives, PA-C  Coletta Memos, FNP    Other Instructions  [place] Monitor Instructions   Your physician has requested you wear a ZIO patch monitor for 7  days.  This is a single patch monitor.   [employer] supplies one patch monitor per enrollment. Additional stickers are not available. Please do not apply patch if you will be having a Nuclear Stress Test, Echocardiogram, Cardiac CT, MRI, or Chest Xray during the period you would be wearing the monitor. The patch cannot be worn during these tests. You cannot remove and re-apply the ZIO XT patch monitor.  Your ZIO patch monitor will be sent Fed Ex from [employer] directly to your home address. It may take 3-5 days to receive your monitor after you have been enrolled.  Once you have received your monitor, please review the enclosed instructions. Your monitor has already been registered assigning a specific monitor serial # to you.  Billing and Patient Assistance Program Information   We have supplied [employer] with any of your insurance information on file for billing purposes. [employer] offers a sliding scale Patient Assistance Program for patients that do not have insurance, or whose insurance does not completely cover the cost of the ZIO monitor.   You must apply for the Patient Assistance Program to qualify for this discounted rate.     To  apply, please call [employer] at [phone], select option 4, then select option 2, and ask to apply for Patient Assistance Program.  Theodore Demark will ask your household income, and how many people are in your household.  They will quote your out-of-pocket cost based on that information.  [employer] will also be able to set up a 3-month, interest-free payment plan if needed.  Applying the monitor   Shave hair from upper left  chest.  Hold abrader disc by orange tab. Rub abrader in 40 strokes over the upper left chest as indicated in your monitor instructions.  Clean area with 4 enclosed alcohol pads. Let dry.  Apply patch as indicated in monitor instructions. Patch will be placed under collarbone on left side of chest with arrow pointing upward.  Rub patch adhesive wings for 2 minutes. Remove white label marked "1". Remove the white label marked "2". Rub patch adhesive wings for 2 additional minutes.  While looking in a mirror, press and release button in center of patch. A small green light will flash 3-4 times. This will be your only indicator that the monitor has been turned on. ?  Do not shower for the first 24 hours. You may shower after the first 24 hours.  Press the button if you feel a symptom. You will hear a small click. Record Date, Time and Symptom in the Patient Logbook.  When you are ready to remove the patch, follow instructions on the last 2 pages of the Patient Logbook. Stick patch monitor onto the last page of Patient Logbook.  Place Patient Logbook in the blue and white box.  Use locking tab on box and tape box closed securely.  The blue and white box has prepaid postage on it. Please place it in the mailbox as soon as possible. Your physician should have your test results approximately 7 days after the monitor has been mailed back to [facility].  Call [place] at [phone] if you have questions regarding your ZIO XT patch monitor. Call them immediately if you see an orange light blinking on your monitor.  If your monitor falls off in less than 4 days, contact our Monitor department at [phone]. ?If your monitor becomes loose or falls off after 4 days call [employer] at [phone] for suggestions on securing your monitor.?

## 2020-06-18 DIAGNOSIS — R42 Dizziness and giddiness: Secondary | ICD-10-CM

## 2020-06-18 DIAGNOSIS — I493 Ventricular premature depolarization: Secondary | ICD-10-CM

## 2020-06-18 DIAGNOSIS — Z48812 Encounter for surgical aftercare following surgery on the circulatory system: Secondary | ICD-10-CM | POA: Diagnosis not present

## 2020-06-18 DIAGNOSIS — Z951 Presence of aortocoronary bypass graft: Secondary | ICD-10-CM | POA: Diagnosis not present

## 2020-06-22 DIAGNOSIS — Z951 Presence of aortocoronary bypass graft: Secondary | ICD-10-CM | POA: Diagnosis not present

## 2020-06-22 DIAGNOSIS — Z48812 Encounter for surgical aftercare following surgery on the circulatory system: Secondary | ICD-10-CM | POA: Diagnosis not present

## 2020-06-24 DIAGNOSIS — Z951 Presence of aortocoronary bypass graft: Secondary | ICD-10-CM | POA: Diagnosis not present

## 2020-06-24 DIAGNOSIS — Z48812 Encounter for surgical aftercare following surgery on the circulatory system: Secondary | ICD-10-CM | POA: Diagnosis not present

## 2020-06-25 ENCOUNTER — Encounter: Payer: Self-pay | Admitting: Internal Medicine

## 2020-06-25 DIAGNOSIS — Z951 Presence of aortocoronary bypass graft: Secondary | ICD-10-CM | POA: Diagnosis not present

## 2020-06-25 DIAGNOSIS — Z48812 Encounter for surgical aftercare following surgery on the circulatory system: Secondary | ICD-10-CM | POA: Diagnosis not present

## 2020-06-26 ENCOUNTER — Other Ambulatory Visit: Payer: Self-pay

## 2020-06-26 ENCOUNTER — Ambulatory Visit (INDEPENDENT_AMBULATORY_CARE_PROVIDER_SITE_OTHER): Payer: Medicare HMO | Admitting: Nurse Practitioner

## 2020-06-26 VITALS — BP 138/70 | HR 65 | Temp 97.7°F | Ht 60.2 in | Wt 120.2 lb

## 2020-06-26 DIAGNOSIS — N1831 Chronic kidney disease, stage 3a: Secondary | ICD-10-CM

## 2020-06-26 DIAGNOSIS — R350 Frequency of micturition: Secondary | ICD-10-CM | POA: Diagnosis not present

## 2020-06-26 LAB — POCT URINALYSIS DIPSTICK
Bilirubin, UA: NEGATIVE
Glucose, UA: NEGATIVE
Ketones, UA: NEGATIVE
Leukocytes, UA: NEGATIVE
Nitrite, UA: NEGATIVE
Protein, UA: POSITIVE — AB
Spec Grav, UA: 1.025 (ref 1.010–1.025)
Urobilinogen, UA: 0.2 E.U./dL
pH, UA: 5.5 (ref 5.0–8.0)

## 2020-06-26 NOTE — Patient Instructions (Signed)
Urinary Frequency, Adult Urinary frequency means urinating more often than usual. You may urinate every 1-2 hours even though you drink a normal amount of fluid and do not have a bladder infection or condition. Although you urinate more often than normal,the total amount of urine produced in a day is normal. With urinary frequency, you may have an urgent need to urinate often. The stress and anxiety of needing to find a bathroom quickly can make this urge worse. This condition may go away on its own or you may need treatment at home. Home treatment may include bladder training, exercises, taking medicines, ormaking changes to your diet. Follow these instructions at home: Bladder health  Keep a bladder diary if told by your health care provider. Keep track of: What you eat and drink. How often you urinate. How much you urinate. Follow a bladder training program if told by your health care provider. This may include: Learning to delay going to the bathroom. Double urinating (voiding). This helps if you are not completely emptying your bladder. Scheduled voiding. Do Kegel exercises as told by your health care provider. Kegel exercises strengthen the muscles that help control urination, which may help the condition.  Eating and drinking If told by your health care provider, make diet changes, such as: Avoiding caffeine. Drinking fewer fluids, especially alcohol. Not drinking in the evening. Avoiding foods or drinks that may irritate the bladder. These include coffee, tea, soda, artificial sweeteners, citrus, tomato-based foods, and chocolate. Eating foods that help prevent or ease constipation. Constipation can make this condition worse. Your health care provider may recommend that you: Drink enough fluid to keep your urine pale yellow. Take over-the-counter or prescription medicines. Eat foods that are high in fiber, such as beans, whole grains, and fresh fruits and vegetables. Limit foods  that are high in fat and processed sugars, such as fried or sweet foods. General instructions Take over-the-counter and prescription medicines only as told by your health care provider. Keep all follow-up visits as told by your health care provider. This is important. Contact a health care provider if: You start urinating more often. You feel pain or irritation when you urinate. You notice blood in your urine. Your urine looks cloudy. You develop a fever. You begin vomiting. Get help right away if: You are unable to urinate. Summary Urinary frequency means urinating more often than usual. With urinary frequency, you may urinate every 1-2 hours even though you drink a normal amount of fluid and do not have a bladder infection or other bladder condition. Your health care provider may recommend that you keep a bladder diary, follow a bladder training program, or make dietary changes. If told by your health care provider, do Kegel exercises to strengthen the muscles that help control urination. Take over-the-counter and prescription medicines only as told by your health care provider. Contact a health care provider if your symptoms do not improve or get worse. This information is not intended to replace advice given to you by your health care provider. Make sure you discuss any questions you have with your healthcare provider. Document Revised: 08/24/2017 Document Reviewed: 08/24/2017 Elsevier Patient Education  2021 Elsevier Inc.  

## 2020-06-26 NOTE — Progress Notes (Signed)
I,Tianna Badgett,acting as a Education administrator for [employer], NP.,have documented all relevant documentation on the behalf of [employer], NP,as directed by  Bary Castilla, NP while in the presence of Bary Castilla, NP.  This visit occurred during the SARS-CoV-2 public health emergency.  Safety protocols were in place, including screening questions prior to the visit, additional usage of staff PPE, and extensive cleaning of exam room while observing appropriate contact time as indicated for disinfecting solutions.  Subjective:     Patient ID: Virginia Delgado , female    DOB: 10-Jan-1946 , 75 y.o.   MRN: [id number]   Chief Complaint  Patient presents with  . Urinary Frequency    HPI  Patient is here for urinary frequency. She says that she was looking back through her mychart and noticed that she had a previous dx of CKD stage 2 and it bothered her. With the frequency of urination should would like to make sure her kidney function is not getting worse. She had a heart corollary bypass surgery recently.  She is being followed by cardiologist.  At night she is waking up 5 times a week to use the bathroom.  Her calcium was also elevated with recent blood work. Will work her up with labs and if kidney function is declining, will refer to nephrology after looking at her results.   Urinary Frequency  Associated symptoms include frequency. Pertinent negatives include no chills, flank pain or urgency.     Past Medical History:  Diagnosis Date  . Coronary artery calcification 04/05/2019   Calcium score 93rd percentile 08/2018.  Marland Kitchen Dermatitis   . Hx of hepatitis C   . Hypertension      Family History  Problem Relation Age of Onset  . Cancer Mother        breast  . COPD Mother   . Heart disease Father   . Cancer Sister        sarcoma  . Hypertension Brother   . Ovarian cancer Maternal Grandmother   . Rheum arthritis Paternal Grandmother   . Hypertension Sister      Current  Outpatient Medications:  .  acetaminophen (TYLENOL) 500 MG tablet, Take 2 tablets (1,000 mg total) by mouth every 6 (six) hours as needed., Disp: 30 tablet, Rfl: 0 .  aspirin 81 MG tablet, Take 81 mg by mouth at bedtime., Disp: , Rfl:  .  Cholecalciferol (VITAMIN D) 50 MCG (2000 UT) tablet, Take 4,000 Units by mouth daily., Disp: , Rfl:  .  metoprolol tartrate (LOPRESSOR) 25 MG tablet, Take 1 tablet (25 mg total) by mouth 2 (two) times daily., Disp: 60 tablet, Rfl: 3 .  Multiple Minerals-Vitamins (GNP CAL MAG ZINC +D3 PO), Take 1 tablet by mouth in the morning and at bedtime., Disp: , Rfl:  .  NONI, MORINDA CITRIFOLIA, PO, Take 600 mg by mouth 2 (two) times a week., Disp: , Rfl:  .  Olopatadine HCl 0.2 % SOLN, Place 1 drop into both eyes daily as needed (itchy eyes)., Disp: , Rfl:  .  OVER THE COUNTER MEDICATION, Take 1 capsule by mouth in the morning and at bedtime. [place], Disp: , Rfl:  .  rosuvastatin (CRESTOR) 5 MG tablet, Take 1 tablet (5 mg total) by mouth daily., Disp: 90 tablet, Rfl: 3   Allergies  Allergen Reactions  . Contrast Media [Iodinated Diagnostic Agents] Swelling    Of face, especially eyes  . Gadobutrol Other (See Comments)    Felt extremities go numb briefly as  contrast injected.  Had cough (bronchospasm?) briefly after scan.  Resolved on own without treatment.  07/13/15  . Gadolinium Derivatives Other (See Comments)    Felt extremities go numb briefly as contrast injected.  Had cough (bronchospasm?) briefly after scan.  Resolved on own without treatment.  07/13/15  . Lidocaine Other (See Comments)    Facial swelling after dental procedure  . Pravachol [Pravastatin Sodium]   . Latex Rash and Other (See Comments)    and irritation     Review of Systems  Constitutional: Negative for chills, fatigue and fever.  Respiratory: Negative for shortness of breath and wheezing.   Genitourinary: Positive for frequency. Negative for flank pain and urgency.     Today's Vitals    06/26/20 0902  BP: 138/70  Pulse: 65  Temp: 97.7 F (36.5 C)  TempSrc: Oral  Weight: 120 lb 3.2 oz (54.5 kg)  Height: 5' 0.2" (1.529 m)   Body mass index is 23.32 kg/m.  Wt Readings from Last 3 Encounters:  06/26/20 120 lb 3.2 oz (54.5 kg)  06/16/20 120 lb 9.6 oz (54.7 kg)  05/08/20 122 lb (55.3 kg)    Objective:  Physical Exam Constitutional:      Appearance: Normal appearance.  Cardiovascular:     Rate and Rhythm: Normal rate and regular rhythm.     Pulses: Normal pulses.     Heart sounds: Normal heart sounds.  Pulmonary:     Effort: Pulmonary effort is normal. No respiratory distress.     Breath sounds: Normal breath sounds. No wheezing.  Skin:    General: Skin is warm and dry.     Capillary Refill: Capillary refill takes less than 2 seconds.  Neurological:     General: No focal deficit present.     Mental Status: She is alert and oriented to person, place, and time.         Assessment And Plan:     1. Urinary frequency -UA and blood work; if GFR declining will send to nephrologist for further work up.  - POCT Urinalysis Dipstick (81002) - BMP8+EGFR - CBC no Diff   2. Serum calcium elevated -Will check PTH, TSH and vitamin D for any parathyroid abnormalities.  - PTH, Intact and Calcium - TSH + free T4 - Vitamin D (25 hydroxy)   3. Stage 3a Chronic Kidney disease (Echo)  -BMP8+EGFR -CBC no diff  -POCT urinalysis Dipstick (83151)  -Referral to nephrology sent   Patient was given opportunity to ask questions. Patient verbalized understanding of the plan and was able to repeat key elements of the plan. All questions were answered to their satisfaction.  Bary Castilla, DNP   I, Bary Castilla, DNP  have reviewed all documentation for this visit. The documentation on 06/25/20 for the exam, diagnosis, procedures, and orders are all accurate and complete.    IF YOU HAVE BEEN REFERRED TO A SPECIALIST, IT MAY TAKE 1-2 WEEKS TO SCHEDULE/PROCESS THE  REFERRAL. IF YOU HAVE NOT HEARD FROM US/SPECIALIST IN TWO WEEKS, PLEASE GIVE [place] A CALL AT [phone].   THE PATIENT IS ENCOURAGED TO PRACTICE SOCIAL DISTANCING DUE TO THE COVID-19 PANDEMIC.

## 2020-06-27 LAB — TSH+FREE T4
Free T4: 1.32 ng/dL (ref 0.82–1.77)
TSH: 1.09 u[IU]/mL (ref 0.450–4.500)

## 2020-06-27 LAB — CBC
Hematocrit: 40.2 % (ref 34.0–46.6)
Hemoglobin: 13.3 g/dL (ref 11.1–15.9)
MCH: 29.2 pg (ref 26.6–33.0)
MCHC: 33.1 g/dL (ref 31.5–35.7)
MCV: 88 fL (ref 79–97)
Platelets: 231 10*3/uL (ref 150–450)
RBC: 4.55 x10E6/uL (ref 3.77–5.28)
RDW: 12 % (ref 11.7–15.4)
WBC: 5.8 10*3/uL (ref 3.4–10.8)

## 2020-06-27 LAB — BMP8+EGFR
BUN/Creatinine Ratio: 11 — ABNORMAL LOW (ref 12–28)
BUN: 11 mg/dL (ref 8–27)
CO2: 24 mmol/L (ref 20–29)
Calcium: 10.7 mg/dL — ABNORMAL HIGH (ref 8.7–10.3)
Chloride: 107 mmol/L — ABNORMAL HIGH (ref 96–106)
Creatinine, Ser: 1.04 mg/dL — ABNORMAL HIGH (ref 0.57–1.00)
Glucose: 87 mg/dL (ref 65–99)
Potassium: 4.4 mmol/L (ref 3.5–5.2)
Sodium: 145 mmol/L — ABNORMAL HIGH (ref 134–144)
eGFR: 57 mL/min/{1.73_m2} — ABNORMAL LOW (ref >59)

## 2020-06-27 LAB — VITAMIN D 25 HYDROXY (VIT D DEFICIENCY, FRACTURES): Vit D, 25-Hydroxy: 31.4 ng/mL (ref 30.0–100.0)

## 2020-06-27 LAB — PTH, INTACT AND CALCIUM: PTH: 49 pg/mL (ref 15–65)

## 2020-06-29 DIAGNOSIS — Z951 Presence of aortocoronary bypass graft: Secondary | ICD-10-CM | POA: Diagnosis not present

## 2020-06-29 DIAGNOSIS — Z48812 Encounter for surgical aftercare following surgery on the circulatory system: Secondary | ICD-10-CM | POA: Diagnosis not present

## 2020-07-01 DIAGNOSIS — I493 Ventricular premature depolarization: Secondary | ICD-10-CM | POA: Diagnosis not present

## 2020-07-01 DIAGNOSIS — Z951 Presence of aortocoronary bypass graft: Secondary | ICD-10-CM | POA: Diagnosis not present

## 2020-07-01 DIAGNOSIS — R42 Dizziness and giddiness: Secondary | ICD-10-CM | POA: Diagnosis not present

## 2020-07-01 DIAGNOSIS — Z48812 Encounter for surgical aftercare following surgery on the circulatory system: Secondary | ICD-10-CM | POA: Diagnosis not present

## 2020-07-02 DIAGNOSIS — Z48812 Encounter for surgical aftercare following surgery on the circulatory system: Secondary | ICD-10-CM | POA: Diagnosis not present

## 2020-07-02 DIAGNOSIS — Z951 Presence of aortocoronary bypass graft: Secondary | ICD-10-CM | POA: Diagnosis not present

## 2020-07-08 DIAGNOSIS — Z951 Presence of aortocoronary bypass graft: Secondary | ICD-10-CM | POA: Diagnosis not present

## 2020-07-08 DIAGNOSIS — Z48812 Encounter for surgical aftercare following surgery on the circulatory system: Secondary | ICD-10-CM | POA: Diagnosis not present

## 2020-07-09 DIAGNOSIS — Z48812 Encounter for surgical aftercare following surgery on the circulatory system: Secondary | ICD-10-CM | POA: Diagnosis not present

## 2020-07-09 DIAGNOSIS — Z951 Presence of aortocoronary bypass graft: Secondary | ICD-10-CM | POA: Diagnosis not present

## 2020-07-13 DIAGNOSIS — Z48812 Encounter for surgical aftercare following surgery on the circulatory system: Secondary | ICD-10-CM | POA: Diagnosis not present

## 2020-07-13 DIAGNOSIS — Z951 Presence of aortocoronary bypass graft: Secondary | ICD-10-CM | POA: Diagnosis not present

## 2020-07-14 NOTE — Progress Notes (Signed)
Cardiology Clinic Note   Patient Name: Virginia Delgado Date of Encounter: 07/15/2020  Primary Care Provider:  Glendale Chard, MD Primary Cardiologist:  Virginia Latch, MD  Patient Profile    Virginia Delgado 75 year old female presents the clinic today for follow-up evaluation of her hypertension and coronary artery disease.  Past Medical History    Past Medical History:  Diagnosis Date  . Coronary artery calcification 04/05/2019   Calcium score 93rd percentile 08/2018.  Marland Kitchen Dermatitis   . Hx of hepatitis C   . Hypertension    Past Surgical History:  Procedure Laterality Date  . BUNIONECTOMY Bilateral   . CESAREAN SECTION  1976  . CESAREAN SECTION  1979  . CLIPPING OF ATRIAL APPENDAGE Left 04/22/2020   Procedure: CLIPPING OF  LEFT ATRIAL APPENDAGE USING ATRICURE ATRICLIPFLEX-V 35;  Surgeon: Virginia Olds, MD;  Location: [place];  Service: Open Heart Surgery;  Laterality: Left;  . CORONARY ARTERY BYPASS GRAFT N/A 04/22/2020   Procedure: CORONARY ARTERY BYPASS GRAFTING (CABGX4). USING BILATERAL MAMMARIES;  Surgeon: Virginia Olds, MD;  Location: [place];  Service: Open Heart Surgery;  Laterality: N/A;  BIMA  . LEFT HEART CATH AND CORONARY ANGIOGRAPHY N/A 04/20/2020   Procedure: LEFT HEART CATH AND CORONARY ANGIOGRAPHY;  Surgeon: Virginia Bush, MD;  Location: [place] [facility];  Service: Cardiovascular;  Laterality: N/A;  . RADIAL ARTERY HARVEST Right 04/22/2020   Procedure: RIGHT RADIAL ARTERY HARVESTING.;  Surgeon: Virginia Olds, MD;  Location: [place];  Service: Open Heart Surgery;  Laterality: Right;  . TEE WITHOUT CARDIOVERSION N/A 04/22/2020   Procedure: TRANSESOPHAGEAL ECHOCARDIOGRAM (TEE);  Surgeon: Virginia Olds, MD;  Location: [place];  Service: Open Heart Surgery;  Laterality: N/A;  . UMBILICAL HERNIA REPAIR  1981    Allergies  Allergies  Allergen Reactions  . Contrast Media [Iodinated Diagnostic Agents] Swelling    Of face, especially eyes  . Gadobutrol Other (See  Comments)    Felt extremities go numb briefly as contrast injected.  Had cough (bronchospasm?) briefly after scan.  Resolved on own without treatment.  07/13/15  . Gadolinium Derivatives Other (See Comments)    Felt extremities go numb briefly as contrast injected.  Had cough (bronchospasm?) briefly after scan.  Resolved on own without treatment.  07/13/15  . Lidocaine Other (See Comments)    Facial swelling after dental procedure  . Pravachol [Pravastatin Sodium]   . Latex Rash and Other (See Comments)    and irritation    History of Present Illness    Virginia Delgado has a PMH of hypertension, coronary artery disease status post CABG x4(LIMA to LAD, RIMA to left PDA, sequential radial artery to OM and ramus intermedius), hepatitis C, chronic renal disease stage II, hyperlipidemia, and statin intolerance.  She underwent cardiac catheterization 2/22 for evaluation of her chest pain.  That showed three-vessel coronary artery disease including ostial LAD.  Her echocardiogram at that time showed an LVEF of 65- 70% and mild AI.  CVTS was consulted and she underwent CABG x4 on 04/22/2020.  Her postoperative course was unremarkable.  She was started on IV nitroglycerin and transition to Isodril due to her renal artery graft.  She did maintain normal sinus rhythm during her postoperative course.  She was seen in follow-up by CVTS on 04/26/2020 and reported a persistent nonproductive cough.  She was started on Mucinex at that time.  She seen by Virginia Sims, DNP on 05/08/2020.  At that time she reported generalized pain in  her chest and a continued nonproductive cough.  She complained of increased incisional pain with deep breathing and coughing.  She was seen in follow-up by CVTS later that day and was felt to overall be recovering well.  Her chest x-ray showed clear lung fields with only small bilateral pleural effusions.  She was seen in follow-up by Virginia Rives, PA-C on 06/16/2020.  During that time she  presented with her granddaughter.  Her PCP had prescribed Tessalon Perles for her cough which has helped.  She continued to cough but overall it was much better.  Her breathing has also improved.  She denied chest pain.  She did report some lower abdomen discomfort which she described as "fluttering".  She had initially attributed this to her coughing.  However she had presented to cardiac rehab for her initial visit.  Her sensation coincided with PAC/PVCs on the monitor.  She reported no palpitations in her chest.  She did note lightheadedness/dizziness not related to position changes.  She reported that this would occasionally last for hours without relief.  She denied syncope, orthopnea, PND and edema.  Her family was worried that she was not eating much.  She felt like she was eating fine.  Family also noticed that she was getting intermittent bilateral hand tremors as well as tremor in her lips.  She was neurologically intact.  She presents the clinic today for follow-up evaluation states she has noticed increases in her blood pressure from cardiac rehab.  She also continues to notice intermittent episodes of PVCs.  We reviewed her cardiac event monitor PVCs and PACs.  She reports right leg and knee pain that she feels it is related to her rosuvastatin.  She previously noted left leg pain with statins.  We discussed how this is unusual and that it is only 1 leg that is affected and it may not be related to statin therapy.  I will have her hold her statin therapy for 1 week to see if her symptoms resolve.  We will start her on diltiazem 120 mg daily for her blood pressure and palpitations.  I will give her a blood pressure log, salty 6 diet sheet, have her continue her increased physical activity, and follow-up in 1 month.  Today she denies chest pain, shortness of breath, lower extremity edema, fatigue, palpitations, melena, hematuria, hemoptysis, diaphoresis, weakness, presyncope, syncope, orthopnea, and  PND.   Home Medications    Prior to Admission medications   Medication Sig Start Date End Date Taking? Authorizing Provider  acetaminophen (TYLENOL) 500 MG tablet Take 2 tablets (1,000 mg total) by mouth every 6 (six) hours as needed. 04/27/20   Barrett, Lodema Hong, PA-C  aspirin 81 MG tablet Take 81 mg by mouth at bedtime.    [provider]  Cholecalciferol (VITAMIN D) 50 MCG (2000 UT) tablet Take 4,000 Units by mouth daily.    [provider]  metoprolol tartrate (LOPRESSOR) 25 MG tablet Take 1 tablet (25 mg total) by mouth 2 (two) times daily. 04/27/20   Barrett, Erin R, PA-C  Multiple Minerals-Vitamins (GNP CAL MAG ZINC +D3 PO) Take 1 tablet by mouth in the morning and at bedtime.    [provider]  NONI, MORINDA CITRIFOLIA, PO Take 600 mg by mouth 2 (two) times a week.    [provider]  Olopatadine HCl 0.2 % SOLN Place 1 drop into both eyes daily as needed (itchy eyes).    [provider]  OVER THE COUNTER MEDICATION Take  1 capsule by mouth in the morning and at bedtime. [place]    [provider]  rosuvastatin (CRESTOR) 5 MG tablet Take 1 tablet (5 mg total) by mouth daily. 06/16/20   Darreld Mclean, PA-C    Family History    Family History  Problem Relation Age of Onset  . Cancer Mother        breast  . COPD Mother   . Heart disease Father   . Cancer Sister        sarcoma  . Hypertension Brother   . Ovarian cancer Maternal Grandmother   . Rheum arthritis Paternal Grandmother   . Hypertension Sister    She indicated that her mother is deceased. She indicated that her father is deceased. She indicated that only one of her two sisters is alive. She indicated that both of her brothers are alive. She indicated that her maternal grandmother is deceased. She indicated that her maternal grandfather is deceased. She indicated that her paternal grandmother is deceased.  Social History    Social History   Socioeconomic  History  . Marital status: Divorced    Spouse name: Not on file  . Number of children: Not on file  . Years of education: Not on file  . Highest education level: Not on file  Occupational History  . Not on file  Tobacco Use  . Smoking status: Never Smoker  . Smokeless tobacco: Never Used  Vaping Use  . Vaping Use: Never used  Substance and Sexual Activity  . Alcohol use: Yes    Comment: social  . Drug use: No  . Sexual activity: Yes    Birth control/protection: None  Other Topics Concern  . Not on file  Social History Narrative  . Not on file   Social Determinants of Health   Financial Resource Strain: Medium Risk  . Difficulty of Paying Living Expenses: Somewhat hard  Food Insecurity: No Food Insecurity  . Worried About Charity fundraiser in the Last Year: Never true  . Ran Out of Food in the Last Year: Never true  Transportation Needs: No Transportation Needs  . Lack of Transportation (Medical): No  . Lack of Transportation (Non-Medical): No  Physical Activity: Inactive  . Days of Exercise per Week: 0 days  . Minutes of Exercise per Session: 0 min  Stress: No Stress Concern Present  . Feeling of Stress : Not at all  Social Connections: Not on file  Intimate Partner Violence: Not on file     Review of Systems    General:  No chills, fever, night sweats or weight changes.  Cardiovascular:  No chest pain, dyspnea on exertion, edema, orthopnea, palpitations, paroxysmal nocturnal dyspnea. Dermatological: No rash, lesions/masses Respiratory: No cough, dyspnea Urologic: No hematuria, dysuria Abdominal:   No nausea, vomiting, diarrhea, bright red blood per rectum, melena, or hematemesis Neurologic:  No visual changes, wkns, changes in mental status. All other systems reviewed and are otherwise negative except as noted above.  Physical Exam    VS:  BP 140/90   Pulse 76   Ht 5' 0.25" (1.53 m)   Wt 124 lb (56.2 kg)   SpO2 96%   BMI 24.02 kg/m  , BMI Body mass  index is 24.02 kg/m. GEN: Well nourished, well developed, in no acute distress. HEENT: normal. Neck: Supple, no JVD, carotid bruits, or masses. Cardiac: RRR, no murmurs, rubs, or gallops. No clubbing, cyanosis, edema.  Radials/DP/PT 2+ and equal bilaterally.  Respiratory:  Respirations  regular and unlabored, clear to auscultation bilaterally. GI: Soft, nontender, nondistended, BS + x 4. MS: no deformity or atrophy. Skin: warm and dry, no rash. Neuro:  Strength and sensation are intact. Psych: Normal affect.  Accessory Clinical Findings    Recent Labs: 04/13/2020: ALT 12 06/16/2020: Magnesium 2.3 06/26/2020: BUN 11; Creatinine, Ser 1.04; Hemoglobin 13.3; Platelets 231; Potassium 4.4; Sodium 145; TSH 1.090   Recent Lipid Panel    Component Value Date/Time   CHOL 219 (H) 04/13/2020 0929   TRIG 68 04/13/2020 0929   HDL 82 04/13/2020 0929   CHOLHDL 2.7 04/13/2020 0929   LDLCALC 125 (H) 04/13/2020 0929    ECG personally reviewed by me today-none today.  EKG 06/16/2020 Normal sinus rhythm with PVCs and T wave inversion in inferior leads  Echocardiogram 04/20/2020 IMPRESSIONS    1. Left ventricular ejection fraction, by estimation, is 65 to 70%. The  left ventricle has normal function. The left ventricle has no regional  wall motion abnormalities. Left ventricular diastolic parameters are  indeterminate.  2. Right ventricular systolic function is normal. The right ventricular  size is normal. There is normal pulmonary artery systolic pressure.  3. The mitral valve is normal in structure. Mild mitral valve  regurgitation.  4. The aortic valve is tricuspid. Aortic valve regurgitation is mild.  Mild to moderate aortic valve sclerosis/calcification is present, without  any evidence of aortic stenosis.  5. The inferior vena cava is normal in size with greater than 50%  respiratory variability, suggesting right atrial pressure of 3 mmHg.  Cardiac catheterization  04/20/2020 Conclusions: 1. Severe three-vessel coronary artery disease, including 80-90% ostial/proximal LAD stenosis, 70-90% lesions of ostial/proximal D1, D2, and ramus intermedius, and diffuse mid/distal LCx disease of up to 80% (dominant LCx).  90% stenosis of nondominant RCA is also present. 2. Normal left ventricular contraction with mildly elevated filling pressure.  Recommendations: 1. Given significant three-vessel disease, including ostial LAD involvement, recommend cardiac surgery consultation for CABG.  Given rapid progression of symptoms (often present at rest), patient will be admitted for inpatient surgical evaluation. 2. Aggressive secondary prevention, including addition of statin. 3. Obtain echocardiogram.  Virginia Bush, MD [facility]  Diagnostic Dominance: Left    Intervention    Cardiac event monitor 07/01/2020 Patient had a min HR of 53 bpm, max HR of 190 bpm, and avg HR of 80 bpm. Predominant underlying rhythm was Sinus Rhythm. 13 Ventricular Tachycardia runs occurred, the run with the fastest interval lasting 4 beats with a max rate of 174 bpm, the longest  lasting 5 beats with an avg rate of 113 bpm. 57 Supraventricular Tachycardia runs occurred, the run with the fastest interval lasting 4 beats with a max rate of 190 bpm, the longest lasting 7 beats with an avg rate of 125 bpm. Isolated SVEs were  occasional (3.4%, 16109), SVE Couplets were rare (<1.0%, 1490), and SVE Triplets were rare (<1.0%, 84). Isolated VEs were frequent (15.3%, [id number]), VE Couplets were rare (<1.0%, 2302), and VE Triplets were rare (<1.0%, 380). Ventricular Bigeminy and  Trigeminy were present.     Assessment & Plan   1.  Essential hypertension-BP today 140/90.  Labile at home Continue metoprolol, start diltiazem 120 mg daily Heart healthy low-sodium diet-salty 6 given Increase physical activity as tolerated Maintain blood pressure log  Dizziness/lightheadedness- none today.   Cardiac event monitor showed predominantly sinus rhythm.  13 beats of VT, 57 SVT runs, isolated SVE's, ventricular bigeminal and trigeminy. Start Cardizem 120 mg extended release  daily Continue metoprolol Start diltiazem  Coronary artery disease- no chest pain today.  Status post CABG x4 on 2/22.  Details above. Continue aspirin, rosuvastatin, metoprolol Heart healthy low-sodium diet-salty 6 given Increase physical activity as tolerated  Hyperlipidemia-04/13/2020: Cholesterol, Total 219; HDL 82; LDL Chol Calc (NIH) 125; Triglycerides 68.  Reports right leg pain appears to be unrelated to statin therapy. Continue rosuvastatin- hold for 1 week to trial off therapy.  Instructed to resume if neck discomfort is still present. Heart healthy low-sodium high-fiber diet Increase physical activity as tolerated  PVCs- lab work unremarkable. Continue to monitor. Continue metoprolol   Disposition: Follow-up with Dr. Oval Linsey or Virginia Rives, PA-C in 1 month.    Jossie Ng. Kemia Wendel NP-C    07/15/2020, 9:51 AM [address] Office [phone] Fax [phone]  Notice: This dictation was prepared with Dragon dictation along with smaller phrase technology. Any transcriptional errors that result from this process are unintentional and may not be corrected upon review.  I spent 12 minutes examining this patient, reviewing medications, and using patient centered shared decision making involving her cardiac care.  Prior to her visit I spent greater than 20 minutes reviewing her past medical history,  medications, and prior cardiac tests.

## 2020-07-15 ENCOUNTER — Ambulatory Visit: Payer: Medicare HMO | Admitting: General Practice

## 2020-07-15 ENCOUNTER — Encounter: Payer: Self-pay | Admitting: General Practice

## 2020-07-15 ENCOUNTER — Other Ambulatory Visit: Payer: Self-pay

## 2020-07-15 VITALS — BP 140/90 | HR 76 | Ht 60.25 in | Wt 124.0 lb

## 2020-07-15 DIAGNOSIS — I10 Essential (primary) hypertension: Secondary | ICD-10-CM

## 2020-07-15 DIAGNOSIS — I2510 Atherosclerotic heart disease of native coronary artery without angina pectoris: Secondary | ICD-10-CM

## 2020-07-15 DIAGNOSIS — R42 Dizziness and giddiness: Secondary | ICD-10-CM | POA: Diagnosis not present

## 2020-07-15 DIAGNOSIS — E785 Hyperlipidemia, unspecified: Secondary | ICD-10-CM | POA: Diagnosis not present

## 2020-07-15 DIAGNOSIS — I493 Ventricular premature depolarization: Secondary | ICD-10-CM | POA: Diagnosis not present

## 2020-07-15 DIAGNOSIS — Z951 Presence of aortocoronary bypass graft: Secondary | ICD-10-CM

## 2020-07-15 MED ORDER — DILTIAZEM HCL ER COATED BEADS 120 MG PO CP24: 120.0000 mg | ORAL_CAPSULE | Freq: Every day | ORAL | 3 refills | Status: DC

## 2020-07-15 NOTE — Patient Instructions (Signed)
Medication Instructions:  START DILTIAZEM 120MG  DAILY  HOLD ROSUVASTATIN  *If you need a refill on your cardiac medications before your next appointment, please call your pharmacy*  Lab Work:   Testing/Procedures:  NONE    NONE  Special Instructions CONTINUE TO TAKE AND LOG YOUR BLOOD PRESSURE   PLEASE READ AND FOLLOW SALTY 6-ATTACHED-1,800mg  daily  PLEASE INCREASE PHYSICAL ACTIVITY AS TOLERATED  Follow-Up: Your next appointment:  1 month(s) In Person with Skeet Latch, MD OR IF UNAVAILABLE CALLIE GOODRICH PA-C   At [facility], you and your health needs are our priority.  As part of our continuing mission to provide you with exceptional heart care, we have created designated Provider Care Teams.  These Care Teams include your primary Cardiologist (physician) and Advanced Practice Providers (APPs -  Physician Assistants and Nurse Practitioners) who all work together to provide you with the care you need, when you need it.            6 SALTY THINGS TO AVOID     1,800MG  DAILY

## 2020-07-16 DIAGNOSIS — Z951 Presence of aortocoronary bypass graft: Secondary | ICD-10-CM | POA: Diagnosis not present

## 2020-07-16 DIAGNOSIS — Z48812 Encounter for surgical aftercare following surgery on the circulatory system: Secondary | ICD-10-CM | POA: Diagnosis not present

## 2020-07-16 NOTE — Telephone Encounter (Signed)
Spoke with Sharyn Lull. Patient was actually seen by Coletta Memos, NP, yesterday and was started on Diltiazem. She was also notified at that time that monitor results will be posted after final read has been completed. Therefore, no need to reach back out to the patient at this time since everything was addressed at visit yesterday.  Darreld Mclean, PA-C 07/16/2020 10:30 AM

## 2020-07-16 NOTE — Telephone Encounter (Signed)
Virginia Delgado, can you just let her know results should pop up on MyChart once final read has been done? Also, I am not back in the office until next Tuesday. Can we have the BP/HR rate log that she dropped off scanned in?  Thank you!

## 2020-07-17 ENCOUNTER — Observation Stay (HOSPITAL_COMMUNITY)
Admission: EM | Admit: 2020-07-17 | Discharge: 2020-07-18 | Disposition: A | Discharge status: Home / Self Care | Payer: Medicare HMO | Attending: Internal Medicine | Admitting: Internal Medicine

## 2020-07-17 ENCOUNTER — Emergency Department (HOSPITAL_COMMUNITY): Payer: Medicare HMO

## 2020-07-17 ENCOUNTER — Encounter (HOSPITAL_COMMUNITY): Payer: Self-pay | Admitting: *Deleted

## 2020-07-17 ENCOUNTER — Telehealth: Payer: Self-pay | Admitting: General Practice

## 2020-07-17 ENCOUNTER — Other Ambulatory Visit: Payer: Self-pay

## 2020-07-17 DIAGNOSIS — Z789 Other specified health status: Secondary | ICD-10-CM

## 2020-07-17 DIAGNOSIS — Z79899 Other long term (current) drug therapy: Secondary | ICD-10-CM | POA: Insufficient documentation

## 2020-07-17 DIAGNOSIS — I10 Essential (primary) hypertension: Secondary | ICD-10-CM | POA: Diagnosis not present

## 2020-07-17 DIAGNOSIS — N182 Chronic kidney disease, stage 2 (mild): Secondary | ICD-10-CM | POA: Diagnosis not present

## 2020-07-17 DIAGNOSIS — Z7982 Long term (current) use of aspirin: Secondary | ICD-10-CM | POA: Diagnosis not present

## 2020-07-17 DIAGNOSIS — R42 Dizziness and giddiness: Secondary | ICD-10-CM | POA: Diagnosis not present

## 2020-07-17 DIAGNOSIS — R079 Chest pain, unspecified: Secondary | ICD-10-CM

## 2020-07-17 DIAGNOSIS — R29898 Other symptoms and signs involving the musculoskeletal system: Secondary | ICD-10-CM | POA: Diagnosis not present

## 2020-07-17 DIAGNOSIS — I129 Hypertensive chronic kidney disease with stage 1 through stage 4 chronic kidney disease, or unspecified chronic kidney disease: Secondary | ICD-10-CM | POA: Insufficient documentation

## 2020-07-17 DIAGNOSIS — I160 Hypertensive urgency: Secondary | ICD-10-CM | POA: Diagnosis not present

## 2020-07-17 DIAGNOSIS — I2583 Coronary atherosclerosis due to lipid rich plaque: Secondary | ICD-10-CM | POA: Diagnosis not present

## 2020-07-17 DIAGNOSIS — N189 Chronic kidney disease, unspecified: Secondary | ICD-10-CM

## 2020-07-17 DIAGNOSIS — Z951 Presence of aortocoronary bypass graft: Secondary | ICD-10-CM | POA: Insufficient documentation

## 2020-07-17 DIAGNOSIS — R531 Weakness: Secondary | ICD-10-CM | POA: Diagnosis not present

## 2020-07-17 DIAGNOSIS — Z20822 Contact with and (suspected) exposure to covid-19: Secondary | ICD-10-CM | POA: Diagnosis not present

## 2020-07-17 DIAGNOSIS — R2689 Other abnormalities of gait and mobility: Secondary | ICD-10-CM | POA: Diagnosis not present

## 2020-07-17 DIAGNOSIS — I2510 Atherosclerotic heart disease of native coronary artery without angina pectoris: Secondary | ICD-10-CM | POA: Diagnosis present

## 2020-07-17 DIAGNOSIS — I6623 Occlusion and stenosis of bilateral posterior cerebral arteries: Secondary | ICD-10-CM | POA: Diagnosis not present

## 2020-07-17 DIAGNOSIS — I6782 Cerebral ischemia: Secondary | ICD-10-CM | POA: Diagnosis not present

## 2020-07-17 DIAGNOSIS — R270 Ataxia, unspecified: Secondary | ICD-10-CM | POA: Diagnosis not present

## 2020-07-17 DIAGNOSIS — N179 Acute kidney failure, unspecified: Secondary | ICD-10-CM

## 2020-07-17 DIAGNOSIS — J90 Pleural effusion, not elsewhere classified: Secondary | ICD-10-CM | POA: Diagnosis not present

## 2020-07-17 LAB — RESP PANEL BY RT-PCR (FLU A&B, COVID) ARPGX2
Influenza A by PCR: NEGATIVE
Influenza B by PCR: NEGATIVE
SARS Coronavirus 2 by RT PCR: NEGATIVE

## 2020-07-17 LAB — CBC
HCT: 39.8 % (ref 36.0–46.0)
Hemoglobin: 12.9 g/dL (ref 12.0–15.0)
MCH: 29.2 pg (ref 26.0–34.0)
MCHC: 32.4 g/dL (ref 30.0–36.0)
MCV: 90 fL (ref 80.0–100.0)
Platelets: 214 10*3/uL (ref 150–400)
RBC: 4.42 MIL/uL (ref 3.87–5.11)
RDW: 13.1 % (ref 11.5–15.5)
WBC: 6.6 10*3/uL (ref 4.0–10.5)
nRBC: 0 % (ref 0.0–0.2)

## 2020-07-17 LAB — COMPREHENSIVE METABOLIC PANEL
ALT: 14 U/L (ref 0–44)
AST: 21 U/L (ref 15–41)
Albumin: 4 g/dL (ref 3.5–5.0)
Alkaline Phosphatase: 70 U/L (ref 38–126)
Anion gap: 9 (ref 5–15)
BUN: 12 mg/dL (ref 8–23)
CO2: 26 mmol/L (ref 22–32)
Calcium: 10 mg/dL (ref 8.9–10.3)
Chloride: 104 mmol/L (ref 98–111)
Creatinine, Ser: 1.11 mg/dL — ABNORMAL HIGH (ref 0.44–1.00)
GFR, Estimated: 53 mL/min — ABNORMAL LOW (ref >60)
Glucose, Bld: 93 mg/dL (ref 70–99)
Potassium: 3.9 mmol/L (ref 3.5–5.1)
Sodium: 139 mmol/L (ref 135–145)
Total Bilirubin: 1 mg/dL (ref 0.3–1.2)
Total Protein: 7.2 g/dL (ref 6.5–8.1)

## 2020-07-17 LAB — TROPONIN I (HIGH SENSITIVITY)
Troponin I (High Sensitivity): 7 ng/L (ref <18)
Troponin I (High Sensitivity): 8 ng/L (ref <18)

## 2020-07-17 MED ORDER — ENOXAPARIN SODIUM 40 MG/0.4ML IJ SOSY
40.0000 mg | PREFILLED_SYRINGE | INTRAMUSCULAR | Status: DC
  Filled 2020-07-17: qty 0.4

## 2020-07-17 MED ORDER — METOPROLOL TARTRATE 25 MG PO TABS
25.0000 mg | ORAL_TABLET | Freq: Two times a day (BID) | ORAL | Status: DC
  Administered 2020-07-18 (×2): 25 mg via ORAL
  Filled 2020-07-17 (×2): qty 1

## 2020-07-17 MED ORDER — ASPIRIN EC 81 MG PO TBEC
81.0000 mg | DELAYED_RELEASE_TABLET | Freq: Every day | ORAL | Status: DC
  Administered 2020-07-18: 81 mg via ORAL
  Filled 2020-07-17: qty 1

## 2020-07-17 MED ORDER — HYDRALAZINE HCL 20 MG/ML IJ SOLN: 5.0000 mg | Freq: Four times a day (QID) | INTRAMUSCULAR | Status: DC | PRN

## 2020-07-17 MED ORDER — DILTIAZEM HCL ER COATED BEADS 120 MG PO CP24
120.0000 mg | ORAL_CAPSULE | Freq: Every day | ORAL | Status: DC
  Administered 2020-07-18: 120 mg via ORAL
  Filled 2020-07-17: qty 1

## 2020-07-17 NOTE — H&P (Signed)
History and Physical    Virginia Delgado CXK:[id number] DOB: Feb 29, 1946 DOA: 07/17/2020  PCP: Glendale Chard, MD  Patient coming from: Home, daughter at bedside  I have personally briefly reviewed patient's old medical records in [place]  Chief Complaint: dizziness/unsteady gait  HPI: Virginia Delgado is a 75 y.o. female with medical history significant for CAD s/p CABG x2 on 03/2020, hypertension, history of treated hepatitis C, CKD stage II and hyperlipidemia who presents with concerns of unsteadiness/dizziness.  Patient notes that she has been having 6 weeks of palpitation symptoms in which she feels fluttering beneath her left breast, lower abdomen and some right upper quadrant cramping.  States that they can come on at any time without any alleviating or aggravating factors.  Right upper quadrant abdominal cramping not associated with food.She denies any nausea, vomiting or diarrhea.    She was being followed by cardiology and recently had heart monitoring. Noted by cardiology to have 13 beats of VT, 57 SVT runs, isolated SVC's, ventricular bigeminal and trigeminy.  About 2 days ago she was started on diltiazem and per documentation she was supposed to take this in addition to her metoprolol.  States she was told to discontinue her metoprolol instead and replace it with diltiazem.  On the day that she started her diltiazem she already noticed some lightheadedness that gradually worsened to the point where today she felt like her gait was unsteady and she felt weak all over.  Denies any headache but feels some posterior neck tension.  Also has been having double vision for some time but  she recently had cataract surgery earlier this year and her follow-up has been postponed due to recent CABG in February.   ED Course: She presented with blood pressure of 200/120.  This slowly improved on its own down to about systolic of 970 without intervention since initially she was allowed to have permissive  hypertension while MRI imaging was pending. MRI brain resulted without any acute abnormalities. Had moderate stenosis at both PCA P1-P2 junction.   Review of Systems:  Constitutional: No Weight Change, No Fever ENT/Mouth: No sore throat, No Rhinorrhea Eyes: No Eye Pain, + Vision Changes Cardiovascular: No Chest Pain, no SOB,+ Palpitations Respiratory: No Cough, No Sputum, No Wheezing, no Dyspnea  Gastrointestinal: No Nausea, No Vomiting, No Diarrhea, No Constipation, No Pain Genitourinary: no Urinary Incontinence, No Urgency, No Flank Pain Musculoskeletal: No Arthralgias, No Myalgias Skin: No Skin Lesions, No Pruritus, Neuro: + Weakness, No Numbness,  No Loss of Consciousness, No Syncope Psych: No Anxiety/Panic, No Depression, no decrease appetite Heme/Lymph: No Bruising, No Bleeding  Past Medical History:  Diagnosis Date  . Coronary artery calcification 04/05/2019   Calcium score 93rd percentile 08/2018.  Marland Kitchen Dermatitis   . Hx of hepatitis C   . Hypertension     Past Surgical History:  Procedure Laterality Date  . BUNIONECTOMY Bilateral   . CESAREAN SECTION  1976  . CESAREAN SECTION  1979  . CLIPPING OF ATRIAL APPENDAGE Left 04/22/2020   Procedure: CLIPPING OF  LEFT ATRIAL APPENDAGE USING ATRICURE ATRICLIPFLEX-V 35;  Surgeon: Wonda Olds, MD;  Location: [place];  Service: Open Heart Surgery;  Laterality: Left;  . CORONARY ARTERY BYPASS GRAFT N/A 04/22/2020   Procedure: CORONARY ARTERY BYPASS GRAFTING (CABGX4). USING BILATERAL MAMMARIES;  Surgeon: Wonda Olds, MD;  Location: [place];  Service: Open Heart Surgery;  Laterality: N/A;  BIMA  . LEFT HEART CATH AND CORONARY ANGIOGRAPHY N/A 04/20/2020   Procedure: LEFT  HEART CATH AND CORONARY ANGIOGRAPHY;  Surgeon: Nelva Bush, MD;  Location: [facility];  Service: Cardiovascular;  Laterality: N/A;  . RADIAL ARTERY HARVEST Right 04/22/2020   Procedure: RIGHT RADIAL ARTERY HARVESTING.;  Surgeon: Wonda Olds, MD;   Location: [place];  Service: Open Heart Surgery;  Laterality: Right;  . TEE WITHOUT CARDIOVERSION N/A 04/22/2020   Procedure: TRANSESOPHAGEAL ECHOCARDIOGRAM (TEE);  Surgeon: Wonda Olds, MD;  Location: [place];  Service: Open Heart Surgery;  Laterality: N/A;  . [place]     reports that she has never smoked. She has never used smokeless tobacco. She reports current alcohol use. She reports that she does not use drugs. Social History  Allergies  Allergen Reactions  . Contrast Media [Iodinated Diagnostic Agents] Swelling    Of face, especially eyes  . Gadobutrol Other (See Comments)    Felt extremities go numb briefly as contrast injected.  Had cough (bronchospasm?) briefly after scan.  Resolved on own without treatment.  07/13/15  . Gadolinium Derivatives Other (See Comments)    Felt extremities go numb briefly as contrast injected.  Had cough (bronchospasm?) briefly after scan.  Resolved on own without treatment.  07/13/15  . Lidocaine Other (See Comments)    Facial swelling after dental procedure  . Pravachol [Pravastatin Sodium]   . Latex Rash and Other (See Comments)    and irritation    Family History  Problem Relation Age of Onset  . Cancer Mother        breast  . COPD Mother   . Heart disease Father   . Cancer Sister        sarcoma  . Hypertension Brother   . Ovarian cancer Maternal Grandmother   . Rheum arthritis Paternal Grandmother   . Hypertension Sister      Prior to Admission medications   Medication Sig Start Date End Date Taking? Authorizing Provider  acetaminophen (TYLENOL) 500 MG tablet Take 2 tablets (1,000 mg total) by mouth every 6 (six) hours as needed. Patient taking differently: Take 1,000 mg by mouth every 6 (six) hours as needed for mild pain. 04/27/20  Yes Barrett, Erin R, PA-C  aspirin 81 MG tablet Take 81 mg by mouth at bedtime.   Yes [provider]  Cholecalciferol (VITAMIN D) 50 MCG (2000 UT) tablet Take 4,000 Units  by mouth daily.   Yes [provider]  diltiazem (CARDIZEM CD) 120 MG 24 hr capsule Take 1 capsule (120 mg total) by mouth daily. 07/15/20 10/13/20 Yes Cleaver, Jossie Ng, NP  Multiple Minerals-Vitamins (GNP CAL MAG ZINC +D3 PO) Take 1 tablet by mouth in the morning and at bedtime.   Yes [provider]  NONI, MORINDA CITRIFOLIA, PO Take 600 mg by mouth 2 (two) times daily.   Yes [provider]  OVER THE COUNTER MEDICATION Take 1 capsule by mouth in the morning and at bedtime. [place]   Yes [provider]  rosuvastatin (CRESTOR) 5 MG tablet Take 1 tablet (5 mg total) by mouth daily. 06/16/20  Yes Sande Rives E, PA-C  metoprolol tartrate (LOPRESSOR) 25 MG tablet Take 1 tablet (25 mg total) by mouth 2 (two) times daily. 04/27/20   Freddrick March, PA-C    Physical Exam: Vitals:   07/17/20 2130 07/17/20 2132 07/17/20 2200 07/17/20 2230  BP: (!) 179/117 (!) 170/92 (!) 150/104 (!) 175/98  Pulse: 83 83 (!) 50 78  Resp: 17 17 13  (!) 24  Temp:  TempSrc:      SpO2: 99% 97% 97% 98%  Weight:      Height:        Constitutional: NAD, calm, comfortable, elderly female appearing younger than her stated age sitting upright in bed Vitals:   07/17/20 2130 07/17/20 2132 07/17/20 2200 07/17/20 2230  BP: (!) 179/117 (!) 170/92 (!) 150/104 (!) 175/98  Pulse: 83 83 (!) 50 78  Resp: 17 17 13  (!) 24  Temp:      TempSrc:      SpO2: 99% 97% 97% 98%  Weight:      Height:       Eyes: PERRL, lids and conjunctivae normal ENMT: Mucous membranes are moist.  Neck: normal, supple Respiratory: clear to auscultation bilaterally, no wheezing, no crackles. Normal respiratory effort. No accessory muscle use.  Cardiovascular: Regular rate and rhythm, no murmurs / rubs / gallops. No extremity edema. Abdomen: no tenderness, no masses palpated.  Bowel sounds positive.  Musculoskeletal: no clubbing / cyanosis. No joint deformity upper and lower extremities. Good ROM, no  contractures. Normal muscle tone.  Skin: no rashes, lesions, ulcers. No induration Neurologic: CN 2-12 grossly intact. Sensation intact,  Strength 5/5 in all 4.  Psychiatric: Normal judgment and insight. Alert and oriented x 3. Normal mood.     Labs on Admission: I have personally reviewed following labs and imaging studies  CBC: Recent Labs  Lab 07/17/20 1531  WBC 6.6  HGB 12.9  HCT 39.8  MCV 90.0  PLT [id number]   Basic Metabolic Panel: Recent Labs  Lab 07/17/20 1531  NA 139  K 3.9  CL 104  CO2 26  GLUCOSE 93  BUN 12  CREATININE 1.11*  CALCIUM 10.0   GFR: Estimated Creatinine Clearance: 36.2 mL/min (A) (by C-G formula based on SCr of 1.11 mg/dL (H)). Liver Function Tests: Recent Labs  Lab 07/17/20 1531  AST 21  ALT 14  ALKPHOS 70  BILITOT 1.0  PROT 7.2  ALBUMIN 4.0   No results for input(s): LIPASE, AMYLASE in the last 168 hours. No results for input(s): AMMONIA in the last 168 hours. Coagulation Profile: No results for input(s): INR, PROTIME in the last 168 hours. Cardiac Enzymes: No results for input(s): CKTOTAL, CKMB, CKMBINDEX, TROPONINI in the last 168 hours. BNP (last 3 results) No results for input(s): PROBNP in the last 8760 hours. HbA1C: No results for input(s): HGBA1C in the last 72 hours. CBG: No results for input(s): GLUCAP in the last 168 hours. Lipid Profile: No results for input(s): CHOL, HDL, LDLCALC, TRIG, CHOLHDL, LDLDIRECT in the last 72 hours. Thyroid Function Tests: No results for input(s): TSH, T4TOTAL, FREET4, T3FREE, THYROIDAB in the last 72 hours. Anemia Panel: No results for input(s): VITAMINB12, FOLATE, FERRITIN, TIBC, IRON, RETICCTPCT in the last 72 hours. Urine analysis:    Component Value Date/Time   COLORURINE YELLOW 04/21/2020 1425   APPEARANCEUR HAZY (A) 04/21/2020 1425   LABSPEC 1.013 04/21/2020 1425   PHURINE 7.0 04/21/2020 1425   GLUCOSEU NEGATIVE 04/21/2020 1425   HGBUR NEGATIVE 04/21/2020 1425   BILIRUBINUR  negative 06/26/2020 0936   KETONESUR NEGATIVE 04/21/2020 1425   PROTEINUR Positive (A) 06/26/2020 0936   PROTEINUR NEGATIVE 04/21/2020 1425   UROBILINOGEN 0.2 06/26/2020 0936   NITRITE negative 06/26/2020 0936   NITRITE NEGATIVE 04/21/2020 1425   LEUKOCYTESUR Negative 06/26/2020 0936   LEUKOCYTESUR NEGATIVE 04/21/2020 1425    Radiological Exams on Admission: DG Chest 2 View  Result Date: 07/17/2020 CLINICAL DATA:  Dizziness. EXAM: CHEST -  2 VIEW COMPARISON:  May 08, 2020. FINDINGS: Stable cardiomediastinal silhouette. No pneumothorax is noted. Sternotomy wires are noted. Small bilateral pleural effusions are noted. Lungs are clear. Bony thorax is unremarkable. IMPRESSION: Small bilateral pleural effusions. No other significant abnormality seen. Electronically Signed   By: Marijo Conception M.D.   On: 07/17/2020 16:13   CT Head Wo Contrast  Result Date: 07/17/2020 CLINICAL DATA:  Balance difficulties for 2 days EXAM: CT HEAD WITHOUT CONTRAST TECHNIQUE: Contiguous axial images were obtained from the base of the skull through the vertex without intravenous contrast. COMPARISON:  06/24/2015 FINDINGS: Brain: No evidence of acute infarction, hemorrhage, hydrocephalus, extra-axial collection or mass lesion/mass effect. Patchy chronic white matter ischemic changes noted. Vascular: No hyperdense vessel or unexpected calcification. Skull: Normal. Negative for fracture or focal lesion. Sinuses/Orbits: No acute finding. Other: None. IMPRESSION: Chronic white matter ischemic change. No acute abnormality noted. Electronically Signed   By: Inez Catalina M.D.   On: 07/17/2020 16:29   MR ANGIO HEAD WO CONTRAST  Result Date: 07/17/2020 CLINICAL DATA:  Ataxia and right-sided weakness EXAM: MRI HEAD WITHOUT CONTRAST MRA HEAD WITHOUT CONTRAST TECHNIQUE: Multiplanar, multi-echo pulse sequences of the brain and surrounding structures were acquired without intravenous contrast. Angiographic images of the [place] were acquired using MRA technique without intravenous contrast. COMPARISON: No pertinent prior exam. COMPARISON:  07/13/2015 FINDINGS: MRI HEAD FINDINGS Brain: No acute infarct, mass effect or extra-axial collection. No acute or chronic hemorrhage. There is multifocal hyperintense T2-weighted signal within the white matter. Parenchymal volume and CSF spaces are normal. The midline structures are normal. Vascular: Major flow voids are preserved. Skull and upper cervical spine: Normal calvarium and skull base. Visualized upper cervical spine and soft tissues are normal. Sinuses/Orbits:No paranasal sinus fluid levels or advanced mucosal thickening. No mastoid or middle ear effusion. Normal orbits. MRA HEAD FINDINGS POSTERIOR CIRCULATION: --Vertebral arteries: Normal right V4 segment. Left V4 segment is again not visualized. --Inferior cerebellar arteries: Normal. --Basilar artery: Normal. --Superior cerebellar arteries: Normal. --Posterior cerebral arteries: Moderate stenosis at both P1 P2 junctions. ANTERIOR CIRCULATION: --Intracranial internal carotid arteries: Normal. --Anterior cerebral arteries (ACA): Normal. --Middle cerebral arteries (MCA): Normal. ANATOMIC VARIANTS: Fetal origin of the right PCA. IMPRESSION: 1. No acute intracranial abnormality. 2. Chronic small vessel ischemic changes. 3. No emergent large vessel occlusion. 4. Moderate stenosis at both PCA P1-P2 junctions. Electronically Signed   By: Ulyses Jarred M.D.   On: 07/17/2020 20:00   MR BRAIN WO CONTRAST  Result Date: 07/17/2020 CLINICAL DATA:  Ataxia and right-sided weakness EXAM: MRI HEAD WITHOUT CONTRAST MRA HEAD WITHOUT CONTRAST TECHNIQUE: Multiplanar, multi-echo pulse sequences of the brain and surrounding structures were acquired without intravenous contrast. Angiographic images of the [place] were acquired using MRA technique without intravenous contrast. COMPARISON: No pertinent prior exam. COMPARISON:  07/13/2015  FINDINGS: MRI HEAD FINDINGS Brain: No acute infarct, mass effect or extra-axial collection. No acute or chronic hemorrhage. There is multifocal hyperintense T2-weighted signal within the white matter. Parenchymal volume and CSF spaces are normal. The midline structures are normal. Vascular: Major flow voids are preserved. Skull and upper cervical spine: Normal calvarium and skull base. Visualized upper cervical spine and soft tissues are normal. Sinuses/Orbits:No paranasal sinus fluid levels or advanced mucosal thickening. No mastoid or middle ear effusion. Normal orbits. MRA HEAD FINDINGS POSTERIOR CIRCULATION: --Vertebral arteries: Normal right V4 segment. Left V4 segment is again not visualized. --Inferior cerebellar arteries: Normal. --Basilar artery: Normal. --Superior cerebellar arteries: Normal. --Posterior cerebral  arteries: Moderate stenosis at both P1 P2 junctions. ANTERIOR CIRCULATION: --Intracranial internal carotid arteries: Normal. --Anterior cerebral arteries (ACA): Normal. --Middle cerebral arteries (MCA): Normal. ANATOMIC VARIANTS: Fetal origin of the right PCA. IMPRESSION: 1. No acute intracranial abnormality. 2. Chronic small vessel ischemic changes. 3. No emergent large vessel occlusion. 4. Moderate stenosis at both PCA P1-P2 junctions. Electronically Signed   By: Ulyses Jarred M.D.   On: 07/17/2020 20:00      Assessment/Plan   Dizziness secondary to Hypertensive urgency  -MRI brain and MRA head negative -pt replaced metoprolol with diltiazem rather than adding it is in addition as intended by cardiology -Will resume metoprolol along with diltiazem -PRN IV hydralazine for systolic greater than 329/924 -Keep on continuous telemetry  Hx of PVC -continue metoprolol and diltiazem  CAD s/p CABG -Asymptomatic  AKI on CKD stage 2 - Cr of 1.11 from prior 0.87 - follow with repeat lab after 500cc NS bolus  HLD  -Statin currently on hold by cardiology due to possible  myalgia   DVT prophylaxis:.Lovenox Code Status: Full Family Communication: Plan discussed with patient at bedside  disposition Plan: Home with observation Consults called:  Admission status: Observation     Level of care: Telemetry Cardiac  Status is: Observation  The patient remains OBS appropriate and will d/c before 2 midnights.  Dispo: The patient is from: Home              Anticipated d/c is to: Home              Patient currently is not medically stable to d/c.   Difficult to place patient No         Orene Desanctis DO [facility]   If 7PM-7AM, please contact night-coverage [email or web address]   07/17/2020, 11:37 PM

## 2020-07-17 NOTE — Telephone Encounter (Signed)
Per Coletta Memos, FNP-C: send her to the ED, She needs to be evaluated in the ED. there are too many variables that need to be assessed. Returned call to pt and notified her of Coletta Memos, FNP-C message and told her to go to [facility] ER. Informed pt to make sure she does not driver herself to the ER. Pt verbalized understanding. She will have someone take her to to the ER. Cardmaster notified via VM.

## 2020-07-17 NOTE — ED Triage Notes (Signed)
PT states she began feeling off balance and falling yesterday.  Recently had quadruple bipass and the cardiologist changed several medications this week.  Denies headache, but states neck pain.

## 2020-07-17 NOTE — Telephone Encounter (Signed)
She will need to be seen and evaluated in the ED. He Bp should not have gone up after taking the diltiazem medication.  She also needs to be evaluated for her unstable gait and received treatment for her elevated blood pressure.  Thank you.   Denyse Amass

## 2020-07-17 NOTE — ED Provider Notes (Signed)
Emergency Medicine Provider Triage Evaluation Note  Virginia Delgado , a 75 y.o. female  was evaluated in triage. Pt complains of ataxia- reports feeling off balance since last night, falls towards right side without specific right side weakness. CP onset upon arrival in the ER, located under left breast, dull in nature, history of quadruple bypass surgery in February 2020.  Hypertensive on arrival with reported compliance with her blood pressure medications.   Review of Systems  Positive: CP, gait disturbance Negative: Numbness, changes in vision/speech   Physical Exam  BP (!) 205/122 (BP Location: Right Arm)   Pulse (!) 39   Temp 99 F (37.2 C) (Oral)   Resp 16   SpO2 100%  Gen:   Awake, no distress   Resp:  Normal effort  MSK:   Moves extremities without difficulty  Other:  No drift, no unilateral arm or leg weakness  Medical Decision Making  Medically screening exam initiated at 3:29 PM.  Appropriate orders placed.  Geralynn Brassell was informed that the remainder of the evaluation will be completed by another provider, this initial triage assessment does not replace that evaluation, and the importance of remaining in the ED until their evaluation is complete.     Tacy Learn, PA-C 07/17/20 1530    Elnora Morrison, MD 07/17/20 [id number]

## 2020-07-17 NOTE — ED Notes (Signed)
Patient transported to CT 

## 2020-07-17 NOTE — Telephone Encounter (Signed)
Returned call to pt she states that started taking her diltiazem yesterday and states that she is extremly "off balance".  She cannot walk without assistance.  She is bumping into walls to keep from falling. Her BP today @630am  180/100. She took it again "a few minutes ago" 163/96 HR 95. What do we do next? Please advise

## 2020-07-17 NOTE — ED Provider Notes (Signed)
[facility]ERGENC[facility] DEPARTMENT Provider Note   CSN: [id number] Arrival date & time: 07/17/20  1503     History Chief Complaint  Patient presents with  . Dizziness  . Hypertension    Virginia Delgado is a 75 y.o. female.  HPI Patient presents with dizziness unsteadiness.  Last normal was around Wednesday with today being Friday.  Had been seeing cardiology and on Wednesday had adjustment of her medications.  States she was a little unsteady at that time.  Had metoprolol stopped and Cardizem started.  History of CABG.  States she is feeling more unsteady today patient states that he got bad now she is having difficulty walking.  States she was continuously falling or felt as if she would fall to the right.  No headache but states she has a little pain in the sides of her neck.  No chest pain.  No trouble breathing.  No fevers or chills.  Is not had episodes like this before.  Patient states she is feeling better now than she was earlier but still does not feel back to normal.    Past Medical History:  Diagnosis Date  . Coronary artery calcification 04/05/2019   Calcium score 93rd percentile 08/2018.  Marland Kitchen Dermatitis   . Hx of hepatitis C   . Hypertension     Patient Active Problem List   Diagnosis Date Noted  . S/P CABG x 4 04/26/2020  . Polyarthritis with positive rheumatoid factor ([place]) 02/10/2020  . Statin intolerance 11/26/2019  . CAD (coronary artery disease) 04/05/2019  . Hypertensive nephropathy 03/12/2018  . Chronic renal disease, stage II 03/12/2018  . Estrogen deficiency 03/12/2018  . Hyperlipidemia LDL goal <70 10/20/2017  . Menopause 07/19/2011  . Hypertension   . Hepatitis C     Past Surgical History:  Procedure Laterality Date  . BUNIONECTOMY Bilateral   . CESAREAN SECTION  1976  . CESAREAN SECTION  1979  . CLIPPING OF ATRIAL APPENDAGE Left 04/22/2020   Procedure: CLIPPING OF  LEFT ATRIAL APPENDAGE USING ATRICURE ATRICLIPFLEX-V 35;  Surgeon:  Wonda Olds, MD;  Location: [facility];  Service: Open Heart Surgery;  Laterality: Left;  . CORONARY ARTERY BYPASS GRAFT N/A 04/22/2020   Procedure: CORONARY ARTERY BYPASS GRAFTING (CABGX4). USING BILATERAL MAMMARIES;  Surgeon: Wonda Olds, MD;  Location: [facility];  Service: Open Heart Surgery;  Laterality: N/A;  BIMA  . LEFT HEART CATH AND CORONARY ANGIOGRAPHY N/A 04/20/2020   Procedure: LEFT HEART CATH AND CORONARY ANGIOGRAPHY;  Surgeon: Nelva Bush, MD;  Location: [facility];  Service: Cardiovascular;  Laterality: N/A;  . RADIAL ARTERY HARVEST Right 04/22/2020   Procedure: RIGHT RADIAL ARTERY HARVESTING.;  Surgeon: Wonda Olds, MD;  Location: [facility];  Service: Open Heart Surgery;  Laterality: Right;  . TEE WITHOUT CARDIOVERSION N/A 04/22/2020   Procedure: TRANSESOPHAGEAL ECHOCARDIOGRAM (TEE);  Surgeon: Wonda Olds, MD;  Location: [facility];  Service: Open Heart Surgery;  Laterality: N/A;  . UMBILICAL HERNIA REPAIR  1981     OB History    Gravida  4   Para  2   Term  2   Preterm      AB  2   Living        SAB      IAB  2   Ectopic      Multiple      Live Births              Family History  Problem Relation Age of Onset  . Cancer Mother        breast  . COPD Mother   . Heart disease Father   . Cancer Sister        sarcoma  . Hypertension Brother   . Ovarian cancer Maternal Grandmother   . Rheum arthritis Paternal Grandmother   . Hypertension Sister     Social History   Tobacco Use  . Smoking status: Never Smoker  . Smokeless tobacco: Never Used  Vaping Use  . Vaping Use: Never used  Substance Use Topics  . Alcohol use: Yes    Comment: social  . Drug use: No    Home Medications Prior to Admission medications   Medication Sig Start Date End Date Taking? Authorizing Provider  acetaminophen (TYLENOL) 500 MG tablet Take 2 tablets (1,000 mg total) by mouth every 6 (six) hours as needed. Patient taking differently: Take 1,000 mg by  mouth every 6 (six) hours as needed for mild pain. 04/27/20   Barrett, Lodema Hong, PA-C  aspirin 81 MG tablet Take 81 mg by mouth at bedtime.    [provider]  Cholecalciferol (VITAMIN D) 50 MCG (2000 UT) tablet Take 4,000 Units by mouth daily.    [provider]  diltiazem (CARDIZEM CD) 120 MG 24 hr capsule Take 1 capsule (120 mg total) by mouth daily. 07/15/20 10/13/20  Deberah Pelton, NP  metoprolol tartrate (LOPRESSOR) 25 MG tablet Take 1 tablet (25 mg total) by mouth 2 (two) times daily. 04/27/20   Barrett, Erin R, PA-C  Multiple Minerals-Vitamins (GNP CAL MAG ZINC +D3 PO) Take 1 tablet by mouth in the morning and at bedtime.    [provider]  NONI, MORINDA CITRIFOLIA, PO Take 600 mg by mouth 2 (two) times a week.    [provider]  OVER THE COUNTER MEDICATION Take 1 capsule by mouth in the morning and at bedtime. [place]    [provider]  rosuvastatin (CRESTOR) 5 MG tablet Take 1 tablet (5 mg total) by mouth daily. 06/16/20   Sande Rives E, PA-C    Allergies    Contrast media [iodinated diagnostic agents], Gadobutrol, Gadolinium derivatives, Lidocaine, Pravachol [pravastatin sodium], and Latex  Review of Systems   Review of Systems  Constitutional: Negative for appetite change.  HENT: Negative for congestion.   Respiratory: Negative for shortness of breath.   Cardiovascular: Negative for chest pain.  Gastrointestinal: Negative for abdominal pain.  Genitourinary: Negative for flank pain.  Musculoskeletal: Positive for neck pain.  Neurological: Positive for dizziness. Negative for headaches.  Psychiatric/Behavioral: Negative for confusion.    Physical Exam Updated Vital Signs BP (!) 176/108   Pulse 84   Temp 99 F (37.2 C) (Oral)   Resp 20   Ht 5' 0.25" (1.53 m)   Wt 56.2 kg   SpO2 96%   BMI 24.02 kg/m   Physical Exam Vitals and nursing note reviewed.  HENT:     Head: Atraumatic.     Right Ear: External ear normal.      Left Ear: External ear normal.  Eyes:     Extraocular Movements: Extraocular movements intact.     Pupils: Pupils are equal, round, and reactive to light.     Comments: No nystagmus.  Cardiovascular:     Rate and Rhythm: Regular rhythm.  Pulmonary:     Effort: Pulmonary effort is normal.  Musculoskeletal:        General: No tenderness.  Cervical back: Neck supple.  Skin:    General: Skin is warm.  Neurological:     Mental Status: She is alert and oriented to person, place, and time.     Comments: No Romberg.  Finger-nose intact.  Eye movements intact.  Visual fields grossly intact.  However unsteady with walking.  Psychiatric:        Mood and Affect: Mood normal.     ED Results / Procedures / Treatments   Labs (all labs ordered are listed, but only abnormal results are displayed) Labs Reviewed  COMPREHENSIVE METABOLIC PANEL - Abnormal; Notable for the following components:      Result Value   Creatinine, Ser 1.11 (*)    GFR, Estimated 53 (*)    All other components within normal limits  RESP PANEL BY RT-PCR (FLU A&B, COVID) ARPGX2  CBC  TROPONIN I (HIGH SENSITIVITY)  TROPONIN I (HIGH SENSITIVITY)    EKG EKG Interpretation  Date/Time:  Friday Jul 17 2020 15:30:31 EDT Ventricular Rate:  73 PR Interval:  142 QRS Duration: 72 QT Interval:  402 QTC Calculation: 442 R Axis:   17 Text Interpretation: Sinus rhythm with Premature supraventricular complexes and with occasional Premature ventricular complexes Right atrial enlargement Borderline ECG Confirmed by Davonna Belling [phone]) on 07/17/2020 6:36:22 PM   Radiology DG Chest 2 View  Result Date: 07/17/2020 CLINICAL DATA:  Dizziness. EXAM: CHEST - 2 VIEW COMPARISON:  May 08, 2020. FINDINGS: Stable cardiomediastinal silhouette. No pneumothorax is noted. Sternotomy wires are noted. Small bilateral pleural effusions are noted. Lungs are clear. Bony thorax is unremarkable. IMPRESSION: Small bilateral pleural  effusions. No other significant abnormality seen. Electronically Signed   By: Marijo Conception M.D.   On: 07/17/2020 16:13   CT Head Wo Contrast  Result Date: 07/17/2020 CLINICAL DATA:  Balance difficulties for 2 days EXAM: CT HEAD WITHOUT CONTRAST TECHNIQUE: Contiguous axial images were obtained from the base of the skull through the vertex without intravenous contrast. COMPARISON:  06/24/2015 FINDINGS: Brain: No evidence of acute infarction, hemorrhage, hydrocephalus, extra-axial collection or mass lesion/mass effect. Patchy chronic white matter ischemic changes noted. Vascular: No hyperdense vessel or unexpected calcification. Skull: Normal. Negative for fracture or focal lesion. Sinuses/Orbits: No acute finding. Other: None. IMPRESSION: Chronic white matter ischemic change. No acute abnormality noted. Electronically Signed   By: Inez Catalina M.D.   On: 07/17/2020 16:29   MR ANGIO HEAD WO CONTRAST  Result Date: 07/17/2020 CLINICAL DATA:  Ataxia and right-sided weakness EXAM: MRI HEAD WITHOUT CONTRAST MRA HEAD WITHOUT CONTRAST TECHNIQUE: Multiplanar, multi-echo pulse sequences of the brain and surrounding structures were acquired without intravenous contrast. Angiographic images of the Circle of Willis were acquired using MRA technique without intravenous contrast. COMPARISON: No pertinent prior exam. COMPARISON:  07/13/2015 FINDINGS: MRI HEAD FINDINGS Brain: No acute infarct, mass effect or extra-axial collection. No acute or chronic hemorrhage. There is multifocal hyperintense T2-weighted signal within the white matter. Parenchymal volume and CSF spaces are normal. The midline structures are normal. Vascular: Major flow voids are preserved. Skull and upper cervical spine: Normal calvarium and skull base. Visualized upper cervical spine and soft tissues are normal. Sinuses/Orbits:No paranasal sinus fluid levels or advanced mucosal thickening. No mastoid or middle ear effusion. Normal orbits. MRA HEAD  FINDINGS POSTERIOR CIRCULATION: --Vertebral arteries: Normal right V4 segment. Left V4 segment is again not visualized. --Inferior cerebellar arteries: Normal. --Basilar artery: Normal. --Superior cerebellar arteries: Normal. --Posterior cerebral arteries: Moderate stenosis at both P1 P2 junctions. ANTERIOR CIRCULATION: --Intracranial  internal carotid arteries: Normal. --Anterior cerebral arteries (ACA): Normal. --Middle cerebral arteries (MCA): Normal. ANATOMIC VARIANTS: Fetal origin of the right PCA. IMPRESSION: 1. No acute intracranial abnormality. 2. Chronic small vessel ischemic changes. 3. No emergent large vessel occlusion. 4. Moderate stenosis at both PCA P1-P2 junctions. Electronically Signed   By: Ulyses Jarred M.D.   On: 07/17/2020 20:00   MR BRAIN WO CONTRAST  Result Date: 07/17/2020 CLINICAL DATA:  Ataxia and right-sided weakness EXAM: MRI HEAD WITHOUT CONTRAST MRA HEAD WITHOUT CONTRAST TECHNIQUE: Multiplanar, multi-echo pulse sequences of the brain and surrounding structures were acquired without intravenous contrast. Angiographic images of the Circle of Willis were acquired using MRA technique without intravenous contrast. COMPARISON: No pertinent prior exam. COMPARISON:  07/13/2015 FINDINGS: MRI HEAD FINDINGS Brain: No acute infarct, mass effect or extra-axial collection. No acute or chronic hemorrhage. There is multifocal hyperintense T2-weighted signal within the white matter. Parenchymal volume and CSF spaces are normal. The midline structures are normal. Vascular: Major flow voids are preserved. Skull and upper cervical spine: Normal calvarium and skull base. Visualized upper cervical spine and soft tissues are normal. Sinuses/Orbits:No paranasal sinus fluid levels or advanced mucosal thickening. No mastoid or middle ear effusion. Normal orbits. MRA HEAD FINDINGS POSTERIOR CIRCULATION: --Vertebral arteries: Normal right V4 segment. Left V4 segment is again not visualized. --Inferior  cerebellar arteries: Normal. --Basilar artery: Normal. --Superior cerebellar arteries: Normal. --Posterior cerebral arteries: Moderate stenosis at both P1 P2 junctions. ANTERIOR CIRCULATION: --Intracranial internal carotid arteries: Normal. --Anterior cerebral arteries (ACA): Normal. --Middle cerebral arteries (MCA): Normal. ANATOMIC VARIANTS: Fetal origin of the right PCA. IMPRESSION: 1. No acute intracranial abnormality. 2. Chronic small vessel ischemic changes. 3. No emergent large vessel occlusion. 4. Moderate stenosis at both PCA P1-P2 junctions. Electronically Signed   By: Ulyses Jarred M.D.   On: 07/17/2020 20:00    Procedures Procedures   Medications Ordered in ED Medications - No data to display  ED Course  I have reviewed the triage vital signs and the nursing notes.  Pertinent labs & imaging results that were available during my care of the patient were reviewed by me and considered in my medical decision making (see chart for details).    MDM Rules/Calculators/A&P                          Patient presents with dizziness.  Has had for the last couple days.  Had been started on Cardizem by cardiology.  Blood pressure very elevated upon arrival with systolics of over 347 and diastolics of from 425.  Also dizzy.  Difficulty walking due to the dizziness.  Not spinning but more of an unsteadiness.  Thought question of hypertensive cause of the dizziness versus stroke.  Would not want to adjust blood pressure more quickly if had a stroke.  Discussed with neurology and we decided to get MRI.  MRI done and did not show intracranial abnormality.  With this the blood pressure is likely the cause of dizziness making it more urgent control.  However on reevaluation patient states she is feeling a lot better.  Not as dizzy.  Blood pressure has improved somewhat but still around 180/100.  With symptoms improving though I think does not need ICU placement for very tight control but I think would  benefit from admission to the hospital still.  Will discuss with hospitalist Final Clinical Impression(s) / ED Diagnoses Final diagnoses:  Hypertension, unspecified type  Dizziness    Rx /  DC Orders ED Discharge Orders    None       Davonna Belling, MD 07/17/20 2131

## 2020-07-17 NOTE — ED Notes (Signed)
Pt in imaging

## 2020-07-17 NOTE — Telephone Encounter (Signed)
New message    Pt c/o medication issue:  1. Name of Medication: diltiazem  2. How are you currently taking this medication (dosage and times per day)? 120mg  daily  3. Are you having a reaction (difficulty breathing--STAT)? no  4. What is your medication issue? Patient was seen wed the 18th and started taking medication yesterday.  Since starting the medication, patient states that is is extremly "off balance".  She cannot walk without assistance.  She is bumping into walls to keep from falling.  Patient request a callback from the NP or PA.

## 2020-07-18 ENCOUNTER — Encounter (HOSPITAL_COMMUNITY): Payer: Self-pay | Admitting: Family Medicine

## 2020-07-18 DIAGNOSIS — I160 Hypertensive urgency: Secondary | ICD-10-CM

## 2020-07-18 DIAGNOSIS — N179 Acute kidney failure, unspecified: Secondary | ICD-10-CM

## 2020-07-18 DIAGNOSIS — N189 Chronic kidney disease, unspecified: Secondary | ICD-10-CM

## 2020-07-18 LAB — CBC
HCT: 38.3 % (ref 36.0–46.0)
Hemoglobin: 12.5 g/dL (ref 12.0–15.0)
MCH: 29.2 pg (ref 26.0–34.0)
MCHC: 32.6 g/dL (ref 30.0–36.0)
MCV: 89.5 fL (ref 80.0–100.0)
Platelets: 205 10*3/uL (ref 150–400)
RBC: 4.28 MIL/uL (ref 3.87–5.11)
RDW: 13.1 % (ref 11.5–15.5)
WBC: 6.7 10*3/uL (ref 4.0–10.5)
nRBC: 0 % (ref 0.0–0.2)

## 2020-07-18 LAB — BASIC METABOLIC PANEL
Anion gap: 9 (ref 5–15)
BUN: 14 mg/dL (ref 8–23)
CO2: 23 mmol/L (ref 22–32)
Calcium: 10 mg/dL (ref 8.9–10.3)
Chloride: 105 mmol/L (ref 98–111)
Creatinine, Ser: 0.99 mg/dL (ref 0.44–1.00)
GFR, Estimated: >60 mL/min (ref >60)
Glucose, Bld: 97 mg/dL (ref 70–99)
Potassium: 3.6 mmol/L (ref 3.5–5.1)
Sodium: 137 mmol/L (ref 135–145)

## 2020-07-18 MED ORDER — ACETAMINOPHEN 325 MG PO TABS
650.0000 mg | ORAL_TABLET | Freq: Four times a day (QID) | ORAL | Status: DC | PRN
  Administered 2020-07-18: 650 mg via ORAL
  Filled 2020-07-18: qty 2

## 2020-07-18 MED ORDER — SODIUM CHLORIDE 0.9 % IV BOLUS
500.0000 mL | Freq: Once | INTRAVENOUS | Status: AC
  Administered 2020-07-18: 500 mL via INTRAVENOUS

## 2020-07-18 NOTE — Plan of Care (Signed)
  Problem: Education: Goal: Knowledge of General Education information will improve Description Including pain rating scale, medication(s)/side effects and non-pharmacologic comfort measures Outcome: Progressing   

## 2020-07-18 NOTE — Discharge Summary (Signed)
Physician Discharge Summary  Virginia Delgado IEP:[id number] DOB: 04-23-1945 DOA: 07/17/2020  PCP: Glendale Chard, MD  Admit date: 07/17/2020 Discharge date: 07/18/2020  Admitted From: Home Disposition: Home  Recommendations for Outpatient Follow-up:  1. Follow up with PCP in 1-2 weeks 2. Please obtain BMP/CBC in one week 3. Please follow up Dr. Oval Linsey cardiology 4. Continue metoprolol and Cardizem together  Home Health: None  Equipment/Devices: None Discharge Condition: Stable CODE STATUS: Full code Diet recommendation: Cardiac diet Brief/Interim Summary:Virginia Delgado is a 75 y.o. female with medical history significant for CAD s/p CABG x2 on 03/2020, hypertension, history of treated hepatitis C, CKD stage II and hyperlipidemia who presents with concerns of unsteadiness/dizziness.  Patient notes that she has been having 6 weeks of palpitation symptoms in which she feels fluttering beneath her left breast, lower abdomen and some right upper quadrant cramping.  States that they can come on at any time without any alleviating or aggravating factors.  Right upper quadrant abdominal cramping not associated with food.She denies any nausea, vomiting or diarrhea.    She was being followed by cardiology and recently had heart monitoring. Noted by cardiology to have 13 beats of VT, 57 SVT runs, isolated SVC's, ventricular bigeminal and trigeminy.  About 2 days ago she was started on diltiazem and per documentation she was supposed to take this in addition to her metoprolol.  States she was told to discontinue her metoprolol instead and replace it with diltiazem.  On the day that she started her diltiazem she already noticed some lightheadedness that gradually worsened to the point where today she felt like her gait was unsteady and she felt weak all over.  Denies any headache but feels some posterior neck tension.  Also has been having double vision for some time but  she recently had cataract surgery  earlier this year and her follow-up has been postponed due to recent CABG in February.   ED Course: She presented with blood pressure of 200/120.  This slowly improved on its own down to about systolic of 660 without intervention since initially she was allowed to have permissive hypertension while MRI imaging was pending. MRI brain resulted without any acute abnormalities. Had moderate stenosis at both PCA P1-P2 junction  Discharge Diagnoses:  Principal Problem:   Hypertensive urgency Active Problems:   CAD (coronary artery disease)   Statin intolerance   Acute kidney injury superimposed on chronic kidney disease (HCC)   Dizziness secondary to Hypertensive urgency  -MRI brain and MRA head negative -pt replaced metoprolol with diltiazem rather than adding it  in addition as intended by cardiology -She was asked to continue both metoprolol and statin.  And she will follow-up with Dr. Oval Linsey in 1 month. - Hx of PVC -continue metoprolol and diltiazem  CAD s/p CABG -Asymptomatic  AKI on CKD stage 2 - Cr of 1.11 from prior 0.87 on admission -Creatinine 0.99 on discharge after 500 cc bolus.  HLD  -Statin currently on hold by cardiology due to possible myalgia  Estimated body mass index is 23.34 kg/m as calculated from the following:   Height as of this encounter: 5' 0.25" (1.53 m).   Weight as of this encounter: 54.7 kg.  Discharge Instructions  Discharge Instructions    Diet - low sodium heart healthy   Complete by: As directed    Increase activity slowly   Complete by: As directed      Allergies as of 07/18/2020      Reactions   Contrast Media [  iodinated Diagnostic Agents] Swelling   Of face, especially eyes   Gadobutrol Other (See Comments)   Felt extremities go numb briefly as contrast injected.  Had cough (bronchospasm?) briefly after scan.  Resolved on own without treatment.  07/13/15   Gadolinium Derivatives Other (See Comments)   Felt extremities go numb  briefly as contrast injected.  Had cough (bronchospasm?) briefly after scan.  Resolved on own without treatment.  07/13/15   Lidocaine Other (See Comments)   Facial swelling after dental procedure   Pravachol [pravastatin Sodium]    Latex Rash, Other (See Comments)   and irritation      Medication List    TAKE these medications   acetaminophen 500 MG tablet Commonly known as: TYLENOL Take 2 tablets (1,000 mg total) by mouth every 6 (six) hours as needed. What changed: reasons to take this   aspirin 81 MG tablet Take 81 mg by mouth at bedtime.   diltiazem 120 MG 24 hr capsule Commonly known as: CARDIZEM CD Take 1 capsule (120 mg total) by mouth daily.   GNP CAL MAG ZINC +D3 PO Take 1 tablet by mouth in the morning and at bedtime.   metoprolol tartrate 25 MG tablet Commonly known as: LOPRESSOR Take 1 tablet (25 mg total) by mouth 2 (two) times daily.   NONI (MORINDA CITRIFOLIA) PO Take 600 mg by mouth 2 (two) times daily.   OVER THE COUNTER MEDICATION Take 1 capsule by mouth in the morning and at bedtime. [place]   rosuvastatin 5 MG tablet Commonly known as: CRESTOR Take 1 tablet (5 mg total) by mouth daily.   Vitamin D 50 MCG (2000 UT) tablet Take 4,000 Units by mouth daily.       Follow-up Information    Glendale Chard, MD Follow up.   Specialty: Internal Medicine Contact information: [address] [place] [address] [phone]        Skeet Latch, MD .   Specialty: Cardiology Contact information: [address] [place] [address] [phone]              Allergies  Allergen Reactions  . Contrast Media [Iodinated Diagnostic Agents] Swelling    Of face, especially eyes  . Gadobutrol Other (See Comments)    Felt extremities go numb briefly as contrast injected.  Had cough (bronchospasm?) briefly after scan.  Resolved on own without treatment.  07/13/15  . Gadolinium Derivatives Other (See Comments)     Felt extremities go numb briefly as contrast injected.  Had cough (bronchospasm?) briefly after scan.  Resolved on own without treatment.  07/13/15  . Lidocaine Other (See Comments)    Facial swelling after dental procedure  . Pravachol [Pravastatin Sodium]   . Latex Rash and Other (See Comments)    and irritation    Consultations: None   Procedures/Studies: DG Chest 2 View  Result Date: 07/17/2020 CLINICAL DATA:  Dizziness. EXAM: CHEST - 2 VIEW COMPARISON:  May 08, 2020. FINDINGS: Stable cardiomediastinal silhouette. No pneumothorax is noted. Sternotomy wires are noted. Small bilateral pleural effusions are noted. Lungs are clear. Bony thorax is unremarkable. IMPRESSION: Small bilateral pleural effusions. No other significant abnormality seen. Electronically Signed   By: Marijo Conception M.D.   On: 07/17/2020 16:13   CT Head Wo Contrast  Result Date: 07/17/2020 CLINICAL DATA:  Balance difficulties for 2 days EXAM: CT HEAD WITHOUT CONTRAST TECHNIQUE: Contiguous axial images were obtained from the base of the skull through the vertex without intravenous  contrast. COMPARISON:  06/24/2015 FINDINGS: Brain: No evidence of acute infarction, hemorrhage, hydrocephalus, extra-axial collection or mass lesion/mass effect. Patchy chronic white matter ischemic changes noted. Vascular: No hyperdense vessel or unexpected calcification. Skull: Normal. Negative for fracture or focal lesion. Sinuses/Orbits: No acute finding. Other: None. IMPRESSION: Chronic white matter ischemic change. No acute abnormality noted. Electronically Signed   By: Inez Catalina M.D.   On: 07/17/2020 16:29   MR ANGIO HEAD WO CONTRAST  Result Date: 07/17/2020 CLINICAL DATA:  Ataxia and right-sided weakness EXAM: MRI HEAD WITHOUT CONTRAST MRA HEAD WITHOUT CONTRAST TECHNIQUE: Multiplanar, multi-echo pulse sequences of the brain and surrounding structures were acquired without intravenous contrast. Angiographic images of the [place] were acquired using MRA technique without intravenous contrast. COMPARISON: No pertinent prior exam. COMPARISON:  07/13/2015 FINDINGS: MRI HEAD FINDINGS Brain: No acute infarct, mass effect or extra-axial collection. No acute or chronic hemorrhage. There is multifocal hyperintense T2-weighted signal within the white matter. Parenchymal volume and CSF spaces are normal. The midline structures are normal. Vascular: Major flow voids are preserved. Skull and upper cervical spine: Normal calvarium and skull base. Visualized upper cervical spine and soft tissues are normal. Sinuses/Orbits:No paranasal sinus fluid levels or advanced mucosal thickening. No mastoid or middle ear effusion. Normal orbits. MRA HEAD FINDINGS POSTERIOR CIRCULATION: --Vertebral arteries: Normal right V4 segment. Left V4 segment is again not visualized. --Inferior cerebellar arteries: Normal. --Basilar artery: Normal. --Superior cerebellar arteries: Normal. --Posterior cerebral arteries: Moderate stenosis at both P1 P2 junctions. ANTERIOR CIRCULATION: --Intracranial internal carotid arteries: Normal. --Anterior cerebral arteries (ACA): Normal. --Middle cerebral arteries (MCA): Normal. ANATOMIC VARIANTS: Fetal origin of the right PCA. IMPRESSION: 1. No acute intracranial abnormality. 2. Chronic small vessel ischemic changes. 3. No emergent large vessel occlusion. 4. Moderate stenosis at both PCA P1-P2 junctions. Electronically Signed   By: Ulyses Jarred M.D.   On: 07/17/2020 20:00   MR BRAIN WO CONTRAST  Result Date: 07/17/2020 CLINICAL DATA:  Ataxia and right-sided weakness EXAM: MRI HEAD WITHOUT CONTRAST MRA HEAD WITHOUT CONTRAST TECHNIQUE: Multiplanar, multi-echo pulse sequences of the brain and surrounding structures were acquired without intravenous contrast. Angiographic images of the Circle of [place] were acquired using MRA technique without intravenous contrast. COMPARISON: No pertinent prior exam. COMPARISON:  07/13/2015  FINDINGS: MRI HEAD FINDINGS Brain: No acute infarct, mass effect or extra-axial collection. No acute or chronic hemorrhage. There is multifocal hyperintense T2-weighted signal within the white matter. Parenchymal volume and CSF spaces are normal. The midline structures are normal. Vascular: Major flow voids are preserved. Skull and upper cervical spine: Normal calvarium and skull base. Visualized upper cervical spine and soft tissues are normal. Sinuses/Orbits:No paranasal sinus fluid levels or advanced mucosal thickening. No mastoid or middle ear effusion. Normal orbits. MRA HEAD FINDINGS POSTERIOR CIRCULATION: --Vertebral arteries: Normal right V4 segment. Left V4 segment is again not visualized. --Inferior cerebellar arteries: Normal. --Basilar artery: Normal. --Superior cerebellar arteries: Normal. --Posterior cerebral arteries: Moderate stenosis at both P1 P2 junctions. ANTERIOR CIRCULATION: --Intracranial internal carotid arteries: Normal. --Anterior cerebral arteries (ACA): Normal. --Middle cerebral arteries (MCA): Normal. ANATOMIC VARIANTS: Fetal origin of the right PCA. IMPRESSION: 1. No acute intracranial abnormality. 2. Chronic small vessel ischemic changes. 3. No emergent large vessel occlusion. 4. Moderate stenosis at both PCA P1-P2 junctions. Electronically Signed   By: Ulyses Jarred M.D.   On: 07/17/2020 20:00    (Echo, Carotid, EGD, Colonoscopy, ERCP)    Subjective: Patient standing up brushing her teeth denies dizziness  Discharge Exam: Vitals:  07/18/20 0435 07/18/20 0820  BP: (!) 148/82 (!) 161/83  Pulse: 60 65  Resp: 18 18  Temp: 98.5 F (36.9 C) 98.8 F (37.1 C)  SpO2: 99% 100%   Vitals:   07/18/20 0030 07/18/20 0112 07/18/20 0435 07/18/20 0820  BP: (!) 150/81 (!) 179/104 (!) 148/82 (!) 161/83  Pulse: 67 67 60 65  Resp: (!) 23 20 18 18   Temp:  98.6 F (37 C) 98.5 F (36.9 C) 98.8 F (37.1 C)  TempSrc:  Oral Oral Oral  SpO2: 97% 99% 99% 100%  Weight:  54.7 kg     Height:        General: Pt is alert, awake, not in acute distress Cardiovascular: RRR, S1/S2 +, no rubs, no gallops Respiratory: CTA bilaterally, no wheezing, no rhonchi Abdominal: Soft, NT, ND, bowel sounds + Extremities: no edema, no cyanosis    The results of significant diagnostics from this hospitalization (including imaging, microbiology, ancillary and laboratory) are listed below for reference.     Microbiology: Recent Results (from the past 240 hour(s))  Resp Panel by RT-PCR (Flu A&B, Covid) Nasopharyngeal Swab     Status: None   Collection Time: 07/17/20  4:41 PM   Specimen: Nasopharyngeal Swab; Nasopharyngeal(NP) swabs in vial transport medium  Result Value Ref Range Status   SARS Coronavirus 2 by RT PCR NEGATIVE NEGATIVE Final    Comment: (NOTE) SARS-CoV-2 target nucleic acids are NOT DETECTED.  The SARS-CoV-2 RNA is generally detectable in upper respiratory specimens during the acute phase of infection. The lowest concentration of SARS-CoV-2 viral copies this assay can detect is 138 copies/mL. A negative result does not preclude SARS-Cov-2 infection and should not be used as the sole basis for treatment or other patient management decisions. A negative result may occur with  improper specimen collection/handling, submission of specimen other than nasopharyngeal swab, presence of viral mutation(s) within the areas targeted by this assay, and inadequate number of viral copies(<138 copies/mL). A negative result must be combined with clinical observations, patient history, and epidemiological information. The expected result is Negative.  Fact Sheet for Patients:  EntrepreneurPulse.com.au  Fact Sheet for Healthcare Providers:  IncredibleEmployment.be  This test is no t yet approved or cleared by the [place] FDA and  has been authorized for detection and/or diagnosis of SARS-CoV-2 by FDA under an Emergency Use  Authorization (EUA). This EUA will remain  in effect (meaning this test can be used) for the duration of the COVID-19 declaration under Section 564(b)(1) of the Act, 21 U.S.C.section 360bbb-3(b)(1), unless the authorization is terminated  or revoked sooner.       Influenza A by PCR NEGATIVE NEGATIVE Final   Influenza B by PCR NEGATIVE NEGATIVE Final    Comment: (NOTE) The Xpert Xpress SARS-CoV-2/FLU/RSV plus assay is intended as an aid in the diagnosis of influenza from Nasopharyngeal swab specimens and should not be used as a sole basis for treatment. Nasal washings and aspirates are unacceptable for Xpert Xpress SARS-CoV-2/FLU/RSV testing.  Fact Sheet for Patients: EntrepreneurPulse.com.au  Fact Sheet for Healthcare Providers: IncredibleEmployment.be  This test is not yet approved or cleared by the [place] FDA and has been authorized for detection and/or diagnosis of SARS-CoV-2 by FDA under an Emergency Use Authorization (EUA). This EUA will remain in effect (meaning this test can be used) for the duration of the COVID-19 declaration under Section 564(b)(1) of the Act, 21 U.S.C. section 360bbb-3(b)(1), unless the authorization is terminated or revoked.  Performed at [facility],  [address] [place], [place] [address]      Labs: BNP (last 3 results) No results for input(s): BNP in the last 8760 hours. Basic Metabolic Panel: Recent Labs  Lab 07/17/20 1531 07/18/20 0403  NA 139 137  K 3.9 3.6  CL 104 105  CO2 26 23  GLUCOSE 93 97  BUN 12 14  CREATININE 1.11* 0.99  CALCIUM 10.0 10.0   Liver Function Tests: Recent Labs  Lab 07/17/20 1531  AST 21  ALT 14  ALKPHOS 70  BILITOT 1.0  PROT 7.2  ALBUMIN 4.0   No results for input(s): LIPASE, AMYLASE in the last 168 hours. No results for input(s): AMMONIA in the last 168 hours. CBC: Recent Labs  Lab 07/17/20 1531 07/18/20 0403  WBC 6.6 6.7  HGB 12.9 12.5   HCT 39.8 38.3  MCV 90.0 89.5  PLT 214 205   Cardiac Enzymes: No results for input(s): CKTOTAL, CKMB, CKMBINDEX, TROPONINI in the last 168 hours. BNP: Invalid input(s): POCBNP CBG: No results for input(s): GLUCAP in the last 168 hours. D-Dimer No results for input(s): DDIMER in the last 72 hours. Hgb A1c No results for input(s): HGBA1C in the last 72 hours. Lipid Profile No results for input(s): CHOL, HDL, LDLCALC, TRIG, CHOLHDL, LDLDIRECT in the last 72 hours. Thyroid function studies No results for input(s): TSH, T4TOTAL, T3FREE, THYROIDAB in the last 72 hours.  Invalid input(s): FREET3 Anemia work up No results for input(s): VITAMINB12, FOLATE, FERRITIN, TIBC, IRON, RETICCTPCT in the last 72 hours. Urinalysis    Component Value Date/Time   COLORURINE YELLOW 04/21/2020 1425   APPEARANCEUR HAZY (A) 04/21/2020 1425   LABSPEC 1.013 04/21/2020 1425   PHURINE 7.0 04/21/2020 1425   GLUCOSEU NEGATIVE 04/21/2020 1425   HGBUR NEGATIVE 04/21/2020 1425   BILIRUBINUR negative 06/26/2020 0936   KETONESUR NEGATIVE 04/21/2020 1425   PROTEINUR Positive (A) 06/26/2020 0936   PROTEINUR NEGATIVE 04/21/2020 1425   UROBILINOGEN 0.2 06/26/2020 0936   NITRITE negative 06/26/2020 0936   NITRITE NEGATIVE 04/21/2020 1425   LEUKOCYTESUR Negative 06/26/2020 0936   LEUKOCYTESUR NEGATIVE 04/21/2020 1425   Sepsis Labs Invalid input(s): PROCALCITONIN,  WBC,  LACTICIDVEN Microbiology Recent Results (from the past 240 hour(s))  Resp Panel by RT-PCR (Flu A&B, Covid) Nasopharyngeal Swab     Status: None   Collection Time: 07/17/20  4:41 PM   Specimen: Nasopharyngeal Swab; Nasopharyngeal(NP) swabs in vial transport medium  Result Value Ref Range Status   SARS Coronavirus 2 by RT PCR NEGATIVE NEGATIVE Final    Comment: (NOTE) SARS-CoV-2 target nucleic acids are NOT DETECTED.  The SARS-CoV-2 RNA is generally detectable in upper respiratory specimens during the acute phase of infection. The  lowest concentration of SARS-CoV-2 viral copies this assay can detect is 138 copies/mL. A negative result does not preclude SARS-Cov-2 infection and should not be used as the sole basis for treatment or other patient management decisions. A negative result may occur with  improper specimen collection/handling, submission of specimen other than nasopharyngeal swab, presence of viral mutation(s) within the areas targeted by this assay, and inadequate number of viral copies(<138 copies/mL). A negative result must be combined with clinical observations, patient history, and epidemiological information. The expected result is Negative.  Fact Sheet for Patients:  EntrepreneurPulse.com.au  Fact Sheet for Healthcare Providers:  IncredibleEmployment.be  This test is no t yet approved or cleared by the [place] FDA and  has been authorized for detection and/or diagnosis of SARS-CoV-2 by FDA under an Emergency  Use Authorization (EUA). This EUA will remain  in effect (meaning this test can be used) for the duration of the COVID-19 declaration under Section 564(b)(1) of the Act, 21 U.S.C.section 360bbb-3(b)(1), unless the authorization is terminated  or revoked sooner.       Influenza A by PCR NEGATIVE NEGATIVE Final   Influenza B by PCR NEGATIVE NEGATIVE Final    Comment: (NOTE) The Xpert Xpress SARS-CoV-2/FLU/RSV plus assay is intended as an aid in the diagnosis of influenza from Nasopharyngeal swab specimens and should not be used as a sole basis for treatment. Nasal washings and aspirates are unacceptable for Xpert Xpress SARS-CoV-2/FLU/RSV testing.  Fact Sheet for Patients: EntrepreneurPulse.com.au  Fact Sheet for Healthcare Providers: IncredibleEmployment.be  This test is not yet approved or cleared by the [place] FDA and has been authorized for detection and/or diagnosis of SARS-CoV-2 by FDA under  an Emergency Use Authorization (EUA). This EUA will remain in effect (meaning this test can be used) for the duration of the COVID-19 declaration under Section 564(b)(1) of the Act, 21 U.S.C. section 360bbb-3(b)(1), unless the authorization is terminated or revoked.  Performed at [facility], [place] [address] [place], [place] [address]      Time coordinating discharge:  39 minutes  SIGNED:   Georgette Shell, MD  [facility] 07/18/2020, 10:30 AM

## 2020-07-18 NOTE — Progress Notes (Signed)
Patient arrived to 5C13 from ED via stretcher, Safety precautions and orders reviewed with pt. TELE applied and confirmed. [place] in place per request. Pt request for tylenol to aid with sleep. MD notified. No other concern voice or distress noted. Will continue to monitor.

## 2020-07-23 ENCOUNTER — Telehealth: Payer: Self-pay

## 2020-07-23 NOTE — Telephone Encounter (Signed)
PATIENT OFFERED APPT. DECLINED DUE TO BEING IN [place] WITH HER KIDS.

## 2020-08-08 ENCOUNTER — Other Ambulatory Visit: Payer: Self-pay | Admitting: Physician Assistant

## 2020-08-24 DIAGNOSIS — H35033 Hypertensive retinopathy, bilateral: Secondary | ICD-10-CM | POA: Diagnosis not present

## 2020-08-24 DIAGNOSIS — H524 Presbyopia: Secondary | ICD-10-CM | POA: Diagnosis not present

## 2020-08-24 DIAGNOSIS — H43813 Vitreous degeneration, bilateral: Secondary | ICD-10-CM | POA: Diagnosis not present

## 2020-08-24 DIAGNOSIS — H3561 Retinal hemorrhage, right eye: Secondary | ICD-10-CM | POA: Diagnosis not present

## 2020-08-24 DIAGNOSIS — H35013 Changes in retinal vascular appearance, bilateral: Secondary | ICD-10-CM | POA: Diagnosis not present

## 2020-08-24 DIAGNOSIS — H35362 Drusen (degenerative) of macula, left eye: Secondary | ICD-10-CM | POA: Diagnosis not present

## 2020-08-26 ENCOUNTER — Encounter: Payer: Self-pay | Admitting: Internal Medicine

## 2020-08-30 NOTE — Progress Notes (Addendum)
Cardiology Office Note:    Date:  09/07/2020   ID:  Virginia Delgado, DOB [birth year], MRN [id number]  PCP:  Glendale Chard, MD  Cardiologist:  Skeet Latch, MD  Electrophysiologist:  None   Referring MD: Glendale Chard, MD   Chief Complaint: follow-up of CAD, hypertension, and dizziness  History of Present Illness:    Virginia Delgado is a 75 y.o. female with a history of CAD s/p CABG x4 (LIMA to LAD, RIMA to left PDA, sequential radial artery to OM and ramus intermedius) in 03/2020, hypertension, hyperlipidemia, and Hepatitis C who is followed by Dr. Oval Linsey and presents today for follow-up of CAD, hypertension, and dizziness.    Patient underwent cardiac catheterization in 03/2020 for further evaluation of exertional chest pain which showed severe 3 vessel CAD including ostial LAD. Echo showed LVEF of 65-70% with normal wall motion and mild AI. CT surgery was consulted and patient underwent CABG x 4 (LIMA to LAD, RIMA to left PDA, sequential radial artery to OM and ramus intermedius) on 04/22/2020. Post-op course was unremarkable. She was started on IV Nitro following surgery and then transitioned to Isordil given radial artery graft. She maintained sinus rhythm during post-op period. She was seen by CT surgery for follow-up on 04/26/2020 at which time she reported a persistent non-productive cough. She was started on Mucinex to help facilitate better cough and loosen up sputum.  Patient has been seen multiple times for follow-up since her CABG. I saw her in 05/2020 at which time she denied any angina but did note some lightheadedness/dizziness that would some time last for hours and was not associated with position changes. Zio Monitor was ordered to rule out NSVT or atrial fibrillation/flutter as cause of lightheadedness/dizziness. Her BP was elevated in the office; however, given reports of lightheadedness/dizziness no medication changes were made and she was advised to keep a BP/HR log for 2 weeks.  Monitor showed underlying sinus rhythm with 13 short runs of non-sustained VT (longest being 5 beats) and 57 short runs of SVT (longest being 7 beats) as well as some PACs/PVCs with some ventricular bigeminy and trigeminy. She was seen again by Coletta Memos, NP, on 07/15/2020 at which time she noted elevated BP at Cardiac Rehab as well as intermittent fluttering with her PVCs/PACs. She was started on Cardizem 120mg  daily in addition to Metoprolol which she was already taking for additional BP control and to help with palpitations.She called our office on 07/17/2020 with reports that she was extremely off balance after starting the Cardizem and her BP was more elevated. She was advised to go to the ED for further evaluation of balance issues. Upon arrival to the ED, BP was 200/120. She was admitted for hypertensive urgency. Dizziness was felt to be secondary to hypertension. Brain MRI and head MRA were negative. Patient had not been taking Metoprolol with the Diltiazem so she was  started back on both and advised to follow-up with Cardiology.  Patient presents today for follow-up. Here alone. Patient states that about 1 week ago, she increased her Lopressor to 50mg  twice daily and has not been taking her Diltiazem. Since then, she has not been having any more palpitations. Before this, she was having a lot of "heart fluttering" lasting a minimum of 10 seconds. However, this has improved with increase in Lopressor. No lightheadedness, dizziness, or syncope. She also reports some "discomfort" in her breasts that she describes as a "numbing" sensation. Improves when she presses on her breast. No other chest pain.  No exertional symptoms. She does states she has been more activity lately. Suspect musculoskeletal in nature. No shortness of breath. She describes a few episodes of PND since her CABG. She states this occurs randomly and she has only had about 5 episodes in the last 5 months. No lower extremity edema. She is  still having difficulty ambulating and states she just feels very off balance. She is still ambulating with a cane.    Past Medical History:  Diagnosis Date   Coronary artery calcification 04/05/2019   Calcium score 93rd percentile 08/2018.   Dermatitis    Hx of hepatitis C    Hypertension     Past Surgical History:  Procedure Laterality Date   BUNIONECTOMY Bilateral    CESAREAN SECTION  1976   CESAREAN SECTION  1979   CLIPPING OF ATRIAL APPENDAGE Left 04/22/2020   Procedure: CLIPPING OF  LEFT ATRIAL APPENDAGE USING ATRICURE ATRICLIPFLEX-V 35;  Surgeon: Wonda Olds, MD;  Location: [facility];  Service: Open Heart Surgery;  Laterality: Left;   CORONARY ARTERY BYPASS GRAFT N/A 04/22/2020   Procedure: CORONARY ARTERY BYPASS GRAFTING (CABGX4). USING BILATERAL MAMMARIES;  Surgeon: Wonda Olds, MD;  Location: [facility];  Service: Open Heart Surgery;  Laterality: N/A;  BIMA   LEFT HEART CATH AND CORONARY ANGIOGRAPHY N/A 04/20/2020   Procedure: LEFT HEART CATH AND CORONARY ANGIOGRAPHY;  Surgeon: Nelva Bush, MD;  Location: [facility];  Service: Cardiovascular;  Laterality: N/A;   RADIAL ARTERY HARVEST Right 04/22/2020   Procedure: RIGHT RADIAL ARTERY HARVESTING.;  Surgeon: Wonda Olds, MD;  Location: [facility];  Service: Open Heart Surgery;  Laterality: Right;   TEE WITHOUT CARDIOVERSION N/A 04/22/2020   Procedure: TRANSESOPHAGEAL ECHOCARDIOGRAM (TEE);  Surgeon: Wonda Olds, MD;  Location: [facility];  Service: Open Heart Surgery;  Laterality: N/A;   UMBILICAL HERNIA REPAIR  1981    Current Medications: Current Meds  Medication Sig   acetaminophen (TYLENOL) 500 MG tablet Take 2 tablets (1,000 mg total) by mouth every 6 (six) hours as needed. (Patient taking differently: Take 1,000 mg by mouth every 6 (six) hours as needed for mild pain.)   amLODipine (NORVASC) 5 MG tablet Take 1 tablet (5 mg total) by mouth daily.   aspirin 81 MG tablet Take 81 mg by mouth at bedtime.    Cholecalciferol (VITAMIN D) 50 MCG (2000 UT) tablet Take 4,000 Units by mouth daily.   losartan (COZAAR) 50 MG tablet Take 50 mg by mouth daily. Take 1 Tablet Daily   metoprolol tartrate (LOPRESSOR) 50 MG tablet Take 50 mg by mouth 2 (two) times daily. Take 1 Tablet Twice Daily   Multiple Minerals-Vitamins (GNP CAL MAG ZINC +D3 PO) Take 1 tablet by mouth in the morning and at bedtime.   NONI, MORINDA CITRIFOLIA, PO Take 600 mg by mouth 2 (two) times daily.   [DISCONTINUED] losartan (COZAAR) 25 MG tablet Take 50 mg by mouth daily. Take 1 Tablet twice daily.   [DISCONTINUED] metoprolol tartrate (LOPRESSOR) 25 MG tablet Take 1 tablet (25 mg total) by mouth 2 (two) times daily.     Allergies:   Contrast media [iodinated diagnostic agents], Gadobutrol, Gadolinium derivatives, Lidocaine, Pravachol [pravastatin sodium], and Latex   Social History   Socioeconomic History   Marital status: Divorced    Spouse name: Not on file   Number of children: Not on file   Years of education: Not on file   Highest education level: Not on file  Occupational History  Not on file  Tobacco Use   Smoking status: Never   Smokeless tobacco: Never  Vaping Use   Vaping Use: Never used  Substance and Sexual Activity   Alcohol use: Yes    Comment: social   Drug use: No   Sexual activity: Yes    Birth control/protection: None  Other Topics Concern   Not on file  Social History Narrative   Not on file   Social Determinants of Health   Financial Resource Strain: Medium Risk   Difficulty of Paying Living Expenses: Somewhat hard  Food Insecurity: No Food Insecurity   Worried About Charity fundraiser in the Last Year: Never true   Ran Out of Food in the Last Year: Never true  Transportation Needs: No Transportation Needs   Lack of Transportation (Medical): No   Lack of Transportation (Non-Medical): No  Physical Activity: Inactive   Days of Exercise per Week: 0 days   Minutes of Exercise per Session: 0  min  Stress: No Stress Concern Present   Feeling of Stress : Not at all  Social Connections: Not on file     Family History: The patient's family history includes COPD in her mother; Cancer in her mother and sister; Heart disease in her father; Hypertension in her brother and sister; Ovarian cancer in her maternal grandmother; Rheum arthritis in her paternal grandmother.  ROS:   Please see the history of present illness.     EKGs/Labs/Other Studies Reviewed:    The following studies were reviewed today:  Left Cardiac Catheterization 04/20/2020: Conclusions: Severe three-vessel coronary artery disease, including 80-90% ostial/proximal LAD stenosis, 70-90% lesions of ostial/proximal D1, D2, and ramus intermedius, and diffuse mid/distal LCx disease of up to 80% (dominant LCx).  90% stenosis of nondominant RCA is also present. Normal left ventricular contraction with mildly elevated filling pressure.   Recommendations: Given significant three-vessel disease, including ostial LAD involvement, recommend cardiac surgery consultation for CABG.  Given rapid progression of symptoms (often present at rest), patient will be admitted for inpatient surgical evaluation. Aggressive secondary prevention, including addition of statin. Obtain echocardiogram. _______________   Echocardiogram 04/20/2020: Impressions: 1. Left ventricular ejection fraction, by estimation, is 65 to 70%. The  left ventricle has normal function. The left ventricle has no regional  wall motion abnormalities. Left ventricular diastolic parameters are  indeterminate.   2. Right ventricular systolic function is normal. The right ventricular  size is normal. There is normal pulmonary artery systolic pressure.   3. The mitral valve is normal in structure. Mild mitral valve  regurgitation.   4. The aortic valve is tricuspid. Aortic valve regurgitation is mild.  Mild to moderate aortic valve sclerosis/calcification is present,  without  any evidence of aortic stenosis.   5. The inferior vena cava is normal in size with greater than 50%  respiratory variability, suggesting right atrial pressure of 3 mmHg.  EKG:  EKG ordered today. EKG personally reviewed and demonstrates normal sinus rhythm with PACs and PVCs. Rate 73 bpm. No acute ST/T changes. Normal axis. Normal PR and QRS intervals. QTc 445 ms.   Recent Labs: 06/16/2020: Magnesium 2.3 06/26/2020: TSH 1.090 07/17/2020: ALT 14 07/18/2020: Hemoglobin 12.5; Platelets 205 09/04/2020: BUN 18; Creatinine, Ser 0.91; Potassium 4.0; Sodium 140  Recent Lipid Panel    Component Value Date/Time   CHOL 219 (H) 04/13/2020 0929   TRIG 68 04/13/2020 0929   HDL 82 04/13/2020 0929   CHOLHDL 2.7 04/13/2020 0929   LDLCALC 125 (H) 04/13/2020 1610  Physical Exam:    Vital Signs: BP 126/62 (BP Location: Left Arm, Patient Position: Sitting, Cuff Size: Normal)   Pulse (!) 54   Ht 5' (1.524 m)   Wt 122 lb 9.6 oz (55.6 kg)   SpO2 98%   BMI 23.94 kg/m     Wt Readings from Last 3 Encounters:  09/07/20 122 lb 9.6 oz (55.6 kg)  07/18/20 120 lb 8 oz (54.7 kg)  07/15/20 124 lb (56.2 kg)     General: 75 y.o. African-American female in no acute distress. HEENT: Normocephalic and atraumatic. Sclera clear.  Neck: Supple. No JVD. Heart: Irregular with normal rate. Distinct S1 and S2. No significant murmurs, gallops, or rubs. Radial pulses 2+ and equal bilaterally. Lungs: No increased work of breathing. Clear to ausculation bilaterally. No wheezes, rhonchi, or rales.  Abdomen: Soft, non-distended, and non-tender to palpation.  MSK: Ambulates with a walker. Extremities: No lower extremity edema.    Skin: Warm and dry. Neuro: Alert and oriented x3. No focal deficits. Psych: Normal affect. Responds appropriately.  Assessment:    1. Coronary artery disease involving native coronary artery of native heart without angina pectoris   2. S/P CABG (coronary artery bypass graft)   3.  NSVT (nonsustained ventricular tachycardia) (HCC)   4. Paroxysmal SVT (supraventricular tachycardia) ([place])   5. Primary hypertension   6. Hyperlipidemia, unspecified hyperlipidemia type     Plan:    CAD s/p CABG - S/p CABG x4 in 03/2020. - Atypical pain that sounds musculoskeletal in nature but no angina.  - Continue aspirin, beta-blocker, and statin. - She is still having a lot of balance issues since surgery and is now ambulating with a cane. She took a break from Cardiac Rehab but is planning on restarting this.   Of note, patient asked when she can travel again (she wants to go to [place]) and when she can resume sexual activity. She is almost 5 months out from surgery and overall doing well. Therefore, I advised her that she should be fine for both of these activities. However, she would feel better if I discussed this with Dr. Oval Linsey so I will confirm with Dr. Oval Linsey and update patient.   Non-Sustained VT Paroxsymal SVT PACs/PVCs - Recent monitor in 05/2020 showed underlying sinus rhythm with 13 short runs of non-sustained VT (longest being 5 beats) and 57 short runs of SVT (longest being 7 beats) as well as some PACs/PVCs with some ventricular bigeminy and trigeminy.  - Improved after increase in beta-blocker. - Continue Lopressor 50mg  twice daily. - Continue Cardizem CD 120mg  daily.  I spent time with patient going over recent monitor results. She was wondering if she needed to be referred to EP. Given that monitored just showed short runs of NSVT and paroxysmal SVT and no atrial fibrillation and symptoms have improved with increase in Lopressor, I explained that I don't think this is necessary at this time.  Hypertension - BP well controlled in the office but running high at home. Systolic BP in the 941D to 170s over the last couple of weeks with one reading of 221/110 on 08/22/2020. - Continue Lopressor 50mg  twice daily. - Continue Losartan 50mg  daily. - Will add Amlodipine 5mg   daily. - Asked patient to keep BP/HR log for 2 weeks and then send this to [place] via MyChart.   Hyperlipidemia - Lipid panel in 03/2020: Total Cholesterol 219, Triglycerides 68, HDL 82, LDL 125. - LDL goal <70 given CAD. - Patient has not been able to  tolerate statins in the past. At my last visit with her, I tried to restart Crestor at 5mg  daily but she was unable to tolerate this.  - Discussed trying Zetia. Patient would like to recheck a lipid panel prior to this as her diet has changed since the last time it was checked. Patient is not fasting today but she will come back for lipid panel.  Disposition: Follow up in 6 months.  ADDENDUM 09/14/2020 5:09PM: Spoke with Dr. Oval Linsey who agreed patient is OK to resume travel and sexual activity.   Medication Adjustments/Labs and Tests Ordered: Current medicines are reviewed at length with the patient today.  Concerns regarding medicines are outlined above.  Orders Placed This Encounter  Procedures   Lipid panel   EKG 12-Lead    Meds ordered this encounter  Medications   amLODipine (NORVASC) 5 MG tablet    Sig: Take 1 tablet (5 mg total) by mouth daily.    Dispense:  180 tablet    Refill:  3     Patient Instructions  Medication Instructions:  Start Amlodipine 5 mg ( 1 Tablet Daily). *If you need a refill on your cardiac medications before your next appointment, please call your pharmacy*   Lab Work: Lipid Panel. If you have labs (blood work) drawn today and your tests are completely normal, you will receive your results only by: [place] (if you have MyChart) OR A paper copy in the mail If you have any lab test that is abnormal or we need to change your treatment, we will call you to review the results.   Testing/Procedures: No Testing   Follow-Up: At [facility], you and your health needs are our priority.  As part of our continuing mission to provide you with exceptional heart care, we have created designated  Provider Care Teams.  These Care Teams include your primary Cardiologist (physician) and Advanced Practice Providers (APPs -  Physician Assistants and Nurse Practitioners) who all work together to provide you with the care you need, when you need it.  We recommend signing up for the patient portal called "MyChart".  Sign up information is provided on this After Visit Summary.  MyChart is used to connect with patients for Virtual Visits (Telemedicine).  Patients are able to view lab/test results, encounter notes, upcoming appointments, etc.  Non-urgent messages can be sent to your provider as well.   To learn more about what you can do with MyChart, go to [email or web address].    Your next appointment:   6 month(s)  The format for your next appointment:   In Person  Provider:   Skeet Latch, MD   Other Instructions Take Blood Pressure and Heart Rate Daily for 2 weeks. Can be Uploaded to [place], Darreld Mclean, PA-C  09/07/2020 1:21 PM    [place]

## 2020-09-01 MED ORDER — METOPROLOL TARTRATE 25 MG PO TABS: 25.0000 mg | ORAL_TABLET | Freq: Two times a day (BID) | ORAL | 3 refills | Status: DC

## 2020-09-04 ENCOUNTER — Other Ambulatory Visit: Payer: Self-pay

## 2020-09-04 ENCOUNTER — Other Ambulatory Visit: Payer: Self-pay | Admitting: *Deleted

## 2020-09-04 DIAGNOSIS — I10 Essential (primary) hypertension: Secondary | ICD-10-CM | POA: Diagnosis not present

## 2020-09-05 LAB — BASIC METABOLIC PANEL
BUN/Creatinine Ratio: 20 (ref 12–28)
BUN: 18 mg/dL (ref 8–27)
CO2: 26 mmol/L (ref 20–29)
Calcium: 10 mg/dL (ref 8.7–10.3)
Chloride: 101 mmol/L (ref 96–106)
Creatinine, Ser: 0.91 mg/dL (ref 0.57–1.00)
Glucose: 106 mg/dL — ABNORMAL HIGH (ref 65–99)
Potassium: 4 mmol/L (ref 3.5–5.2)
Sodium: 140 mmol/L (ref 134–144)
eGFR: 67 mL/min/{1.73_m2} (ref >59)

## 2020-09-07 ENCOUNTER — Encounter: Payer: Self-pay | Admitting: Student

## 2020-09-07 ENCOUNTER — Other Ambulatory Visit: Payer: Self-pay

## 2020-09-07 ENCOUNTER — Ambulatory Visit: Payer: Medicare HMO | Admitting: Student

## 2020-09-07 VITALS — BP 126/62 | HR 54 | Ht 60.0 in | Wt 122.6 lb

## 2020-09-07 DIAGNOSIS — I10 Essential (primary) hypertension: Secondary | ICD-10-CM

## 2020-09-07 DIAGNOSIS — I472 Ventricular tachycardia: Secondary | ICD-10-CM | POA: Diagnosis not present

## 2020-09-07 DIAGNOSIS — I4729 Other ventricular tachycardia: Secondary | ICD-10-CM

## 2020-09-07 DIAGNOSIS — I471 Supraventricular tachycardia: Secondary | ICD-10-CM | POA: Diagnosis not present

## 2020-09-07 DIAGNOSIS — E785 Hyperlipidemia, unspecified: Secondary | ICD-10-CM | POA: Diagnosis not present

## 2020-09-07 DIAGNOSIS — I2510 Atherosclerotic heart disease of native coronary artery without angina pectoris: Secondary | ICD-10-CM

## 2020-09-07 DIAGNOSIS — Z951 Presence of aortocoronary bypass graft: Secondary | ICD-10-CM

## 2020-09-07 MED ORDER — AMLODIPINE BESYLATE 5 MG PO TABS: 5.0000 mg | ORAL_TABLET | Freq: Every day | ORAL | 3 refills | Status: DC

## 2020-09-07 NOTE — Patient Instructions (Signed)
Medication Instructions:  Start Amlodipine 5 mg ( 1 Tablet Daily). *If you need a refill on your cardiac medications before your next appointment, please call your pharmacy*   Lab Work: Lipid Panel. If you have labs (blood work) drawn today and your tests are completely normal, you will receive your results only by: [place] (if you have MyChart) OR A paper copy in the mail If you have any lab test that is abnormal or we need to change your treatment, we will call you to review the results.   Testing/Procedures: No Testing   Follow-Up: At [facility], you and your health needs are our priority.  As part of our continuing mission to provide you with exceptional heart care, we have created designated Provider Care Teams.  These Care Teams include your primary Cardiologist (physician) and Advanced Practice Providers (APPs -  Physician Assistants and Nurse Practitioners) who all work together to provide you with the care you need, when you need it.  We recommend signing up for the patient portal called "MyChart".  Sign up information is provided on this After Visit Summary.  MyChart is used to connect with patients for Virtual Visits (Telemedicine).  Patients are able to view lab/test results, encounter notes, upcoming appointments, etc.  Non-urgent messages can be sent to your provider as well.   To learn more about what you can do with MyChart, go to [email or web address].    Your next appointment:   6 month(s)  The format for your next appointment:   In Person  Provider:   Skeet Latch, MD   Other Instructions Take Blood Pressure and Heart Rate Daily for 2 weeks. Can be Uploaded to [place]

## 2020-09-08 DIAGNOSIS — I2510 Atherosclerotic heart disease of native coronary artery without angina pectoris: Secondary | ICD-10-CM | POA: Diagnosis not present

## 2020-09-08 DIAGNOSIS — N1831 Chronic kidney disease, stage 3a: Secondary | ICD-10-CM | POA: Diagnosis not present

## 2020-09-08 DIAGNOSIS — I129 Hypertensive chronic kidney disease with stage 1 through stage 4 chronic kidney disease, or unspecified chronic kidney disease: Secondary | ICD-10-CM | POA: Diagnosis not present

## 2020-09-08 DIAGNOSIS — N390 Urinary tract infection, site not specified: Secondary | ICD-10-CM | POA: Diagnosis not present

## 2020-09-15 ENCOUNTER — Telehealth: Payer: Self-pay | Admitting: Cardiovascular Disease

## 2020-09-15 DIAGNOSIS — I471 Supraventricular tachycardia: Secondary | ICD-10-CM

## 2020-09-15 NOTE — Telephone Encounter (Signed)
Agree, can we get clarification on the pain she is having? If it is the same pain she described at office visit (discomfort/numbness around breast that improves when she presses on her breast), I don't think this is cardiac in nature. Likely musculoskeletal.   And OK to get booster from a cardiac standpoint.  Thank you!

## 2020-09-15 NOTE — Telephone Encounter (Signed)
Spoke to patient in regards to Mychart message-patient reports she was recently told she could return to normal activity.  She believes this was premature and she placed 4 table clothes on tables and then started experiencing chest pain (x3 days ago).   Pain occurs with exertion and at rest, reports pain is not consistent.   She normally sits down when she has this pain and takes tylenol-tylenol has been helping pain.   If she presses on her chest, the mid chest is "uncomfortable" which she reports being new for her.   Also reports some left neck stiffness but reports this does not correlate to chest pain.   Denies SOB, dizziness.   Denies pain currently.   Also reports previously being able to get to 1000 on incentive spirometer but now cant get over 500, denies this being due to pain.     Advised will send message to [facility] PA to review.

## 2020-09-15 NOTE — Telephone Encounter (Signed)
See phone note

## 2020-09-15 NOTE — Telephone Encounter (Signed)
I think this still sounds pretty atypical and like there is a musculoskeletal component. Patient can continue to take Tylenol for this. However, can we also make sure patient has sublingual Nitro at home? If patient continues to have intermittent chest pain, recommend an office visit. We could consider adding Imdur at that time.   Thank you!  Darreld Mclean, PA-C 09/15/2020 1:41 PM

## 2020-09-15 NOTE — Telephone Encounter (Signed)
Pt is returning My Chart message  to Triage Nurse. Please advise pt further

## 2020-09-16 DIAGNOSIS — R002 Palpitations: Secondary | ICD-10-CM

## 2020-09-16 DIAGNOSIS — I491 Atrial premature depolarization: Secondary | ICD-10-CM

## 2020-09-16 DIAGNOSIS — I493 Ventricular premature depolarization: Secondary | ICD-10-CM

## 2020-09-16 DIAGNOSIS — E785 Hyperlipidemia, unspecified: Secondary | ICD-10-CM

## 2020-09-16 DIAGNOSIS — I2510 Atherosclerotic heart disease of native coronary artery without angina pectoris: Secondary | ICD-10-CM

## 2020-09-16 MED ORDER — NITROGLYCERIN 0.4 MG SL SUBL: 0.4000 mg | SUBLINGUAL_TABLET | SUBLINGUAL | 3 refills | Status: DC | PRN

## 2020-09-16 NOTE — Addendum Note (Signed)
Addended by: Waylan Rocher on: 09/16/2020 08:03 AM   Modules accepted: Orders

## 2020-09-16 NOTE — Telephone Encounter (Signed)
See my chart message

## 2020-09-22 NOTE — Telephone Encounter (Signed)
I met with Virginia Delgado today (at her request, after speaking with check out staff) to discuss her questions about follow up care since her bypass surgery in February.  She is frustrated that she has not seen a physician in the interim, and does not feel her questions are being answered.  She has several thoughts about various medications (how long will she need to be on them, etc), as well as concerns about her ongoing PAC's/PVC's and palpitations.   She would like a referral to EP to discuss.   I also suggested she contact the office of patient experience to discuss her journey, and where she feels like communication and follow up has been lacking in her case.  I gave her the email address for patient experience and encouraged her to follow up through that venue.   

## 2020-09-24 ENCOUNTER — Encounter: Payer: Self-pay | Admitting: Internal Medicine

## 2020-09-24 DIAGNOSIS — Z01419 Encounter for gynecological examination (general) (routine) without abnormal findings: Secondary | ICD-10-CM | POA: Diagnosis not present

## 2020-09-24 DIAGNOSIS — E785 Hyperlipidemia, unspecified: Secondary | ICD-10-CM | POA: Diagnosis not present

## 2020-09-24 DIAGNOSIS — I10 Essential (primary) hypertension: Secondary | ICD-10-CM | POA: Diagnosis not present

## 2020-09-24 DIAGNOSIS — I472 Ventricular tachycardia: Secondary | ICD-10-CM | POA: Diagnosis not present

## 2020-09-24 DIAGNOSIS — Z951 Presence of aortocoronary bypass graft: Secondary | ICD-10-CM | POA: Diagnosis not present

## 2020-09-24 DIAGNOSIS — I471 Supraventricular tachycardia: Secondary | ICD-10-CM | POA: Diagnosis not present

## 2020-09-24 DIAGNOSIS — I2510 Atherosclerotic heart disease of native coronary artery without angina pectoris: Secondary | ICD-10-CM | POA: Diagnosis not present

## 2020-09-24 LAB — LIPID PANEL
Chol/HDL Ratio: 2.6 ratio (ref 0.0–4.4)
Cholesterol, Total: 206 mg/dL — ABNORMAL HIGH (ref 100–199)
HDL: 78 mg/dL (ref >39)
LDL Chol Calc (NIH): 119 mg/dL — ABNORMAL HIGH (ref 0–99)
Triglycerides: 49 mg/dL (ref 0–149)
VLDL Cholesterol Cal: 9 mg/dL (ref 5–40)

## 2020-09-24 NOTE — Telephone Encounter (Signed)
That's fine. Sharyn Lull, can you help place a referral to EP and let patient know they should be reaching out to her?  Thank you!

## 2020-09-25 NOTE — Addendum Note (Signed)
Addended by: Waylan Rocher on: 09/25/2020 11:31 AM   Modules accepted: Orders

## 2020-09-25 NOTE — Telephone Encounter (Signed)
Referral to EP entered.

## 2020-09-28 MED ORDER — EZETIMIBE 10 MG PO TABS: 10.0000 mg | ORAL_TABLET | Freq: Every day | ORAL | 3 refills | Status: DC

## 2020-09-28 NOTE — Addendum Note (Signed)
Addended by: Waylan Rocher on: 09/28/2020 07:32 AM   Modules accepted: Orders

## 2020-09-29 NOTE — Telephone Encounter (Signed)
Spoke with Caralyn Guile, LPN, about this on the phone yesterday. I think this is in response to the other MyChart message chain.   Sharyn Lull, if not already done can we just make sure patient is aware that EP referral has been sent but she probably won't be able to be seen this week. I would also still recommend she go ahead and see the lipid clinic if she is willing even if she does not want to start Zetia yet. Thanks so much for all your help coordinating all of this!

## 2020-10-01 DIAGNOSIS — Z0279 Encounter for issue of other medical certificate: Secondary | ICD-10-CM

## 2020-10-07 DIAGNOSIS — Z951 Presence of aortocoronary bypass graft: Secondary | ICD-10-CM | POA: Diagnosis not present

## 2020-10-07 DIAGNOSIS — Z48812 Encounter for surgical aftercare following surgery on the circulatory system: Secondary | ICD-10-CM | POA: Diagnosis not present

## 2020-10-08 ENCOUNTER — Telehealth: Payer: Self-pay | Admitting: Cardiovascular Disease

## 2020-10-08 DIAGNOSIS — Z48812 Encounter for surgical aftercare following surgery on the circulatory system: Secondary | ICD-10-CM | POA: Diagnosis not present

## 2020-10-08 DIAGNOSIS — Z951 Presence of aortocoronary bypass graft: Secondary | ICD-10-CM | POA: Diagnosis not present

## 2020-10-08 NOTE — Telephone Encounter (Signed)
Attempted to contact patient, left message to call back to office.   Will forward to see if forms have been received or faxed.

## 2020-10-08 NOTE — Telephone Encounter (Signed)
Patient states on 10/01/20 she came to the office and paid $29 to have an FMLA form faxed to her daughter's employer, but they never received the paperwork. She is following up on the status of the paperwork as she states it was due on yesterday, 10/07/20. Was the paperwork ever sent to daughter's employer? Please advise    Are you calling in reference to your FMLA or disability form?  Yes    What is your question in regards to FMLA or disability form?  See above   Do you need copies of your medical records?  Yes    Are you waiting on a nurse to call you back with results or are you wanting copies of your results?  No

## 2020-10-09 ENCOUNTER — Telehealth: Payer: Self-pay | Admitting: Cardiovascular Disease

## 2020-10-09 NOTE — Telephone Encounter (Signed)
Spoke with patients daughter Virginia Delgado. I informed her that the forms and payment she brought into the [facility] office were given to Dr.Quinn's nurse on 8/4 to take to [facility] office. Dr.Bloomington is not back in the office until next week. Gave daughter number to [facility] office if she had any questions she could call them.

## 2020-10-09 NOTE — Telephone Encounter (Signed)
Spoke with patients daughter Charlena Cross. I informed her that the forms and payment she brought into the [facility] office were given to Dr.Trotwood's nurse on 8/4 to take to [facility] office. Dr.Progress is not back in the office until next week. Gave daughter number to [facility] office if she had any questions she could call them.

## 2020-10-09 NOTE — Telephone Encounter (Signed)
Pt is returning a call and stated she will continue calling until she speaks with someone in regards to her FMLA paperwork

## 2020-10-12 DIAGNOSIS — Z951 Presence of aortocoronary bypass graft: Secondary | ICD-10-CM | POA: Diagnosis not present

## 2020-10-12 DIAGNOSIS — Z48812 Encounter for surgical aftercare following surgery on the circulatory system: Secondary | ICD-10-CM | POA: Diagnosis not present

## 2020-10-14 DIAGNOSIS — Z48812 Encounter for surgical aftercare following surgery on the circulatory system: Secondary | ICD-10-CM | POA: Diagnosis not present

## 2020-10-14 DIAGNOSIS — Z951 Presence of aortocoronary bypass graft: Secondary | ICD-10-CM | POA: Diagnosis not present

## 2020-10-15 DIAGNOSIS — Z48812 Encounter for surgical aftercare following surgery on the circulatory system: Secondary | ICD-10-CM | POA: Diagnosis not present

## 2020-10-15 DIAGNOSIS — Z951 Presence of aortocoronary bypass graft: Secondary | ICD-10-CM | POA: Diagnosis not present

## 2020-10-15 NOTE — Telephone Encounter (Signed)
Spoke with patient and advised forms faxed  Apologized that Dr Oval Linsey had been out of the office the last 2 weeks and today was her first full day back in office

## 2020-10-15 NOTE — Telephone Encounter (Signed)
Spoke with patient and daughter, forms completed and faxed

## 2020-10-15 NOTE — Telephone Encounter (Signed)
Patient is following up regarding the FMLA paperwork for her daughter. She became very upset on the phone as she feels like she keeps getting the run-around--states she just left the [facility] office and no one would like her the name of an administrator to write a complaint to. May be miscommunication, looks like patient's daughter was informed that Dr. Oval Linsey is no longer at [facility]. Unable to transfer call to LPN at this time, please return call with any updates for patient when able.

## 2020-10-19 DIAGNOSIS — Z951 Presence of aortocoronary bypass graft: Secondary | ICD-10-CM | POA: Diagnosis not present

## 2020-10-19 DIAGNOSIS — Z48812 Encounter for surgical aftercare following surgery on the circulatory system: Secondary | ICD-10-CM | POA: Diagnosis not present

## 2020-10-20 ENCOUNTER — Other Ambulatory Visit: Payer: Self-pay

## 2020-10-20 ENCOUNTER — Ambulatory Visit (INDEPENDENT_AMBULATORY_CARE_PROVIDER_SITE_OTHER): Payer: Medicare HMO | Admitting: Pharmacist

## 2020-10-20 VITALS — BP 138/82 | HR 56 | Resp 17 | Ht 60.25 in | Wt 132.4 lb

## 2020-10-20 DIAGNOSIS — I2583 Coronary atherosclerosis due to lipid rich plaque: Secondary | ICD-10-CM | POA: Diagnosis not present

## 2020-10-20 DIAGNOSIS — E785 Hyperlipidemia, unspecified: Secondary | ICD-10-CM | POA: Diagnosis not present

## 2020-10-20 DIAGNOSIS — I2510 Atherosclerotic heart disease of native coronary artery without angina pectoris: Secondary | ICD-10-CM | POA: Diagnosis not present

## 2020-10-20 NOTE — Patient Instructions (Addendum)
It was nice meeting you today!  We would like your LDL (bad cholesterol) to be less than 70  Please try to work on your diet changes.  Concentrate on vegetables, whole grains, healthy fats, and lean protein  Try to stay away from processed foods and limit alcohol  Increase physical activity to at least 30 minutes a day as directed by rehab  We will recheck your lipid panel in 3 months  Please call with any questions!  Karren Cobble, PharmD, BCACP, [place], [place] [id number] [place], [place] [address] Phone: [phone]; Fax: [phone] 10/20/2020 11:44 AM

## 2020-10-20 NOTE — Progress Notes (Signed)
Patient ID: Virginia Delgado                 DOB: [birth year]                    MRN: [id number]     HPI: Virginia Delgado is a 75 y.o. female patient referred to lipid clinic by Sande Rives.  Patient followed by Dr Oval Linsey. PMH is significant for HTN, CAD, CKG, statin intolerance, history of Hep C, and is s/p CABG x 4.    Patient underwent cardiac catheterization in 03/2020 for further evaluation of exertional chest pain which showed severe 3 vessel CAD including ostial LAD. Echo showed LVEF of 65-70% with normal wall motion and mild AI. CT surgery was consulted and patient underwent CABG x 4 (LIMA to LAD, RIMA to left PDA, sequential radial artery to OM and ramus intermedius) on 04/22/2020.    Patient coronary calcium score 471, 93rd percentile for age and sex  Patient presents today in good spirits. At last visit with Sande Rives, Zetia was added to regimen but patient could not afford copay.  Is intolerant to statins including rosuvastatin '5mg'$ .    Patient LDL 136 on 03/13/19.  She changed to a vegetarian diet and used Bergamot and hawthorn berry and LDL reduced to 108 on 10/09/19.  However then she reports she stopped her diet and went on vacations and LDL increased to 141 in 11/21.    Of note, patient was prescribed and picked up Praluent 2 years ago but was scared to inject it so never used.    Current Medications: Zetia '10mg'$  daily (not taking due to cost) Intolerances: pravastatin, rosuvastatin Risk Factors: CAD, elevated coronary calcium score LDL goal: < 70  Family History: unknown, however father died young  Coronary calcium score of 471. This was 93rd percentile for age and sex matched control.  Past Medical History:  Diagnosis Date   Coronary artery calcification 04/05/2019   Calcium score 93rd percentile 08/2018.   Dermatitis    Hx of hepatitis C    Hypertension     Current Outpatient Medications on File Prior to Visit  Medication Sig Dispense Refill   acetaminophen (TYLENOL)  500 MG tablet Take 2 tablets (1,000 mg total) by mouth every 6 (six) hours as needed. (Patient taking differently: Take 1,000 mg by mouth every 6 (six) hours as needed for mild pain.) 30 tablet 0   amLODipine (NORVASC) 5 MG tablet Take 1 tablet (5 mg total) by mouth daily. 180 tablet 3   aspirin 81 MG tablet Take 81 mg by mouth at bedtime.     Cholecalciferol (VITAMIN D) 50 MCG (2000 UT) tablet Take 4,000 Units by mouth daily.     ezetimibe (ZETIA) 10 MG tablet Take 1 tablet (10 mg total) by mouth daily. 90 tablet 3   losartan (COZAAR) 50 MG tablet Take 50 mg by mouth daily. Take 1 Tablet Daily     metoprolol tartrate (LOPRESSOR) 50 MG tablet Take 50 mg by mouth 2 (two) times daily. Take 1 Tablet Twice Daily     Multiple Minerals-Vitamins (GNP CAL MAG ZINC +D3 PO) Take 1 tablet by mouth in the morning and at bedtime.     nitroGLYCERIN (NITROSTAT) 0.4 MG SL tablet Place 1 tablet (0.4 mg total) under the tongue every 5 (five) minutes as needed for chest pain. 25 tablet 3   NONI, MORINDA CITRIFOLIA, PO Take 600 mg by mouth 2 (two) times daily.     No  current facility-administered medications on file prior to visit.    Allergies  Allergen Reactions   Contrast Media [Iodinated Diagnostic Agents] Swelling    Of face, especially eyes   Gadobutrol Other (See Comments)    Felt extremities go numb briefly as contrast injected.  Had cough (bronchospasm?) briefly after scan.  Resolved on own without treatment.  07/13/15   Gadolinium Derivatives Other (See Comments)    Felt extremities go numb briefly as contrast injected.  Had cough (bronchospasm?) briefly after scan.  Resolved on own without treatment.  07/13/15   Lidocaine Other (See Comments)    Facial swelling after dental procedure   Pravachol [Pravastatin Sodium]    Latex Rash and Other (See Comments)    and irritation    Assessment/Plan:  1. Hyperlipidemia - Patient LDL 119 which is above goal of <70. Could consider more aggressive goal of  <55 due to elevated coronary calcium score.  Patient needs futher lipid lowering therapy but is resistant since she believes she can lower LDL with bergamot and hawthorn berry.  Strongly encouraged patient to consider PCSK9i therapy but she is resistant.  Educated her on mechanism of action, storage, site selection, administration, and possible adverse effects.  Patient voiced understanding but would rather use her supplmenets and change her diet then recheck lipid panel in 3 months.  Reports if still above goal, will then consider Praluent again.  Repeat lipid panel scheduled for November.  Karren Cobble, PharmD, BCACP, [place], [place] [id number], [place], [place] [address] Phone: [phone]; Fax: [phone] 10/20/2020 1:15 PM

## 2020-10-21 DIAGNOSIS — Z48812 Encounter for surgical aftercare following surgery on the circulatory system: Secondary | ICD-10-CM | POA: Diagnosis not present

## 2020-10-21 DIAGNOSIS — Z951 Presence of aortocoronary bypass graft: Secondary | ICD-10-CM | POA: Diagnosis not present

## 2020-10-22 DIAGNOSIS — Z951 Presence of aortocoronary bypass graft: Secondary | ICD-10-CM | POA: Diagnosis not present

## 2020-10-22 DIAGNOSIS — Z48812 Encounter for surgical aftercare following surgery on the circulatory system: Secondary | ICD-10-CM | POA: Diagnosis not present

## 2020-10-28 ENCOUNTER — Ambulatory Visit: Payer: Medicare HMO | Admitting: Internal Medicine

## 2020-10-28 ENCOUNTER — Other Ambulatory Visit: Payer: Self-pay

## 2020-10-28 VITALS — BP 148/84 | HR 64 | Ht 60.03 in | Wt 131.6 lb

## 2020-10-28 DIAGNOSIS — I491 Atrial premature depolarization: Secondary | ICD-10-CM

## 2020-10-28 DIAGNOSIS — R002 Palpitations: Secondary | ICD-10-CM | POA: Diagnosis not present

## 2020-10-28 DIAGNOSIS — I471 Supraventricular tachycardia, unspecified: Secondary | ICD-10-CM

## 2020-10-28 DIAGNOSIS — I472 Ventricular tachycardia: Secondary | ICD-10-CM

## 2020-10-28 DIAGNOSIS — Z48812 Encounter for surgical aftercare following surgery on the circulatory system: Secondary | ICD-10-CM | POA: Diagnosis not present

## 2020-10-28 DIAGNOSIS — Z951 Presence of aortocoronary bypass graft: Secondary | ICD-10-CM | POA: Diagnosis not present

## 2020-10-28 DIAGNOSIS — I4729 Other ventricular tachycardia: Secondary | ICD-10-CM

## 2020-10-28 NOTE — Patient Instructions (Addendum)
Medication Instructions:  Your physician recommends that you continue on your current medications as directed. Please refer to the Current Medication list given to you today.  Labwork: None ordered.  Testing/Procedures: None ordered.  Follow-Up: Your physician wants you to follow-up in: As needed with  Thompson Grayer, MD    Any Other Special Instructions Will Be Listed Below (If Applicable).  If you need a refill on your cardiac medications before your next appointment, please call your pharmacy.

## 2020-10-28 NOTE — Progress Notes (Signed)
Electrophysiology Office Note   Date:  10/28/2020   ID:  Virginia Delgado, DOB 12-Nov-1945, MRN [id number]  PCP:  Glendale Chard, MD  Cardiologist:  Dr Oval Linsey Primary Electrophysiologist: Thompson Grayer, MD    CC: palpitations   History of Present Illness: Virginia Delgado is a 75 y.o. female who presents today for electrophysiology evaluation.   She is referred by Dr Oval Linsey for palpitations. She recently wore an event monitor which revealed frequent PACs and nonsustained atach.  PVCs were also noted. Her metoprolol was increased and she has clinically improved.  She reports resolutions of her palpitations. Currently, her only complaint is with pelvic and lower abdominal discomfort.  She is advised to see her PCP regarding this.   Today, she denies symptoms of palpitations, chest pain, shortness of breath, orthopnea, PND, lower extremity edema, claudication, dizziness, presyncope, syncope, bleeding, or neurologic sequela. The patient is tolerating medications without difficulties and is otherwise without complaint today.    Past Medical History:  Diagnosis Date   Coronary artery calcification 04/05/2019   Calcium score 93rd percentile 08/2018.   Dermatitis    Hx of hepatitis C    Hypertension    Past Surgical History:  Procedure Laterality Date   BUNIONECTOMY Bilateral    CESAREAN SECTION  1976   CESAREAN SECTION  1979   CLIPPING OF ATRIAL APPENDAGE Left 04/22/2020   Procedure: CLIPPING OF  LEFT ATRIAL APPENDAGE USING ATRICURE ATRICLIPFLEX-V 35;  Surgeon: Wonda Olds, MD;  Location: [facility];  Service: Open Heart Surgery;  Laterality: Left;   CORONARY ARTERY BYPASS GRAFT N/A 04/22/2020   Procedure: CORONARY ARTERY BYPASS GRAFTING (CABGX4). USING BILATERAL MAMMARIES;  Surgeon: Wonda Olds, MD;  Location: [facility];  Service: Open Heart Surgery;  Laterality: N/A;  BIMA   LEFT HEART CATH AND CORONARY ANGIOGRAPHY N/A 04/20/2020   Procedure: LEFT HEART CATH AND CORONARY ANGIOGRAPHY;   Surgeon: Nelva Bush, MD;  Location: [facility];  Service: Cardiovascular;  Laterality: N/A;   RADIAL ARTERY HARVEST Right 04/22/2020   Procedure: RIGHT RADIAL ARTERY HARVESTING.;  Surgeon: Wonda Olds, MD;  Location: [facility];  Service: Open Heart Surgery;  Laterality: Right;   TEE WITHOUT CARDIOVERSION N/A 04/22/2020   Procedure: TRANSESOPHAGEAL ECHOCARDIOGRAM (TEE);  Surgeon: Wonda Olds, MD;  Location: [facility];  Service: Open Heart Surgery;  Laterality: N/A;   UMBILICAL HERNIA REPAIR  1981     Current Outpatient Medications  Medication Sig Dispense Refill   acetaminophen (TYLENOL) 500 MG tablet Take 2 tablets (1,000 mg total) by mouth every 6 (six) hours as needed. (Patient taking differently: Take 1,000 mg by mouth every 6 (six) hours as needed for mild pain.) 30 tablet 0   amLODipine (NORVASC) 5 MG tablet Take 1 tablet (5 mg total) by mouth daily. 180 tablet 3   aspirin 81 MG tablet Take 81 mg by mouth at bedtime.     Cholecalciferol (VITAMIN D) 50 MCG (2000 UT) tablet Take 4,000 Units by mouth daily.     diclofenac Sodium (VOLTAREN) 1 % GEL diclofenac 1 % topical gel  APPLY 2-4 GRAMS TO AFFECTED JOINT UP TO 4 TIMES DAILY AS NEEDED     losartan (COZAAR) 50 MG tablet Take 50 mg by mouth daily. Take 1 Tablet Daily     metoprolol tartrate (LOPRESSOR) 50 MG tablet Take 50 mg by mouth 2 (two) times daily. Take 1 Tablet Twice Daily     Multiple Minerals-Vitamins (GNP CAL MAG ZINC +D3 PO) Take 1  tablet by mouth in the morning and at bedtime.     nitroGLYCERIN (NITROSTAT) 0.4 MG SL tablet Place 1 tablet (0.4 mg total) under the tongue every 5 (five) minutes as needed for chest pain. 25 tablet 3   NONI, MORINDA CITRIFOLIA, PO Take 600 mg by mouth 2 (two) times daily.     No current facility-administered medications for this visit.    Allergies:   Contrast media [iodinated diagnostic agents], Gadobutrol, Gadolinium derivatives, Crestor [rosuvastatin], Lidocaine, Lovastatin,  Pravachol [pravastatin sodium], and Latex   Social History:  The patient  reports that she has never smoked. She has never used smokeless tobacco. She reports current alcohol use. She reports that she does not use drugs.   Family History:  The patient's family history includes COPD in her mother; Cancer in her mother and sister; Heart disease in her father; Hypertension in her brother and sister; Ovarian cancer in her maternal grandmother; Rheum arthritis in her paternal grandmother.    ROS:  Please see the history of present illness.   All other systems are personally reviewed and negative.    PHYSICAL EXAM: VS:  BP (!) 148/84   Pulse 64   Ht 5' 0.03" (1.525 m)   Wt 131 lb 9.6 oz (59.7 kg)   SpO2 99%   BMI 25.68 kg/m  , BMI Body mass index is 25.68 kg/m. GEN: Well nourished, well developed, in no acute distress HEENT: normal Neck: no JVD, carotid bruits, or masses Cardiac: RRR; no murmurs, rubs, or gallops,no edema  Respiratory:  clear to auscultation bilaterally, normal work of breathing GI: soft, nontender, nondistended, + BS MS: no deformity or atrophy Skin: warm and dry  Neuro:  Strength and sensation are intact Psych: euthymic mood, full affect  EKG:  EKG is ordered today. The ekg ordered today is personally reviewed and shows sinus rhythm with occasional PVCs   Recent Labs: 06/16/2020: Magnesium 2.3 06/26/2020: TSH 1.090 07/17/2020: ALT 14 07/18/2020: Hemoglobin 12.5; Platelets 205 09/04/2020: BUN 18; Creatinine, Ser 0.91; Potassium 4.0; Sodium 140  personally reviewed   Lipid Panel     Component Value Date/Time   CHOL 206 (H) 09/24/2020 0921   TRIG 49 09/24/2020 0921   HDL 78 09/24/2020 0921   CHOLHDL 2.6 09/24/2020 0921   LDLCALC 119 (H) 09/24/2020 0921   personally reviewed   Wt Readings from Last 3 Encounters:  10/28/20 131 lb 9.6 oz (59.7 kg)  10/20/20 132 lb 6.4 oz (60.1 kg)  09/07/20 122 lb 9.6 oz (55.6 kg)      Other studies personally  reviewed: Additional studies/ records that were reviewed today include: Dr Quintella Reichert notes, cardiology APP notes, prior echo, event monitor  Review of the above records today demonstrates: as above   ASSESSMENT AND PLAN:  1.  Palpitations Appear to be due to PACs, PVCs, and nonsustained atach/ NSVT. She is clinically improved with metoprolol I would not advise AAD therapy at this time. No indication for ablation.  2. HTN Stable No change required today  3. CAD s/p CABG No ischemic symptoms Continue current medical therapy  Risks, benefits and potential toxicities for medications prescribed and/or refilled reviewed with patient today.    Follow-up:  return as needed   Signed, Thompson Grayer, MD  10/28/2020 12:46 PM     [place] [address] [address] [phone] (office) [phone] (fax)

## 2020-10-29 DIAGNOSIS — I2510 Atherosclerotic heart disease of native coronary artery without angina pectoris: Secondary | ICD-10-CM | POA: Diagnosis not present

## 2020-10-29 DIAGNOSIS — Z48812 Encounter for surgical aftercare following surgery on the circulatory system: Secondary | ICD-10-CM | POA: Diagnosis not present

## 2020-10-29 DIAGNOSIS — Z951 Presence of aortocoronary bypass graft: Secondary | ICD-10-CM | POA: Diagnosis not present

## 2020-11-03 ENCOUNTER — Other Ambulatory Visit: Payer: Self-pay | Admitting: General Practice

## 2020-11-04 DIAGNOSIS — Z48812 Encounter for surgical aftercare following surgery on the circulatory system: Secondary | ICD-10-CM | POA: Diagnosis not present

## 2020-11-04 DIAGNOSIS — Z951 Presence of aortocoronary bypass graft: Secondary | ICD-10-CM | POA: Diagnosis not present

## 2020-11-04 DIAGNOSIS — I2510 Atherosclerotic heart disease of native coronary artery without angina pectoris: Secondary | ICD-10-CM | POA: Diagnosis not present

## 2020-11-05 DIAGNOSIS — I2510 Atherosclerotic heart disease of native coronary artery without angina pectoris: Secondary | ICD-10-CM | POA: Diagnosis not present

## 2020-11-05 DIAGNOSIS — Z48812 Encounter for surgical aftercare following surgery on the circulatory system: Secondary | ICD-10-CM | POA: Diagnosis not present

## 2020-11-05 DIAGNOSIS — Z951 Presence of aortocoronary bypass graft: Secondary | ICD-10-CM | POA: Diagnosis not present

## 2020-11-09 DIAGNOSIS — Z951 Presence of aortocoronary bypass graft: Secondary | ICD-10-CM | POA: Diagnosis not present

## 2020-11-09 DIAGNOSIS — I2510 Atherosclerotic heart disease of native coronary artery without angina pectoris: Secondary | ICD-10-CM | POA: Diagnosis not present

## 2020-11-09 DIAGNOSIS — Z48812 Encounter for surgical aftercare following surgery on the circulatory system: Secondary | ICD-10-CM | POA: Diagnosis not present

## 2020-11-11 DIAGNOSIS — Z951 Presence of aortocoronary bypass graft: Secondary | ICD-10-CM | POA: Diagnosis not present

## 2020-11-11 DIAGNOSIS — Z48812 Encounter for surgical aftercare following surgery on the circulatory system: Secondary | ICD-10-CM | POA: Diagnosis not present

## 2020-11-11 DIAGNOSIS — I2510 Atherosclerotic heart disease of native coronary artery without angina pectoris: Secondary | ICD-10-CM | POA: Diagnosis not present

## 2020-11-12 DIAGNOSIS — I2510 Atherosclerotic heart disease of native coronary artery without angina pectoris: Secondary | ICD-10-CM | POA: Diagnosis not present

## 2020-11-12 DIAGNOSIS — Z48812 Encounter for surgical aftercare following surgery on the circulatory system: Secondary | ICD-10-CM | POA: Diagnosis not present

## 2020-11-12 DIAGNOSIS — Z951 Presence of aortocoronary bypass graft: Secondary | ICD-10-CM | POA: Diagnosis not present

## 2020-11-16 DIAGNOSIS — I2510 Atherosclerotic heart disease of native coronary artery without angina pectoris: Secondary | ICD-10-CM | POA: Diagnosis not present

## 2020-11-16 DIAGNOSIS — Z951 Presence of aortocoronary bypass graft: Secondary | ICD-10-CM | POA: Diagnosis not present

## 2020-11-16 DIAGNOSIS — Z48812 Encounter for surgical aftercare following surgery on the circulatory system: Secondary | ICD-10-CM | POA: Diagnosis not present

## 2020-11-17 ENCOUNTER — Other Ambulatory Visit: Payer: Self-pay

## 2020-11-17 ENCOUNTER — Ambulatory Visit (INDEPENDENT_AMBULATORY_CARE_PROVIDER_SITE_OTHER): Payer: Medicare HMO | Admitting: Internal Medicine

## 2020-11-17 ENCOUNTER — Encounter: Payer: Self-pay | Admitting: Internal Medicine

## 2020-11-17 VITALS — BP 132/78 | HR 51 | Temp 99.0°F | Ht 60.3 in | Wt 129.8 lb

## 2020-11-17 DIAGNOSIS — R69 Illness, unspecified: Secondary | ICD-10-CM | POA: Diagnosis not present

## 2020-11-17 DIAGNOSIS — H539 Unspecified visual disturbance: Secondary | ICD-10-CM | POA: Diagnosis not present

## 2020-11-17 DIAGNOSIS — N182 Chronic kidney disease, stage 2 (mild): Secondary | ICD-10-CM | POA: Diagnosis not present

## 2020-11-17 DIAGNOSIS — R202 Paresthesia of skin: Secondary | ICD-10-CM | POA: Diagnosis not present

## 2020-11-17 DIAGNOSIS — B182 Chronic viral hepatitis C: Secondary | ICD-10-CM

## 2020-11-17 DIAGNOSIS — I1310 Hypertensive heart and chronic kidney disease without heart failure, with stage 1 through stage 4 chronic kidney disease, or unspecified chronic kidney disease: Secondary | ICD-10-CM | POA: Diagnosis not present

## 2020-11-17 DIAGNOSIS — I2510 Atherosclerotic heart disease of native coronary artery without angina pectoris: Secondary | ICD-10-CM | POA: Diagnosis not present

## 2020-11-17 DIAGNOSIS — E785 Hyperlipidemia, unspecified: Secondary | ICD-10-CM

## 2020-11-17 DIAGNOSIS — I2583 Coronary atherosclerosis due to lipid rich plaque: Secondary | ICD-10-CM | POA: Diagnosis not present

## 2020-11-17 DIAGNOSIS — I10 Essential (primary) hypertension: Secondary | ICD-10-CM

## 2020-11-17 DIAGNOSIS — Z0000 Encounter for general adult medical examination without abnormal findings: Secondary | ICD-10-CM

## 2020-11-17 DIAGNOSIS — Z0001 Encounter for general adult medical examination with abnormal findings: Secondary | ICD-10-CM | POA: Diagnosis not present

## 2020-11-17 DIAGNOSIS — R7309 Other abnormal glucose: Secondary | ICD-10-CM | POA: Diagnosis not present

## 2020-11-17 LAB — POCT URINALYSIS DIPSTICK
Bilirubin, UA: NEGATIVE
Glucose, UA: NEGATIVE
Ketones, UA: NEGATIVE
Leukocytes, UA: NEGATIVE
Nitrite, UA: NEGATIVE
Protein, UA: NEGATIVE
Spec Grav, UA: 1.02 (ref 1.010–1.025)
Urobilinogen, UA: 0.2 E.U./dL
pH, UA: 5.5 (ref 5.0–8.0)

## 2020-11-17 LAB — POCT UA - MICROALBUMIN
Albumin/Creatinine Ratio, Urine, POC: <30
Creatinine, POC: 300 mg/dL
Microalbumin Ur, POC: 30 mg/L

## 2020-11-17 NOTE — Patient Instructions (Signed)
Health Maintenance, Female Adopting a healthy lifestyle and getting preventive care are important in promoting health and wellness. Ask your health care provider about: The right schedule for you to have regular tests and exams. Things you can do on your own to prevent diseases and keep yourself healthy. What should I know about diet, weight, and exercise? Eat a healthy diet  Eat a diet that includes plenty of vegetables, fruits, low-fat dairy products, and lean protein. Do not eat a lot of foods that are high in solid fats, added sugars, or sodium. Maintain a healthy weight Body mass index (BMI) is used to identify weight problems. It estimates body fat based on height and weight. Your health care provider can help determine your BMI and help you achieve or maintain a healthy weight. Get regular exercise Get regular exercise. This is one of the most important things you can do for your health. Most adults should: Exercise for at least 150 minutes each week. The exercise should increase your heart rate and make you sweat (moderate-intensity exercise). Do strengthening exercises at least twice a week. This is in addition to the moderate-intensity exercise. Spend less time sitting. Even light physical activity can be beneficial. Watch cholesterol and blood lipids Have your blood tested for lipids and cholesterol at 73 years of age, then have this test every 5 years. Have your cholesterol levels checked more often if: Your lipid or cholesterol levels are high. You are older than 73 years of age. You are at high risk for heart disease. What should I know about cancer screening? Depending on your health history and family history, you may need to have cancer screening at various ages. This may include screening for: Breast cancer. Cervical cancer. Colorectal cancer. Skin cancer. Lung cancer. What should I know about heart disease, diabetes, and high blood pressure? Blood pressure and heart  disease High blood pressure causes heart disease and increases the risk of stroke. This is more likely to develop in people who have high blood pressure readings, are of African descent, or are overweight. Have your blood pressure checked: Every 3-5 years if you are 18-39 years of age. Every year if you are 40 years old or older. Diabetes Have regular diabetes screenings. This checks your fasting blood sugar level. Have the screening done: Once every three years after age 40 if you are at a normal weight and have a low risk for diabetes. More often and at a younger age if you are overweight or have a high risk for diabetes. What should I know about preventing infection? Hepatitis B If you have a higher risk for hepatitis B, you should be screened for this virus. Talk with your health care provider to find out if you are at risk for hepatitis B infection. Hepatitis C Testing is recommended for: Everyone born from 1945 through 1965. Anyone with known risk factors for hepatitis C. Sexually transmitted infections (STIs) Get screened for STIs, including gonorrhea and chlamydia, if: You are sexually active and are younger than 73 years of age. You are older than 73 years of age and your health care provider tells you that you are at risk for this type of infection. Your sexual activity has changed since you were last screened, and you are at increased risk for chlamydia or gonorrhea. Ask your health care provider if you are at risk. Ask your health care provider about whether you are at high risk for HIV. Your health care provider may recommend a prescription medicine   to help prevent HIV infection. If you choose to take medicine to prevent HIV, you should first get tested for HIV. You should then be tested every 3 months for as long as you are taking the medicine. Pregnancy If you are about to stop having your period (premenopausal) and you may become pregnant, seek counseling before you get  pregnant. Take 400 to 800 micrograms (mcg) of folic acid every day if you become pregnant. Ask for birth control (contraception) if you want to prevent pregnancy. Osteoporosis and menopause Osteoporosis is a disease in which the bones lose minerals and strength with aging. This can result in bone fractures. If you are 65 years old or older, or if you are at risk for osteoporosis and fractures, ask your health care provider if you should: Be screened for bone loss. Take a calcium or vitamin D supplement to lower your risk of fractures. Be given hormone replacement therapy (HRT) to treat symptoms of menopause. Follow these instructions at home: Lifestyle Do not use any products that contain nicotine or tobacco, such as cigarettes, e-cigarettes, and chewing tobacco. If you need help quitting, ask your health care provider. Do not use street drugs. Do not share needles. Ask your health care provider for help if you need support or information about quitting drugs. Alcohol use Do not drink alcohol if: Your health care provider tells you not to drink. You are pregnant, may be pregnant, or are planning to become pregnant. If you drink alcohol: Limit how much you use to 0-1 drink a day. Limit intake if you are breastfeeding. Be aware of how much alcohol is in your drink. In the [place] one drink equals one 12 oz bottle of beer (355 mL), one 5 oz glass of wine (148 mL), or one 1 oz glass of hard liquor (44 mL). General instructions Schedule regular health, dental, and eye exams. Stay current with your vaccines. Tell your health care provider if: You often feel depressed. You have ever been abused or do not feel safe at home. Summary Adopting a healthy lifestyle and getting preventive care are important in promoting health and wellness. Follow your health care provider's instructions about healthy diet, exercising, and getting tested or screened for diseases. Follow your health care provider's  instructions on monitoring your cholesterol and blood pressure. This information is not intended to replace advice given to you by your health care provider. Make sure you discuss any questions you have with your health care provider. Document Revised: 04/24/2020 Document Reviewed: 02/07/2018 Elsevier Patient Education  2022 [employer]  

## 2020-11-17 NOTE — Progress Notes (Signed)
I,Katawbba Wiggins,acting as a Education administrator for Maximino Greenland, MD.,have documented all relevant documentation on the behalf of Maximino Greenland, MD,as directed by  Maximino Greenland, MD while in the presence of Maximino Greenland, MD.  This visit occurred during the SARS-CoV-2 public health emergency.  Safety protocols were in place, including screening questions prior to the visit, additional usage of staff PPE, and extensive cleaning of exam room while observing appropriate contact time as indicated for disinfecting solutions.  Subjective:     Patient ID: Virginia Delgado , female    DOB: [birth year] , 75 y.o.   MRN: [id number]   Chief Complaint  Patient presents with   Annual Exam   Hypertension    HPI  She is here today for a full physical examination.   She is also followed by GYN. She reports compliance with meds.   PMH is significant for HTN, CAD, CKG, statin intolerance, history of Hep C, and is s/p CABG x 4.     Patient underwent cardiac catheterization in 03/2020 for further evaluation of exertional chest pain which showed severe 3 vessel CAD including ostial LAD. Echo showed LVEF of 65-70% with normal wall motion and mild AI. CT surgery was consulted and patient underwent CABG x 4 (LIMA to LAD, RIMA to left PDA, sequential radial artery to OM and ramus intermedius) on 04/22/2020.     Patient coronary calcium score 471, 93rd percentile for age and sex     Hypertension This is a chronic problem. The current episode started more than 1 year ago. The problem has been gradually improving since onset. The problem is controlled. Associated symptoms include neck pain. Pertinent negatives include no blurred vision, chest pain, palpitations or shortness of breath. Risk factors for coronary artery disease include dyslipidemia and post-menopausal state. Past treatments include angiotensin blockers and calcium channel blockers. The current treatment provides moderate improvement. Hypertensive end-organ damage  includes kidney disease.    Past Medical History:  Diagnosis Date   Coronary artery calcification 04/05/2019   Calcium score 93rd percentile 08/2018.   Dermatitis    Hx of hepatitis C    Hypertension      Family History  Problem Relation Age of Onset   Cancer Mother        breast   COPD Mother    Heart disease Father    Cancer Sister        sarcoma   Hypertension Brother    Ovarian cancer Maternal Grandmother    Rheum arthritis Paternal Grandmother    Hypertension Sister      Current Outpatient Medications:    acetaminophen (TYLENOL) 500 MG tablet, Take 2 tablets (1,000 mg total) by mouth every 6 (six) hours as needed. (Patient taking differently: Take 1,000 mg by mouth every 6 (six) hours as needed for mild pain.), Disp: 30 tablet, Rfl: 0   amLODipine (NORVASC) 5 MG tablet, Take 1 tablet (5 mg total) by mouth daily., Disp: 180 tablet, Rfl: 3   aspirin 81 MG tablet, Take 81 mg by mouth at bedtime., Disp: , Rfl:    Cholecalciferol (VITAMIN D) 50 MCG (2000 UT) tablet, Take 4,000 Units by mouth daily., Disp: , Rfl:    diclofenac Sodium (VOLTAREN) 1 % GEL, diclofenac 1 % topical gel  APPLY 2-4 GRAMS TO AFFECTED JOINT UP TO 4 TIMES DAILY AS NEEDED, Disp: , Rfl:    metoprolol tartrate (LOPRESSOR) 50 MG tablet, Take 50 mg by mouth 2 (two) times daily. Take 1 Tablet Twice  Daily, Disp: , Rfl:    Multiple Minerals-Vitamins (GNP CAL MAG ZINC +D3 PO), Take 1 tablet by mouth in the morning and at bedtime., Disp: , Rfl:    nitroGLYCERIN (NITROSTAT) 0.4 MG SL tablet, Place 1 tablet (0.4 mg total) under the tongue every 5 (five) minutes as needed for chest pain., Disp: 25 tablet, Rfl: 3   NONI, MORINDA CITRIFOLIA, PO, Take 600 mg by mouth 2 (two) times daily., Disp: , Rfl:    losartan (COZAAR) 50 MG tablet, Take 1 tablet (50 mg total) by mouth daily., Disp: 90 tablet, Rfl: 3   metoprolol tartrate (LOPRESSOR) 50 MG tablet, Take 1 tablet (50 mg total) by mouth 2 (two) times daily., Disp: 180 tablet,  Rfl: 1   Allergies  Allergen Reactions   Contrast Media [Iodinated Diagnostic Agents] Swelling    Of face, especially eyes   Gadobutrol Other (See Comments)    Felt extremities go numb briefly as contrast injected.  Had cough (bronchospasm?) briefly after scan.  Resolved on own without treatment.  07/13/15   Gadolinium Derivatives Other (See Comments)    Felt extremities go numb briefly as contrast injected.  Had cough (bronchospasm?) briefly after scan.  Resolved on own without treatment.  07/13/15   Crestor [Rosuvastatin]     myalgias   Lidocaine Other (See Comments)    Facial swelling after dental procedure   Lovastatin     myalgias   Pravachol [Pravastatin Sodium]    Latex Rash and Other (See Comments)    and irritation      The patient states she uses post menopausal status for birth control. Last LMP was No LMP recorded. Patient is postmenopausal.. Negative for Dysmenorrhea. Negative for: breast discharge, breast lump(s), breast pain and breast self exam. Associated symptoms include abnormal vaginal bleeding. Pertinent negatives include abnormal bleeding (hematology), anxiety, decreased libido, depression, difficulty falling sleep, dyspareunia, history of infertility, nocturia, sexual dysfunction, sleep disturbances, urinary incontinence, urinary urgency, vaginal discharge and vaginal itching. Diet regular.The patient states her exercise level is    . The patient's tobacco use is:  Social History   Tobacco Use  Smoking Status Never  Smokeless Tobacco Never  . She has been exposed to passive smoke. The patient's alcohol use is:  Social History   Substance and Sexual Activity  Alcohol Use Yes   Comment: social   Review of Systems  Constitutional: Negative.   HENT: Negative.    Eyes: Negative.  Negative for blurred vision.  Respiratory: Negative.  Negative for shortness of breath.   Cardiovascular: Negative.  Negative for chest pain and palpitations.  Gastrointestinal:  Negative.   Endocrine: Negative.   Genitourinary: Negative.   Musculoskeletal:  Positive for neck pain.       Breast  Skin: Negative.   Allergic/Immunologic: Negative.   Neurological:  Positive for numbness (breast).       She c/o numbness in her chest.   Hematological: Negative.   Psychiatric/Behavioral: Negative.      Today's Vitals   11/17/20 1011  BP: 132/78  Pulse: (!) 51  Temp: 99 F (37.2 C)  TempSrc: Oral  Weight: 129 lb 12.8 oz (58.9 kg)  Height: 5' 0.3" (1.532 m)  PainSc: 3   PainLoc: Breast   Body mass index is 25.1 kg/m.  Wt Readings from Last 3 Encounters:  11/17/20 129 lb 12.8 oz (58.9 kg)  10/28/20 131 lb 9.6 oz (59.7 kg)  10/20/20 132 lb 6.4 oz (60.1 kg)    BP Readings from  Last 3 Encounters:  11/17/20 132/78  10/28/20 (!) 148/84  10/20/20 138/82    Objective:  Physical Exam Vitals and nursing note reviewed.  Constitutional:      Appearance: Normal appearance.  HENT:     Head: Normocephalic and atraumatic.     Right Ear: Tympanic membrane, ear canal and external ear normal.     Left Ear: Tympanic membrane, ear canal and external ear normal.     Nose:     Comments: Masked     Mouth/Throat:     Comments: Masked  Eyes:     Extraocular Movements: Extraocular movements intact.     Conjunctiva/sclera: Conjunctivae normal.     Pupils: Pupils are equal, round, and reactive to light.  Cardiovascular:     Rate and Rhythm: Normal rate and regular rhythm.     Pulses: Normal pulses.     Heart sounds: Normal heart sounds.  Pulmonary:     Effort: Pulmonary effort is normal.     Breath sounds: Normal breath sounds.  Chest:  Breasts:    Tanner Score is 5.     Right: Normal.     Left: Normal.     Comments: Healed sternal surgical scar Abdominal:     General: Abdomen is flat. Bowel sounds are normal.     Palpations: Abdomen is soft.  Genitourinary:    Comments: deferred Musculoskeletal:        General: Normal range of motion.     Cervical back:  Normal range of motion and neck supple.  Skin:    General: Skin is warm and dry.  Neurological:     General: No focal deficit present.     Mental Status: She is alert and oriented to person, place, and time.  Psychiatric:        Mood and Affect: Mood normal.        Behavior: Behavior normal.        Assessment And Plan:     1. Routine general medical examination at a health care facility Comments: A full exam was performed. Importance of monthly self breast exams was discussed with the patient. PATIENT IS ADVISED TO GET 30-45 MINUTES REGULAR EXERCISE NO LESS THAN FOUR TO FIVE DAYS PER WEEK - BOTH WEIGHTBEARING EXERCISES AND AEROBIC ARE RECOMMENDED.  PATIENT IS ADVISED TO FOLLOW A HEALTHY DIET WITH AT LEAST SIX FRUITS/VEGGIES PER DAY, DECREASE INTAKE OF RED MEAT, AND TO INCREASE FISH INTAKE TO TWO DAYS PER WEEK.  MEATS/FISH SHOULD NOT BE FRIED, BAKED OR BROILED IS PREFERABLE.  IT IS ALSO IMPORTANT TO CUT BACK ON YOUR SUGAR INTAKE. PLEASE AVOID ANYTHING WITH ADDED SUGAR, CORN SYRUP OR OTHER SWEETENERS. IF YOU MUST USE A SWEETENER, YOU CAN TRY STEVIA. IT IS ALSO IMPORTANT TO AVOID ARTIFICIALLY SWEETENERS AND DIET BEVERAGES. LASTLY, I SUGGEST WEARING SPF 50 SUNSCREEN ON EXPOSED PARTS AND ESPECIALLY WHEN IN THE DIRECT SUNLIGHT FOR AN EXTENDED PERIOD OF TIME.  PLEASE AVOID FAST FOOD RESTAURANTS AND INCREASE YOUR WATER INTAKE.  2. Hypertensive heart and renal disease with renal failure, stage 1 through stage 4 or unspecified chronic kidney disease, without heart failure Comments: Chronic, fair control. Goal BP is less than 130/80. Encouraged to follow low sodium diet. Previous EKG reviewed, not performed today. She agrees to [facility] referral. - POCT Urinalysis Dipstick (81002) - POCT UA - Microalbumin - CBC - AMB [facility]erral to [facility] - Liver Profile - Insulin, random(561)  3. Coronary artery disease due to lipid rich plaque Comments: Chronic, s/p  CABG. Encouraged to follow heart  healthy diet and live an active lifestyle. She will c/w cardiac rehab. Pharmacy/Cardiology input appreciated.  - AMB Referral to [place]  4. Chronic renal disease, stage II Comments: Chronic, encouraged to keep BP controlled and to stay well hydrated to decrease risk of progression of CKD.   5. Hyperlipidemia LDL goal <70 Comments: Chronic, statin intolerant. She does not wish to take rx meds at this time. She clearly needs futher lipid lowering therapy , but chooses she can lower LDL with bergamot and hawthorn berry (she states she was successful with this in the past).  Strongly encouraged patient to consider PCSK9i therapy but she is also resistant to this, despite recent diagnosis of CAD.  6. Chronic hepatitis C without hepatic coma ([place]) Comments: She is s/p treatment w/ [facility].   7. Visual disturbance Comments: She is encouraged to f/u with her ophthalmologist for further evaluation.  - Hemoglobin A1c  8. Paresthesia Comments: Occurs in breast area. Likely related to postsurgical complications. She may benefit from B-complex supplementation.   Patient was given opportunity to ask questions. Patient verbalized understanding of the plan and was able to repeat key elements of the plan. All questions were answered to their satisfaction.   I, Maximino Greenland, MD, have reviewed all documentation for this visit. The documentation on 11/17/20 for the exam, diagnosis, procedures, and orders are all accurate and complete.   THE PATIENT IS ENCOURAGED TO PRACTICE SOCIAL DISTANCING DUE TO THE COVID-19 PANDEMIC.

## 2020-11-18 DIAGNOSIS — Z48812 Encounter for surgical aftercare following surgery on the circulatory system: Secondary | ICD-10-CM | POA: Diagnosis not present

## 2020-11-18 DIAGNOSIS — Z951 Presence of aortocoronary bypass graft: Secondary | ICD-10-CM | POA: Diagnosis not present

## 2020-11-18 DIAGNOSIS — I2510 Atherosclerotic heart disease of native coronary artery without angina pectoris: Secondary | ICD-10-CM | POA: Diagnosis not present

## 2020-11-18 LAB — HEMOGLOBIN A1C
Est. average glucose Bld gHb Est-mCnc: 114 mg/dL
Hgb A1c MFr Bld: 5.6 % (ref 4.8–5.6)

## 2020-11-18 LAB — CBC
Hematocrit: 39.7 % (ref 34.0–46.6)
Hemoglobin: 13.1 g/dL (ref 11.1–15.9)
MCH: 28.9 pg (ref 26.6–33.0)
MCHC: 33 g/dL (ref 31.5–35.7)
MCV: 88 fL (ref 79–97)
Platelets: 183 10*3/uL (ref 150–450)
RBC: 4.53 x10E6/uL (ref 3.77–5.28)
RDW: 12.8 % (ref 11.7–15.4)
WBC: 4.5 10*3/uL (ref 3.4–10.8)

## 2020-11-18 LAB — HEPATIC FUNCTION PANEL
ALT: 9 IU/L (ref 0–32)
AST: 17 IU/L (ref 0–40)
Albumin: 4.6 g/dL (ref 3.7–4.7)
Alkaline Phosphatase: 81 IU/L (ref 44–121)
Bilirubin Total: 1.7 mg/dL — ABNORMAL HIGH (ref 0.0–1.2)
Bilirubin, Direct: 0.3 mg/dL (ref 0.00–0.40)
Total Protein: 6.9 g/dL (ref 6.0–8.5)

## 2020-11-18 LAB — INSULIN, RANDOM: INSULIN: 7.7 u[IU]/mL (ref 2.6–24.9)

## 2020-11-19 DIAGNOSIS — Z951 Presence of aortocoronary bypass graft: Secondary | ICD-10-CM | POA: Diagnosis not present

## 2020-11-19 DIAGNOSIS — I2510 Atherosclerotic heart disease of native coronary artery without angina pectoris: Secondary | ICD-10-CM | POA: Diagnosis not present

## 2020-11-19 DIAGNOSIS — Z48812 Encounter for surgical aftercare following surgery on the circulatory system: Secondary | ICD-10-CM | POA: Diagnosis not present

## 2020-11-23 DIAGNOSIS — I2510 Atherosclerotic heart disease of native coronary artery without angina pectoris: Secondary | ICD-10-CM | POA: Diagnosis not present

## 2020-11-23 DIAGNOSIS — Z951 Presence of aortocoronary bypass graft: Secondary | ICD-10-CM | POA: Diagnosis not present

## 2020-11-23 DIAGNOSIS — Z48812 Encounter for surgical aftercare following surgery on the circulatory system: Secondary | ICD-10-CM | POA: Diagnosis not present

## 2020-11-25 DIAGNOSIS — Z48812 Encounter for surgical aftercare following surgery on the circulatory system: Secondary | ICD-10-CM | POA: Diagnosis not present

## 2020-11-25 DIAGNOSIS — Z951 Presence of aortocoronary bypass graft: Secondary | ICD-10-CM | POA: Diagnosis not present

## 2020-11-25 DIAGNOSIS — I2510 Atherosclerotic heart disease of native coronary artery without angina pectoris: Secondary | ICD-10-CM | POA: Diagnosis not present

## 2020-11-26 ENCOUNTER — Telehealth: Payer: Self-pay | Admitting: Internal Medicine

## 2020-11-26 ENCOUNTER — Telehealth: Payer: Self-pay | Admitting: *Deleted

## 2020-11-26 DIAGNOSIS — Z951 Presence of aortocoronary bypass graft: Secondary | ICD-10-CM | POA: Diagnosis not present

## 2020-11-26 DIAGNOSIS — I2510 Atherosclerotic heart disease of native coronary artery without angina pectoris: Secondary | ICD-10-CM | POA: Diagnosis not present

## 2020-11-26 DIAGNOSIS — Z48812 Encounter for surgical aftercare following surgery on the circulatory system: Secondary | ICD-10-CM | POA: Diagnosis not present

## 2020-11-26 NOTE — Chronic Care Management (AMB) (Signed)
  Chronic Care Management   Note  11/26/2020 Name: Virginia Delgado MRN: [id number] DOB: Mar 31, 1945  Virginia Delgado is a 75 y.o. year old female who is a primary care patient of Glendale Chard, MD. I reached out to Deardra Jillson by phone today in response to a referral sent by Virginia Delgado's PCP.  Virginia Delgado was given information about Chronic Care Management services today including:  CCM service includes personalized support from designated clinical staff supervised by her physician, including individualized plan of care and coordination with other care providers 24/7 contact phone numbers for assistance for urgent and routine care needs. Service will only be billed when office clinical staff spend 20 minutes or more in a month to coordinate care. Only one practitioner may furnish and bill the service in a calendar month. The patient may stop CCM services at any time (effective at the end of the month) by phone call to the office staff. The patient is responsible for co-pay (up to 20% after annual deductible is met) if co-pay is required by the individual health plan.   Patient agreed to services and verbal consent obtained.   Follow up plan: Telephone appointment with care management team member scheduled for:12/01/20  [place] [facility]  Direct Dial: [phone]

## 2020-11-26 NOTE — Telephone Encounter (Signed)
Left message for patient to call back and schedule Medicare Annual Wellness Visit (AWV) either virtually or in office.  Left both my jabber number [phone] and office number    Last AWV 01/22/20  please schedule at anytime with [facility]    This should be a 45 minute visit.   Holland Falling can do AWV calendar year

## 2020-11-28 ENCOUNTER — Other Ambulatory Visit: Payer: Self-pay | Admitting: Cardiovascular Disease

## 2020-11-30 DIAGNOSIS — Z48812 Encounter for surgical aftercare following surgery on the circulatory system: Secondary | ICD-10-CM | POA: Diagnosis not present

## 2020-11-30 DIAGNOSIS — Z951 Presence of aortocoronary bypass graft: Secondary | ICD-10-CM | POA: Diagnosis not present

## 2020-12-01 ENCOUNTER — Ambulatory Visit: Payer: Medicare HMO

## 2020-12-01 ENCOUNTER — Other Ambulatory Visit: Payer: Self-pay

## 2020-12-01 ENCOUNTER — Telehealth: Payer: Medicare HMO

## 2020-12-01 ENCOUNTER — Ambulatory Visit (INDEPENDENT_AMBULATORY_CARE_PROVIDER_SITE_OTHER): Payer: Medicare HMO

## 2020-12-01 DIAGNOSIS — E785 Hyperlipidemia, unspecified: Secondary | ICD-10-CM

## 2020-12-01 DIAGNOSIS — I2510 Atherosclerotic heart disease of native coronary artery without angina pectoris: Secondary | ICD-10-CM

## 2020-12-01 DIAGNOSIS — I2583 Coronary atherosclerosis due to lipid rich plaque: Secondary | ICD-10-CM

## 2020-12-01 DIAGNOSIS — I1310 Hypertensive heart and chronic kidney disease without heart failure, with stage 1 through stage 4 chronic kidney disease, or unspecified chronic kidney disease: Secondary | ICD-10-CM

## 2020-12-01 NOTE — Chronic Care Management (AMB) (Signed)
Chronic Care Management    Social Work Note  12/01/2020 Name: Virginia Delgado MRN: [id number] DOB: 01-23-1946  Virginia Delgado is a 75 y.o. year old female who is a primary care patient of Glendale Chard, MD. The CCM team was consulted to assist the patient with chronic disease management and/or care coordination needs related to:  HLD and CAD .   Engaged with patient by telephone for initial visit in response to provider referral for social work chronic care management and care coordination services.   Consent to Services:  The patient was given the following information about Chronic Care Management services today, agreed to services, and gave verbal consent: 1. CCM service includes personalized support from designated clinical staff supervised by the primary care provider, including individualized plan of care and coordination with other care providers 2. 24/7 contact phone numbers for assistance for urgent and routine care needs. 3. Service will only be billed when office clinical staff spend 20 minutes or more in a month to coordinate care. 4. Only one practitioner may furnish and bill the service in a calendar month. 5.The patient may stop CCM services at any time (effective at the end of the month) by phone call to the office staff. 6. The patient will be responsible for cost sharing (co-pay) of up to 20% of the service fee (after annual deductible is met). Patient agreed to services and consent obtained.  Patient agreed to services and consent obtained.   Assessment: Review of patient past medical history, allergies, medications, and health status, including review of relevant consultants reports was performed today as part of a comprehensive evaluation and provision of chronic care management and care coordination services.     SDOH (Social Determinants of Health) assessments and interventions performed:  SDOH Interventions    Flowsheet Row Most Recent Value  SDOH Interventions   Food  Insecurity Interventions Patient Refused  Housing Interventions Intervention Not Indicated  Transportation Interventions Intervention Not Indicated        Advanced Directives Status: Not addressed in this encounter.  CCM Care Plan  Allergies  Allergen Reactions   Contrast Media [Iodinated Diagnostic Agents] Swelling    Of face, especially eyes   Gadobutrol Other (See Comments)    Felt extremities go numb briefly as contrast injected.  Had cough (bronchospasm?) briefly after scan.  Resolved on own without treatment.  07/13/15   Gadolinium Derivatives Other (See Comments)    Felt extremities go numb briefly as contrast injected.  Had cough (bronchospasm?) briefly after scan.  Resolved on own without treatment.  07/13/15   Crestor [Rosuvastatin]     myalgias   Lidocaine Other (See Comments)    Facial swelling after dental procedure   Lovastatin     myalgias   Pravachol [Pravastatin Sodium]    Latex Rash and Other (See Comments)    and irritation    Outpatient Encounter Medications as of 12/01/2020  Medication Sig   acetaminophen (TYLENOL) 500 MG tablet Take 2 tablets (1,000 mg total) by mouth every 6 (six) hours as needed. (Patient taking differently: Take 1,000 mg by mouth every 6 (six) hours as needed for mild pain.)   amLODipine (NORVASC) 5 MG tablet Take 1 tablet (5 mg total) by mouth daily.   aspirin 81 MG tablet Take 81 mg by mouth at bedtime.   Cholecalciferol (VITAMIN D) 50 MCG (2000 UT) tablet Take 4,000 Units by mouth daily.   diclofenac Sodium (VOLTAREN) 1 % GEL diclofenac 1 % topical gel  APPLY 2-4  GRAMS TO AFFECTED JOINT UP TO 4 TIMES DAILY AS NEEDED   losartan (COZAAR) 50 MG tablet Take 50 mg by mouth daily. Take 1 Tablet Daily   metoprolol tartrate (LOPRESSOR) 50 MG tablet Take 50 mg by mouth 2 (two) times daily. Take 1 Tablet Twice Daily   Multiple Minerals-Vitamins (GNP CAL MAG ZINC +D3 PO) Take 1 tablet by mouth in the morning and at bedtime.   nitroGLYCERIN  (NITROSTAT) 0.4 MG SL tablet Place 1 tablet (0.4 mg total) under the tongue every 5 (five) minutes as needed for chest pain.   NONI, MORINDA CITRIFOLIA, PO Take 600 mg by mouth 2 (two) times daily.   No facility-administered encounter medications on file as of 12/01/2020.    Patient Active Problem List   Diagnosis Date Noted   Acute kidney injury superimposed on chronic kidney disease ([place]) 07/18/2020   Hypertensive urgency 07/17/2020   S/P CABG x 4 04/26/2020   Polyarthritis with positive rheumatoid factor ([place]) 02/10/2020   Statin intolerance 11/26/2019   CAD (coronary artery disease) 04/05/2019   Hypertensive nephropathy 03/12/2018   Chronic renal disease, stage II 03/12/2018   Estrogen deficiency 03/12/2018   Hyperlipidemia LDL goal <70 10/20/2017   Menopause 07/19/2011   Essential hypertension    Hepatitis C     Conditions to be addressed/monitored: CAD and HLD; ADL IADL limitations  Care Plan : Social Work [place]  Updates made by Daneen Schick since 12/01/2020 12:00 AM     Problem: Mobility and Independence      Goal: Mobility and Independence Optimized   Start Date: 12/01/2020  Priority: Medium  Note:   Current Barriers:  Chronic disease management support and education needs related to CAD and HLD  ADL IADL limitations - patient underwent quadruple bypass earlier in the year and is still regaining functional ability  Midwife):  patient will work with SW to identify and address any acute and/or chronic care coordination needs related to the self health management of CAD and HLD  Patient will work with Consulting civil engineer to develop an individualized plan of care to address disease management  SW Interventions:  Inter-disciplinary care team collaboration (see longitudinal plan of care) Collaboration with Glendale Chard, MD regarding development and update of comprehensive plan of care as evidenced by provider attestation and  co-signature Successful outbound call placed to the patient to conduct a SW screen SDoH screening performed - patient indicates she has struggled to afford food due to limited income since quadruple bypass. Patient declines resources at this time Encouraged the patient to contact SW as needed for resource information Discussed the patient has been participating in cardiac rehab and this Thursday will be her last day with that program Patient reports she has improved quite a bit since participating in cardiac rehab and is now able to drive herself independently Determined the patient continues to have difficulty with cooking and getting dressed (specifically under garments) Patient reports she also continues to walk with a cane due to gait instability  Discussed the patient is interested in working with PT and/or OT if her primary provider feels this service is needed Advised the patient SW would collaborate with Dr Baird Cancer to discuss if orders for PT/OT were needed at this time Patient reports her children have been very supportive of her in the past several months and she plans to move to [place] to be closer to them within the next 2 months - patient will [facility] care [facility]  During todays call patient reported ongoing pain in her neck - patient reports she has not spoken with her provider about this pain as it has not been consistent. However, in the past 3-4 days patient has noticed the pain more often Patient reports the pain is behind her head, down her neck, and around her left shoulder. Patient states she is using a topical analgesic to treat the pain Patient denies chest pain during these episodes Advised the patient SW would collaborate with Dr. Baird Cancer to report complaints of patients pain and request a member of the primary providers office contact her with follow up needs Discussed follow up with SW while patient engages with  RN Case Manager  to address care management  needs Scheduled appointment with RN Care Manager for 12/08/20 Collaboration with RN Care Manager to advise of patients enrollment into the program and discuss plan of care Collaboration with Dr Baird Cancer to communicate patient complaint of pain as well as patient interest in PT/OT  Patient Goals/Self-Care Activities patient will:   -  Follow up with her primary care provider regarding neck pain -Participate in telephone assessment with [place] on 10/11 -Contact SW as needed prior to next scheduled call  Follow Up Plan:  SW will follow up with the patient over the next 45 days       Follow Up Plan: SW will follow up with patient by phone over the next 45 days      Daneen Schick, BSW, CDP Social Worker, Certified Dementia Practitioner [place] [facility] [place] [facility] [phone]

## 2020-12-01 NOTE — Chronic Care Management (AMB) (Signed)
Chronic Care Management   CCM RN Visit Note  12/01/2020 Name: Virginia Delgado MRN: [id number] DOB: 06-10-1945  Subjective: Virginia Delgado is a 75 y.o. year old female who is a primary care patient of Glendale Chard, MD. The care management team was consulted for assistance with disease management and care coordination needs.    Collaboration with Daneen Schick BSW  for  Case Collaboration  in response to provider referral for case management and/or care coordination services.   Consent to Services:  The patient was given the following information about Chronic Care Management services today, agreed to services, and gave verbal consent: 1. CCM service includes personalized support from designated clinical staff supervised by the primary care provider, including individualized plan of care and coordination with other care providers 2. 24/7 contact phone numbers for assistance for urgent and routine care needs. 3. Service will only be billed when office clinical staff spend 20 minutes or more in a month to coordinate care. 4. Only one practitioner may furnish and bill the service in a calendar month. 5.The patient may stop CCM services at any time (effective at the end of the month) by phone call to the office staff. 6. The patient will be responsible for cost sharing (co-pay) of up to 20% of the service fee (after annual deductible is met). Patient agreed to services and consent obtained.  Patient agreed to services and verbal consent obtained.   Assessment: Review of patient past medical history, allergies, medications, health status, including review of consultants reports, laboratory and other test data, was performed as part of comprehensive evaluation and provision of chronic care management services.   SDOH (Social Determinants of Health) assessments and interventions performed:    CCM Care Plan  Allergies  Allergen Reactions   Contrast Media [Iodinated Diagnostic Agents] Swelling    Of face,  especially eyes   Gadobutrol Other (See Comments)    Felt extremities go numb briefly as contrast injected.  Had cough (bronchospasm?) briefly after scan.  Resolved on own without treatment.  07/13/15   Gadolinium Derivatives Other (See Comments)    Felt extremities go numb briefly as contrast injected.  Had cough (bronchospasm?) briefly after scan.  Resolved on own without treatment.  07/13/15   Crestor [Rosuvastatin]     myalgias   Lidocaine Other (See Comments)    Facial swelling after dental procedure   Lovastatin     myalgias   Pravachol [Pravastatin Sodium]    Latex Rash and Other (See Comments)    and irritation    Outpatient Encounter Medications as of 12/01/2020  Medication Sig   acetaminophen (TYLENOL) 500 MG tablet Take 2 tablets (1,000 mg total) by mouth every 6 (six) hours as needed. (Patient taking differently: Take 1,000 mg by mouth every 6 (six) hours as needed for mild pain.)   amLODipine (NORVASC) 5 MG tablet Take 1 tablet (5 mg total) by mouth daily.   aspirin 81 MG tablet Take 81 mg by mouth at bedtime.   Cholecalciferol (VITAMIN D) 50 MCG (2000 UT) tablet Take 4,000 Units by mouth daily.   diclofenac Sodium (VOLTAREN) 1 % GEL diclofenac 1 % topical gel  APPLY 2-4 GRAMS TO AFFECTED JOINT UP TO 4 TIMES DAILY AS NEEDED   losartan (COZAAR) 50 MG tablet Take 50 mg by mouth daily. Take 1 Tablet Daily   metoprolol tartrate (LOPRESSOR) 50 MG tablet Take 50 mg by mouth 2 (two) times daily. Take 1 Tablet Twice Daily   Multiple Minerals-Vitamins (GNP CAL  MAG ZINC +D3 PO) Take 1 tablet by mouth in the morning and at bedtime.   nitroGLYCERIN (NITROSTAT) 0.4 MG SL tablet Place 1 tablet (0.4 mg total) under the tongue every 5 (five) minutes as needed for chest pain.   NONI, MORINDA CITRIFOLIA, PO Take 600 mg by mouth 2 (two) times daily.   No facility-administered encounter medications on file as of 12/01/2020.    Patient Active Problem List   Diagnosis Date Noted   Acute kidney  injury superimposed on chronic kidney disease ([place]) 07/18/2020   Hypertensive urgency 07/17/2020   S/P CABG x 4 04/26/2020   Polyarthritis with positive rheumatoid factor ([place]) 02/10/2020   Statin intolerance 11/26/2019   CAD (coronary artery disease) 04/05/2019   Hypertensive nephropathy 03/12/2018   Chronic renal disease, stage II 03/12/2018   Estrogen deficiency 03/12/2018   Hyperlipidemia LDL goal <70 10/20/2017   Menopause 07/19/2011   Essential hypertension    Hepatitis C     Conditions to be addressed/monitored: Hypertensive heart and renal disease with renal failure, stage 1 through stage 4 or unspecified chronic kidney disease, without heart failure; Coronary artery disease due to lipid rich plaque  Care Plan : Assist with Chronic Care Management and Care Coordination needs  Updates made by Lynne Logan, RN since 12/01/2020 12:00 AM     Problem: Assist with Chronic Care Management and Care Coordination needs   Priority: High  Note:   Current Barriers:  Chronic Disease Management support, education, chronic care management and care coordination needs related to Hypertensive heart and renal disease with renal failure, stage 1 through stage 4 or unspecified chronic kidney disease, without heart failure; Coronary artery disease due to lipid rich plaque with RNCM, SW and Pharmacy Care Management and Care coordination needs Case Manager Clinical Goal(s):  Patient will work with the CCM team to address needs related to chronic care management and care coordination needs related to Hypertensive heart and renal disease with renal failure, stage 1 through stage 4 or unspecified chronic kidney disease, without heart failure; Coronary artery disease due to lipid rich plaque  with RNCM, SW and Pharmacy Care Management and Care Coordination needs Interventions:  Collaborated with BSW to initiate plan of care to address needs related to chronic care management and care coordination needs  related to Hypertensive heart and renal disease with renal failure, stage 1 through stage 4 or unspecified chronic kidney disease, without heart failure; Coronary artery disease due to lipid rich plaque with RNCM, SW and Pharmacy Care Management and Care Coordination needs Collaboration with Glendale Chard, MD regarding development and update of comprehensive plan of care as evidenced by provider attestation and co-signature Inter-disciplinary care team collaboration (see longitudinal plan of care) Patient Goals/Self-Care Activities patient will:   - Patient will work with the CCM team to address chronic care management and care coordination needs and will continue to work with the clinical team to address health care and disease management related needs.    Follow Up Plan: The care management team will reach out to the patient again over the next 30-45 days.      Plan:Telephone follow up appointment with care management team member scheduled for:  12/08/20  Barb Merino, RN, BSN, CCM Care Management Coordinator [facility]  Direct Phone: [phone]

## 2020-12-01 NOTE — Patient Instructions (Signed)
Social Worker Visit Information  Goals we discussed today:   Goals Addressed             This Visit's Progress    Mobility and Independence Optimized       Timeframe:  Short-Term Goal Priority:  Medium Start Date:   10.4.22                                             Next planned outreach: 11.14.22  Patient Goals/Self-Care Activities patient will:   - Follow up with her primary care provider regarding neck pain -Participate in telephone assessment with [place] on 10/11 -Contact SW as needed prior to next scheduled call         Materials provided: No: Patient declined  Ms. Ehmann was given information about Chronic Care Management services today including:  CCM service includes personalized support from designated clinical staff supervised by her physician, including individualized plan of care and coordination with other care providers 24/7 contact phone numbers for assistance for urgent and routine care needs. Service will only be billed when office clinical staff spend 20 minutes or more in a month to coordinate care. Only one practitioner may furnish and bill the service in a calendar month. The patient may stop CCM services at any time (effective at the end of the month) by phone call to the office staff. The patient will be responsible for cost sharing (co-pay) of up to 20% of the service fee (after annual deductible is met).  Patient agreed to services and verbal consent obtained.   Patient verbalizes understanding of instructions provided today and agrees to view in [place].   Follow up plan: SW will follow up with patient by phone over the next 45 days   Daneen Schick, BSW, CDP Social Worker, Certified Dementia Practitioner [place] [facility] [phone]

## 2020-12-02 ENCOUNTER — Telehealth: Payer: Self-pay

## 2020-12-02 DIAGNOSIS — Z48812 Encounter for surgical aftercare following surgery on the circulatory system: Secondary | ICD-10-CM | POA: Diagnosis not present

## 2020-12-02 DIAGNOSIS — Z951 Presence of aortocoronary bypass graft: Secondary | ICD-10-CM | POA: Diagnosis not present

## 2020-12-02 NOTE — Telephone Encounter (Signed)
  Care Management   Follow Up Note   12/02/2020 Name: Virginia Delgado MRN: [id number] DOB: 1945/09/29   Referred by: Glendale Chard, MD Reason for referral : Chronic Care Management (Unsuccessful call)   An unsuccessful telephone outreach was attempted today. The patient was referred to the case management team for assistance with care management and care coordination. SW left a HIPAA compliant voice message requesting a return call.  Follow Up Plan: The care management team will reach out to the patient again over the next 14 days.   Daneen Schick, BSW, CDP Social Worker, Certified Dementia Practitioner [place] [facility] [phone]

## 2020-12-03 DIAGNOSIS — Z48812 Encounter for surgical aftercare following surgery on the circulatory system: Secondary | ICD-10-CM | POA: Diagnosis not present

## 2020-12-03 DIAGNOSIS — Z951 Presence of aortocoronary bypass graft: Secondary | ICD-10-CM | POA: Diagnosis not present

## 2020-12-04 ENCOUNTER — Telehealth: Payer: Self-pay | Admitting: Cardiovascular Disease

## 2020-12-04 ENCOUNTER — Other Ambulatory Visit (HOSPITAL_BASED_OUTPATIENT_CLINIC_OR_DEPARTMENT_OTHER): Payer: Self-pay

## 2020-12-04 MED ORDER — LOSARTAN POTASSIUM 50 MG PO TABS: 50.0000 mg | ORAL_TABLET | Freq: Every day | ORAL | 3 refills | Status: DC

## 2020-12-04 NOTE — Telephone Encounter (Signed)
RX sent in for the pts Losartan per [facility] request.

## 2020-12-04 NOTE — Telephone Encounter (Signed)
 *  STAT* If patient is at the pharmacy, call can be transferred to refill team.   1. Which medications need to be refilled? (please list name of each medication and dose if known) losartan (COZAAR) 50 MG tablet  2. Which pharmacy/location (including street and city if local pharmacy) is medication to be sent to? [id number] [place], [place] [address]  3. Do they need a 30 day or 90 day supply? 90 days

## 2020-12-08 ENCOUNTER — Telehealth: Payer: Medicare HMO

## 2020-12-22 ENCOUNTER — Ambulatory Visit: Payer: Self-pay

## 2020-12-22 ENCOUNTER — Telehealth: Payer: Medicare HMO

## 2020-12-22 ENCOUNTER — Telehealth: Payer: Self-pay | Admitting: Internal Medicine

## 2020-12-22 DIAGNOSIS — I2510 Atherosclerotic heart disease of native coronary artery without angina pectoris: Secondary | ICD-10-CM

## 2020-12-22 DIAGNOSIS — I1310 Hypertensive heart and chronic kidney disease without heart failure, with stage 1 through stage 4 chronic kidney disease, or unspecified chronic kidney disease: Secondary | ICD-10-CM

## 2020-12-22 NOTE — Telephone Encounter (Signed)
Left message for patient to call back and schedule Medicare Annual Wellness Visit (AWV) either virtually or in office.  Left both my jabber number [phone] and office number    Last AWV 01/22/20  please schedule at anytime with [facility]    This should be a 45 minute visit.    [place]land Falling can do calendar year

## 2020-12-23 NOTE — Chronic Care Management (AMB) (Signed)
Chronic Care Management   CCM RN Visit Note  12/22/2020 Name: Virginia Delgado MRN: [id number] DOB: 1945-06-15  Subjective: Virginia Delgado is a 75 y.o. year old female who is a primary care patient of Glendale Chard, MD. The care management team was consulted for assistance with disease management and care coordination needs.    Engaged with patient by telephone for initial visit in response to provider referral for case management and/or care coordination services.   Consent to Services:  The patient was given information about Chronic Care Management services, agreed to services, and gave verbal consent prior to initiation of services.  Please see initial visit note for detailed documentation.   Patient agreed to services and verbal consent obtained.   Assessment: Review of patient past medical history, allergies, medications, health status, including review of consultants reports, laboratory and other test data, was performed as part of comprehensive evaluation and provision of chronic care management services.   SDOH (Social Determinants of Health) assessments and interventions performed:    CCM Care Plan  Allergies  Allergen Reactions   Contrast Media [Iodinated Diagnostic Agents] Swelling    Of face, especially eyes   Gadobutrol Other (See Comments)    Felt extremities go numb briefly as contrast injected.  Had cough (bronchospasm?) briefly after scan.  Resolved on own without treatment.  07/13/15   Gadolinium Derivatives Other (See Comments)    Felt extremities go numb briefly as contrast injected.  Had cough (bronchospasm?) briefly after scan.  Resolved on own without treatment.  07/13/15   Crestor [Rosuvastatin]     myalgias   Lidocaine Other (See Comments)    Facial swelling after dental procedure   Lovastatin     myalgias   Pravachol [Pravastatin Sodium]    Latex Rash and Other (See Comments)    and irritation    Outpatient Encounter Medications as of 12/22/2020  Medication  Sig   acetaminophen (TYLENOL) 500 MG tablet Take 2 tablets (1,000 mg total) by mouth every 6 (six) hours as needed. (Patient taking differently: Take 1,000 mg by mouth every 6 (six) hours as needed for mild pain.)   aspirin 81 MG tablet Take 81 mg by mouth at bedtime.   Cholecalciferol (VITAMIN D) 50 MCG (2000 UT) tablet Take 4,000 Units by mouth daily.   diclofenac Sodium (VOLTAREN) 1 % GEL diclofenac 1 % topical gel  APPLY 2-4 GRAMS TO AFFECTED JOINT UP TO 4 TIMES DAILY AS NEEDED   losartan (COZAAR) 50 MG tablet Take 1 tablet (50 mg total) by mouth daily.   metoprolol tartrate (LOPRESSOR) 50 MG tablet Take 50 mg by mouth 2 (two) times daily. Take 1 Tablet Twice Daily   Multiple Minerals-Vitamins (GNP CAL MAG ZINC +D3 PO) Take 1 tablet by mouth in the morning and at bedtime.   NONI, MORINDA CITRIFOLIA, PO Take 600 mg by mouth 2 (two) times daily.   No facility-administered encounter medications on file as of 12/22/2020.    Patient Active Problem List   Diagnosis Date Noted   Acute kidney injury superimposed on chronic kidney disease ([place]) 07/18/2020   Hypertensive urgency 07/17/2020   S/P CABG x 4 04/26/2020   Polyarthritis with positive rheumatoid factor ([place]) 02/10/2020   Statin intolerance 11/26/2019   CAD (coronary artery disease) 04/05/2019   Hypertensive heart and renal disease 03/12/2018   Chronic renal disease, stage II 03/12/2018   Estrogen deficiency 03/12/2018   Hyperlipidemia LDL goal <70 10/20/2017   Menopause 07/19/2011   Essential hypertension  Hepatitis C     Conditions to be addressed/monitored: Hypertensive heart and renal disease with renal failure, stage 1 through stage 4 or unspecified chronic kidney disease, without heart failure; Coronary artery disease due to lipid rich plaque   Care Plan : RN Care Manager Plan of Care  Updates made by Lynne Logan, RN since 12/23/2020 12:00 AM     Problem: Chronic disease education and Care Coordination needs for  Hypertensive heart and renal disease with renal failure, stage 1 through stage 4 or unspecified chronic kidney disease, without heart failure; Coronary artery disease due to lipid rich plaque   Priority: High     Long-Range Goal: Assist with Chronic disease education and Care Coordination needs for Hypertensive heart and renal disease with renal failure, stage 1 through stage 4 or unspecified chronic kidney disease; Coronary artery disease due to lipid rich plaque   Start Date: 12/22/2020  Expected End Date: 12/22/2021  This Visit's Progress: On track  Priority: High  Note:   Current Barriers:  Knowledge Deficits related to plan of care for management of Hypertensive heart and renal disease with renal failure, stage 1 through stage 4 or unspecified chronic kidney disease, without heart failure; Coronary artery disease due to lipid rich plaque Chronic Disease Management support and education needs related to Hypertensive heart and renal disease with renal failure, stage 1 through stage 4 or unspecified chronic kidney disease, without heart failure; Coronary artery disease due to lipid rich plaque  RNCM Clinical Goal(s):  Patient will demonstrate Ongoing health management independence   continue to work with RN Care Manager to address care management and care coordination needs related to  Hypertensive heart and renal disease with renal failure, stage 1 through stage 4 or unspecified chronic kidney disease, without heart failure; Coronary artery disease due to lipid rich plaque will demonstrate ongoing self health care management ability    through collaboration with RN Care manager, provider, and care team.   Interventions: 1:1 collaboration with primary care provider regarding development and update of comprehensive plan of care as evidenced by provider attestation and co-signature Inter-disciplinary care team collaboration (see longitudinal plan of care) Evaluation of current treatment plan  related to  self management and patient's adherence to plan as established by provider  Hypertension Interventions: Last practice recorded BP readings:  BP Readings from Last 3 Encounters:  11/17/20 132/78  10/28/20 (!) 148/84  10/20/20 138/82  Most recent eGFR/CrCl:  Lab Results  Component Value Date   EGFR 67 09/04/2020    No components found for: CRCL  Reviewed medications with patient and discussed importance of compliance; Counseled on the importance of exercise goals with target of 150 minutes per week Discussed plans with patient for ongoing care management follow up and provided patient with direct contact information for care management team; Advised patient, providing education and rationale, to monitor blood pressure daily and record, calling PCP for findings outside established parameters;  Provided education on prescribed diet low Sodium diet ;  Mailed printed educational materials related to Why Should I Restrict Sodium?; How to Accurately Measure Blood Pressure at Home CAD Interventions:  Assessed understanding of CAD diagnosis Medications reviewed including medications utilized in CAD treatment plan Provided education on importance of blood pressure control in management of CAD; Counseled on importance of regular laboratory monitoring as prescribed; Reviewed Importance of taking all medications as prescribed Mailed printed educational materials related to My Cholesterol Guide; Cooking to lower Cholesterol Determined patient recently completed Cardiac Rehab with  good effectiveness Lipid Panel     Component Value Date/Time   CHOL 206 (H) 09/24/2020 0921   TRIG 49 09/24/2020 0921   HDL 78 09/24/2020 0921   CHOLHDL 2.6 09/24/2020 0921   LDLCALC 119 (H) 09/24/2020 0921   LABVLDL 9 09/24/2020 0921     Patient Goals/Self-Care Activities: Patient will self administer medications as prescribed Patient will attend all scheduled provider appointments Patient will call  pharmacy for medication refills Patient will attend church or other social activities Patient will continue to perform ADL's independently Patient will continue to perform IADL's independently Patient will call provider office for new concerns or questions  Follow Up Plan:  Telephone follow up appointment with care management team member scheduled for:  02/16/21     Plan:Telephone follow up appointment with care management team member scheduled for:  02/16/21  Barb Merino, RN, BSN, CCM Care Management Coordinator [facility]  Direct Phone: [phone]

## 2020-12-23 NOTE — Patient Instructions (Signed)
Visit Information   Consent to CCM Services: Ms. Jacobson was given information about Chronic Care Management services including:  CCM service includes personalized support from designated clinical staff supervised by her physician, including individualized plan of care and coordination with other care providers 24/7 contact phone numbers for assistance for urgent and routine care needs. Service will only be billed when office clinical staff spend 20 minutes or more in a month to coordinate care. Only one practitioner may furnish and bill the service in a calendar month. The patient may stop CCM services at any time (effective at the end of the month) by phone call to the office staff. The patient will be responsible for cost sharing (co-pay) of up to 20% of the service fee (after annual deductible is met).  Patient agreed to services and verbal consent obtained.   The patient verbalized understanding of instructions, educational materials, and care plan provided today and declined offer to receive copy of patient instructions, educational materials, and care plan.   Telephone follow up appointment with care management team member scheduled for: 02/16/21  Barb Merino, RN, BSN, CCM Care Management Coordinator [facility]  Direct Phone: [phone]    CLINICAL CARE PLAN:  Patient Care Plan: RN Care Manager Plan of Care     Problem Identified: Chronic disease education and Care Coordination needs for Hypertensive heart and renal disease with renal failure, stage 1 through stage 4 or unspecified chronic kidney disease, without heart failure; Coronary artery disease due to lipid rich plaque   Priority: High     Long-Range Goal: Assist with Chronic disease education and Care Coordination needs for Hypertensive heart and renal disease with renal failure, stage 1 through stage 4 or unspecified chronic kidney disease; Coronary artery disease due to lipid  rich plaque   Start Date: 12/22/2020  Expected End Date: 12/22/2021  This Visit's Progress: On track  Priority: High  Note:   Current Barriers:  Knowledge Deficits related to plan of care for management of Hypertensive heart and renal disease with renal failure, stage 1 through stage 4 or unspecified chronic kidney disease, without heart failure; Coronary artery disease due to lipid rich plaque Chronic Disease Management support and education needs related to Hypertensive heart and renal disease with renal failure, stage 1 through stage 4 or unspecified chronic kidney disease, without heart failure; Coronary artery disease due to lipid rich plaque  RNCM Clinical Goal(s):  Patient will demonstrate Ongoing health management independence   continue to work with RN Care Manager to address care management and care coordination needs related to  Hypertensive heart and renal disease with renal failure, stage 1 through stage 4 or unspecified chronic kidney disease, without heart failure; Coronary artery disease due to lipid rich plaque will demonstrate ongoing self health care management ability    through collaboration with RN Care manager, provider, and care team.   Interventions: 1:1 collaboration with primary care provider regarding development and update of comprehensive plan of care as evidenced by provider attestation and co-signature Inter-disciplinary care team collaboration (see longitudinal plan of care) Evaluation of current treatment plan related to  self management and patient's adherence to plan as established by provider  Hypertension Interventions: Last practice recorded BP readings:  BP Readings from Last 3 Encounters:  11/17/20 132/78  10/28/20 (!) 148/84  10/20/20 138/82  Most recent eGFR/CrCl:  Lab Results  Component Value Date   EGFR 67 09/04/2020    No components found for: CRCL  Reviewed  medications with patient and discussed importance of compliance; Counseled on the  importance of exercise goals with target of 150 minutes per week Discussed plans with patient for ongoing care management follow up and provided patient with direct contact information for care management team; Advised patient, providing education and rationale, to monitor blood pressure daily and record, calling PCP for findings outside established parameters;  Provided education on prescribed diet low Sodium diet ;  Mailed printed educational materials related to Why Should I Restrict Sodium?; How to Accurately Measure Blood Pressure at Home CAD Interventions:  Assessed understanding of CAD diagnosis Medications reviewed including medications utilized in CAD treatment plan Provided education on importance of blood pressure control in management of CAD; Counseled on importance of regular laboratory monitoring as prescribed; Reviewed Importance of taking all medications as prescribed Mailed printed educational materials related to My Cholesterol Guide; Cooking to lower Cholesterol Determined patient recently completed Cardiac Rehab with good effectiveness Lipid Panel     Component Value Date/Time   CHOL 206 (H) 09/24/2020 0921   TRIG 49 09/24/2020 0921   HDL 78 09/24/2020 0921   CHOLHDL 2.6 09/24/2020 0921   LDLCALC 119 (H) 09/24/2020 0921   LABVLDL 9 09/24/2020 0921     Patient Goals/Self-Care Activities: Patient will self administer medications as prescribed Patient will attend all scheduled provider appointments Patient will call pharmacy for medication refills Patient will attend church or other social activities Patient will continue to perform ADL's independently Patient will continue to perform IADL's independently Patient will call provider office for new concerns or questions  Follow Up Plan:  Telephone follow up appointment with care management team member scheduled for:  02/16/21

## 2020-12-28 DIAGNOSIS — I2510 Atherosclerotic heart disease of native coronary artery without angina pectoris: Secondary | ICD-10-CM

## 2020-12-28 DIAGNOSIS — I2583 Coronary atherosclerosis due to lipid rich plaque: Secondary | ICD-10-CM | POA: Diagnosis not present

## 2020-12-28 DIAGNOSIS — E785 Hyperlipidemia, unspecified: Secondary | ICD-10-CM | POA: Diagnosis not present

## 2020-12-28 DIAGNOSIS — I1310 Hypertensive heart and chronic kidney disease without heart failure, with stage 1 through stage 4 chronic kidney disease, or unspecified chronic kidney disease: Secondary | ICD-10-CM

## 2020-12-30 ENCOUNTER — Other Ambulatory Visit: Payer: Self-pay | Admitting: Cardiovascular Disease

## 2020-12-30 DIAGNOSIS — I2583 Coronary atherosclerosis due to lipid rich plaque: Secondary | ICD-10-CM | POA: Diagnosis not present

## 2020-12-30 DIAGNOSIS — E785 Hyperlipidemia, unspecified: Secondary | ICD-10-CM | POA: Diagnosis not present

## 2020-12-30 DIAGNOSIS — I2510 Atherosclerotic heart disease of native coronary artery without angina pectoris: Secondary | ICD-10-CM | POA: Diagnosis not present

## 2020-12-30 LAB — LIPID PANEL
Chol/HDL Ratio: 3 ratio (ref 0.0–4.4)
Cholesterol, Total: 221 mg/dL — ABNORMAL HIGH (ref 100–199)
HDL: 73 mg/dL (ref >39)
LDL Chol Calc (NIH): 133 mg/dL — ABNORMAL HIGH (ref 0–99)
Triglycerides: 86 mg/dL (ref 0–149)
VLDL Cholesterol Cal: 15 mg/dL (ref 5–40)

## 2020-12-30 NOTE — Telephone Encounter (Signed)
Rx(s) sent to pharmacy electronically.  

## 2020-12-31 ENCOUNTER — Telehealth: Payer: Self-pay | Admitting: Pharmacist

## 2020-12-31 NOTE — Telephone Encounter (Signed)
Alled patient

## 2021-01-11 ENCOUNTER — Telehealth: Payer: Medicare HMO

## 2021-01-14 ENCOUNTER — Ambulatory Visit (INDEPENDENT_AMBULATORY_CARE_PROVIDER_SITE_OTHER): Payer: Medicare HMO

## 2021-01-14 VITALS — Ht 60.03 in | Wt 130.0 lb

## 2021-01-14 DIAGNOSIS — Z0000 Encounter for general adult medical examination without abnormal findings: Secondary | ICD-10-CM

## 2021-01-14 NOTE — Patient Instructions (Signed)
Ms. Virginia Delgado , Thank you for taking time to come for your Medicare Wellness Visit. I appreciate your ongoing commitment to your health goals. Please review the following plan we discussed and let me know if I can assist you in the future.   Screening recommendations/referrals: Colonoscopy: completed 11/02/2017 Mammogram: completed 03/10/2020 Bone Density: completed 12/03/2019 Recommended yearly ophthalmology/optometry visit for glaucoma screening and checkup Recommended yearly dental visit for hygiene and checkup  Vaccinations: Influenza vaccine: decline  Pneumococcal vaccine: completed 09/29/2018 Tdap vaccine: completed 09/29/2018, due 09/28/2028 Shingles vaccine: completed   Covid-19:12/22/2020, 02/10/2020, 05/14/2019, 04/21/2019  Advanced directives: Please bring a copy of your POA (Power of Attorney) and/or Living Will to your next appointment.    Conditions/risks identified: none  Next appointment: Follow up in one year for your annual wellness visit    Preventive Care 65 Years and Older, Female Preventive care refers to lifestyle choices and visits with your health care provider that can promote health and wellness. What does preventive care include? A yearly physical exam. This is also called an annual well check. Dental exams once or twice a year. Routine eye exams. Ask your health care provider how often you should have your eyes checked. Personal lifestyle choices, including: Daily care of your teeth and gums. Regular physical activity. Eating a healthy diet. Avoiding tobacco and drug use. Limiting alcohol use. Practicing safe sex. Taking low-dose aspirin every day. Taking vitamin and mineral supplements as recommended by your health care provider. What happens during an annual well check? The services and screenings done by your health care provider during your annual well check will depend on your age, overall health, lifestyle risk factors, and family history of  disease. Counseling  Your health care provider may ask you questions about your: Alcohol use. Tobacco use. Drug use. Emotional well-being. Home and relationship well-being. Sexual activity. Eating habits. History of falls. Memory and ability to understand (cognition). Work and work Statistician. Reproductive health. Screening  You may have the following tests or measurements: Height, weight, and BMI. Blood pressure. Lipid and cholesterol levels. These may be checked every 5 years, or more frequently if you are over 77 years old. Skin check. Lung cancer screening. You may have this screening every year starting at age 12 if you have a 30-pack-year history of smoking and currently smoke or have quit within the past 15 years. Fecal occult blood test (FOBT) of the stool. You may have this test every year starting at age 84. Flexible sigmoidoscopy or colonoscopy. You may have a sigmoidoscopy every 5 years or a colonoscopy every 10 years starting at age 28. Hepatitis C blood test. Hepatitis B blood test. Sexually transmitted disease (STD) testing. Diabetes screening. This is done by checking your blood sugar (glucose) after you have not eaten for a while (fasting). You may have this done every 1-3 years. Bone density scan. This is done to screen for osteoporosis. You may have this done starting at age 69. Mammogram. This may be done every 1-2 years. Talk to your health care provider about how often you should have regular mammograms. Talk with your health care provider about your test results, treatment options, and if necessary, the need for more tests. Vaccines  Your health care provider may recommend certain vaccines, such as: Influenza vaccine. This is recommended every year. Tetanus, diphtheria, and acellular pertussis (Tdap, Td) vaccine. You may need a Td booster every 10 years. Zoster vaccine. You may need this after age 25. Pneumococcal 13-valent conjugate (PCV13) vaccine. One  dose is recommended after age 35. Pneumococcal polysaccharide (PPSV23) vaccine. One dose is recommended after age 4. Talk to your health care provider about which screenings and vaccines you need and how often you need them. This information is not intended to replace advice given to you by your health care provider. Make sure you discuss any questions you have with your health care provider. Document Released: 03/13/2015 Document Revised: 11/04/2015 Document Reviewed: 12/16/2014 Elsevier Interactive Patient Education  2017 [place] Prevention in the Home Falls can cause injuries. They can happen to people of all ages. There are many things you can do to make your home safe and to help prevent falls. What can I do on the outside of my home? Regularly fix the edges of walkways and driveways and fix any cracks. Remove anything that might make you trip as you walk through a door, such as a raised step or threshold. Trim any bushes or trees on the path to your home. Use bright outdoor lighting. Clear any walking paths of anything that might make someone trip, such as rocks or tools. Regularly check to see if handrails are loose or broken. Make sure that both sides of any steps have handrails. Any raised decks and porches should have guardrails on the edges. Have any leaves, snow, or ice cleared regularly. Use sand or salt on walking paths during winter. Clean up any spills in your garage right away. This includes oil or grease spills. What can I do in the bathroom? Use night lights. Install grab bars by the toilet and in the tub and shower. Do not use towel bars as grab bars. Use non-skid mats or decals in the tub or shower. If you need to sit down in the shower, use a plastic, non-slip stool. Keep the floor dry. Clean up any water that spills on the floor as soon as it happens. Remove soap buildup in the tub or shower regularly. Attach bath mats securely with double-sided  non-slip rug tape. Do not have throw rugs and other things on the floor that can make you trip. What can I do in the bedroom? Use night lights. Make sure that you have a light by your bed that is easy to reach. Do not use any sheets or blankets that are too big for your bed. They should not hang down onto the floor. Have a firm chair that has side arms. You can use this for support while you get dressed. Do not have throw rugs and other things on the floor that can make you trip. What can I do in the kitchen? Clean up any spills right away. Avoid walking on wet floors. Keep items that you use a lot in easy-to-reach places. If you need to reach something above you, use a strong step stool that has a grab bar. Keep electrical cords out of the way. Do not use floor polish or wax that makes floors slippery. If you must use wax, use non-skid floor wax. Do not have throw rugs and other things on the floor that can make you trip. What can I do with my stairs? Do not leave any items on the stairs. Make sure that there are handrails on both sides of the stairs and use them. Fix handrails that are broken or loose. Make sure that handrails are as long as the stairways. Check any carpeting to make sure that it is firmly attached to the stairs. Fix any carpet that is loose or worn. Avoid  having throw rugs at the top or bottom of the stairs. If you do have throw rugs, attach them to the floor with carpet tape. Make sure that you have a light switch at the top of the stairs and the bottom of the stairs. If you do not have them, ask someone to add them for you. What else can I do to help prevent falls? Wear shoes that: Do not have high heels. Have rubber bottoms. Are comfortable and fit you well. Are closed at the toe. Do not wear sandals. If you use a stepladder: Make sure that it is fully opened. Do not climb a closed stepladder. Make sure that both sides of the stepladder are locked into place. Ask  someone to hold it for you, if possible. Clearly mark and make sure that you can see: Any grab bars or handrails. First and last steps. Where the edge of each step is. Use tools that help you move around (mobility aids) if they are needed. These include: Canes. Walkers. Scooters. Crutches. Turn on the lights when you go into a dark area. Replace any light bulbs as soon as they burn out. Set up your furniture so you have a clear path. Avoid moving your furniture around. If any of your floors are uneven, fix them. If there are any pets around you, be aware of where they are. Review your medicines with your doctor. Some medicines can make you feel dizzy. This can increase your chance of falling. Ask your doctor what other things that you can do to help prevent falls. This information is not intended to replace advice given to you by your health care provider. Make sure you discuss any questions you have with your health care provider. Document Released: 12/11/2008 Document Revised: 07/23/2015 Document Reviewed: 03/21/2014 Elsevier Interactive Patient Education  2017 [employer].

## 2021-01-14 NOTE — Progress Notes (Signed)
I connected with  Signa Bosshart today via telehealth video enabled device and verified that I am speaking with the correct person using two identifiers.   Location: Patient: home Provider: work  Persons participating in virtual visit: Loreen Konecny, Glenna Durand LPN  I discussed the limitations, risks, security and privacy concerns of performing an evaluation and management service by video and the availability of in person appointments. The patient expressed understanding and agreed to proceed.   Some vital signs may be absent or patient reported.      Subjective:   Virginia Delgado is a 75 y.o. female who presents for Medicare Annual (Subsequent) preventive examination.  Review of Systems     Cardiac Risk Factors include: advanced age (>42men, >65 women);dyslipidemia;hypertension;sedentary lifestyle     Objective:    Today's Vitals   01/14/21 0912 01/14/21 0913  Weight: 130 lb (59 kg)   Height: 5' 0.03" (1.525 m)   PainSc:  4    Body mass index is 25.37 kg/m.  Advanced Directives 01/14/2021 07/18/2020 07/17/2020 04/20/2020 01/22/2020 09/26/2018 05/10/2018  Does Patient Have a Medical Advance Directive? Yes No - No Yes Yes No  Type of Paramedic of [place];Living will - - - Living will;Healthcare Power of [place];Living will -  Does patient want to make changes to medical advance directive? - - Yes (ED - send information to MyChart) - - - -  Copy of [place] in Chart? No - copy requested - - - No - copy requested No - copy requested -  Would patient like information on creating a medical advance directive? - No - Patient declined - No - Patient declined - - No - Patient declined    Current Medications (verified) Outpatient Encounter Medications as of 01/14/2021  Medication Sig   acetaminophen (TYLENOL) 500 MG tablet Take 2 tablets (1,000 mg total) by mouth every 6 (six) hours as needed. (Patient taking  differently: Take 1,000 mg by mouth every 6 (six) hours as needed for mild pain.)   aspirin 81 MG tablet Take 81 mg by mouth at bedtime.   Cholecalciferol (VITAMIN D) 50 MCG (2000 UT) tablet Take 4,000 Units by mouth daily.   co-enzyme Q-10 30 MG capsule Take 30 mg by mouth daily.   diclofenac Sodium (VOLTAREN) 1 % GEL diclofenac 1 % topical gel  APPLY 2-4 GRAMS TO AFFECTED JOINT UP TO 4 TIMES DAILY AS NEEDED   losartan (COZAAR) 50 MG tablet Take 1 tablet (50 mg total) by mouth daily.   metoprolol tartrate (LOPRESSOR) 50 MG tablet Take 50 mg by mouth 2 (two) times daily. Take 1 Tablet Twice Daily   metoprolol tartrate (LOPRESSOR) 50 MG tablet Take 1 tablet (50 mg total) by mouth 2 (two) times daily.   Multiple Minerals-Vitamins (GNP CAL MAG ZINC +D3 PO) Take 1 tablet by mouth in the morning and at bedtime.   NONI, MORINDA CITRIFOLIA, PO Take 600 mg by mouth 2 (two) times daily.   amLODipine (NORVASC) 5 MG tablet Take 1 tablet (5 mg total) by mouth daily.   nitroGLYCERIN (NITROSTAT) 0.4 MG SL tablet Place 1 tablet (0.4 mg total) under the tongue every 5 (five) minutes as needed for chest pain.   No facility-administered encounter medications on file as of 01/14/2021.    Allergies (verified) Contrast media [iodinated diagnostic agents], Gadobutrol, Gadolinium derivatives, Crestor [rosuvastatin], Lidocaine, Lovastatin, Pravachol [pravastatin sodium], and Latex   History: Past Medical History:  Diagnosis Date  Coronary artery calcification 04/05/2019   Calcium score 93rd percentile 08/2018.   Dermatitis    Hx of hepatitis C    Hypertension    Past Surgical History:  Procedure Laterality Date   BUNIONECTOMY Bilateral    CESAREAN SECTION  1976   CESAREAN SECTION  1979   CLIPPING OF ATRIAL APPENDAGE Left 04/22/2020   Procedure: CLIPPING OF  LEFT ATRIAL APPENDAGE USING ATRICURE ATRICLIPFLEX-V 35;  Surgeon: Wonda Olds, MD;  Location: [place];  Service: Open Heart Surgery;  Laterality:  Left;   CORONARY ARTERY BYPASS GRAFT N/A 04/22/2020   Procedure: CORONARY ARTERY BYPASS GRAFTING (CABGX4). USING BILATERAL MAMMARIES;  Surgeon: Wonda Olds, MD;  Location: [place];  Service: Open Heart Surgery;  Laterality: N/A;  BIMA   LEFT HEART CATH AND CORONARY ANGIOGRAPHY N/A 04/20/2020   Procedure: LEFT HEART CATH AND CORONARY ANGIOGRAPHY;  Surgeon: Nelva Bush, MD;  Location: [facility];  Service: Cardiovascular;  Laterality: N/A;   RADIAL ARTERY HARVEST Right 04/22/2020   Procedure: RIGHT RADIAL ARTERY HARVESTING.;  Surgeon: Wonda Olds, MD;  Location: [facility];  Service: Open Heart Surgery;  Laterality: Right;   TEE WITHOUT CARDIOVERSION N/A 04/22/2020   Procedure: TRANSESOPHAGEAL ECHOCARDIOGRAM (TEE);  Surgeon: Wonda Olds, MD;  Location: [place];  Service: Open Heart Surgery;  Laterality: N/A;   UMBILICAL HERNIA REPAIR  1981   Family History  Problem Relation Age of Onset   Cancer Mother        breast   COPD Mother    Heart disease Father    Cancer Sister        sarcoma   Hypertension Brother    Ovarian cancer Maternal Grandmother    Rheum arthritis Paternal Grandmother    Hypertension Sister    Social History   Socioeconomic History   Marital status: Divorced    Spouse name: Not on file   Number of children: Not on file   Years of education: Not on file   Highest education level: Not on file  Occupational History   Not on file  Tobacco Use   Smoking status: Never   Smokeless tobacco: Never  Vaping Use   Vaping Use: Never used  Substance and Sexual Activity   Alcohol use: Not Currently    Comment: social   Drug use: No   Sexual activity: Yes    Birth control/protection: None  Other Topics Concern   Not on file  Social History Narrative   Not on file   Social Determinants of Health   Financial Resource Strain: Low Risk    Difficulty of Paying Living Expenses: Not hard at all  Food Insecurity: No Food Insecurity   Worried About  Charity fundraiser in the Last Year: Never true   [place] in the Last Year: Never true  Transportation Needs: No Transportation Needs   Lack of Transportation (Medical): No   Lack of Transportation (Non-Medical): No  Physical Activity: Inactive   Days of Exercise per Week: 0 days   Minutes of Exercise per Session: 0 min  Stress: No Stress Concern Present   Feeling of Stress : Only a little  Social Connections: Not on file    Tobacco Counseling Counseling given: Not Answered   Clinical Intake:  Pre-visit preparation completed: Yes  Pain : 0-10 Pain Score: 4  Pain Type: Acute pain Pain Location: Neck Pain Orientation: Right Pain Descriptors / Indicators: Sharp Pain Onset: 1 to 4 weeks ago  Pain Frequency: Intermittent     Nutritional Status: BMI 25 -29 Overweight Nutritional Risks: None Diabetes: No  How often do you need to have someone help you when you read instructions, pamphlets, or other written materials from your doctor or pharmacy?: 1 - Never What is the last grade level you completed in school?: masters degree  Diabetic? no  Interpreter Needed?: No  Information entered by :: NAllen LPN   Activities of Daily Living In your present state of health, do you have any difficulty performing the following activities: 01/14/2021 07/18/2020  Hearing? N N  Vision? Y N  Difficulty concentrating or making decisions? Y N  Walking or climbing stairs? Y N  Dressing or bathing? N N  Doing errands, shopping? N N  Preparing Food and eating ? N -  Using the Toilet? N -  In the past six months, have you accidently leaked urine? Y -  Do you have problems with loss of bowel control? N -  Managing your Medications? N -  Managing your Finances? N -  Housekeeping or managing your Housekeeping? N -  Some recent data might be hidden    Patient Care Team: Glendale Chard, MD as PCP - General (Internal Medicine) Skeet Latch, MD as PCP - Cardiology  (Cardiology) Rex Kras, Claudette Stapler, RN as [place] [facility] [place], Tillie Rung as Social Worker  Indicate any recent [place] you may have received from other than [facility] providers in the past year (date may be approximate).     Assessment:   This is a routine wellness examination for Fatiha.  Hearing/Vision screen Vision Screening - Comments:: Regular eye exams, Dr. Sabra Heck  Dietary issues and exercise activities discussed: Current Exercise Habits: The patient does not participate in regular exercise at present   Goals Addressed             This Visit's Progress    Patient Stated       01/14/2021, wants to strengthen core and walking better        Depression Screen PHQ 2/9 Scores 01/14/2021 01/22/2020 10/21/2019 09/26/2018 03/12/2018 01/19/2018  PHQ - 2 Score 0 0 0 0 0 0  PHQ- 9 Score - - - 1 - -    Fall Risk Fall Risk  01/14/2021 01/22/2020 11/12/2019 09/26/2018 03/12/2018  Falls in the past year? 0 0 0 0 0  Risk for fall due to : Medication side effect;Impaired mobility;Impaired balance/gait Medication side effect - Medication side effect -  Follow up Falls evaluation completed;Education provided;Falls prevention discussed Falls evaluation completed;Education provided;Falls prevention discussed - Falls evaluation completed;Education provided;Falls prevention discussed -    FALL RISK PREVENTION PERTAINING TO THE HOME:  Any stairs in or around the home? Yes  If so, are there any without handrails? No  Home free of loose throw rugs in walkways, pet beds, electrical cords, etc? Yes  Adequate lighting in your home to reduce risk of falls? Yes   ASSISTIVE DEVICES UTILIZED TO PREVENT FALLS:  Life alert? No  Use of a cane, walker or w/c? Yes  Grab bars in the bathroom? No  Shower chair or bench in shower? No  Elevated toilet seat or a handicapped toilet? No   TIMED UP AND GO:  Was the test performed? No .      Cognitive Function:     6CIT Screen  01/14/2021 01/22/2020 09/26/2018  What Year? 0 points 0 points 0 points  What month? 0 points 0 points 0 points  What time?  0 points 0 points 0 points  Count back from 20 0 points 0 points 0 points  Months in reverse 0 points 0 points 0 points  Repeat phrase 2 points 0 points 0 points  Total Score 2 0 0    Immunizations Immunization History  Administered Date(s) Administered   Hepatitis B 04/06/2011   PFIZER(Purple Top)SARS-COV-2 Vaccination 04/21/2019, 05/14/2019, 02/10/2020, 12/22/2020   Pneumococcal Conjugate-13 09/29/2018   Pneumococcal Polysaccharide-23 11/13/2013   Tdap 09/29/2018   Zoster Recombinat (Shingrix) 06/30/2017, 09/20/2017    TDAP status: Up to date  Flu Vaccine status: Declined, Education has been provided regarding the importance of this vaccine but patient still declined. Advised may receive this vaccine at local pharmacy or Health Dept. Aware to provide a copy of the vaccination record if obtained from local pharmacy or Health Dept. Verbalized acceptance and understanding.  Pneumococcal vaccine status: Up to date  Covid-19 vaccine status: Completed vaccines  Qualifies for Shingles Vaccine? Yes   Zostavax completed Yes   Shingrix Completed?: Yes  Screening Tests Health Maintenance  Topic Date Due   INFLUENZA VACCINE  05/28/2021 (Originally 09/28/2020)   COVID-19 Vaccine (5 - Booster for Pfizer series) 02/16/2021   MAMMOGRAM  03/10/2022   COLONOSCOPY (Pts 45-30yrs Insurance coverage will need to be confirmed)  11/03/2027   TETANUS/TDAP  09/28/2028   Pneumonia Vaccine 28+ Years old  Completed   DEXA SCAN  Completed   Hepatitis C Screening  Completed   Zoster Vaccines- Shingrix  Completed   HPV VACCINES  Aged Out    Health Maintenance  There are no preventive care reminders to display for this patient.   Colorectal cancer screening: Type of screening: Colonoscopy. Completed 11/02/2017. Repeat every 10 years  Mammogram status: Completed 03/10/2020.  Repeat every year  Bone Density status: Completed 12/03/2019.    Lung Cancer Screening: (Low Dose CT Chest recommended if Age 41-80 years, 30 pack-year currently smoking OR have quit w/in 15years.) does not qualify.   Lung Cancer Screening Referral: no  Additional Screening:  Hepatitis C Screening: does qualify; Completed 11/17/2020  Vision Screening: Recommended annual ophthalmology exams for early detection of glaucoma and other disorders of the eye. Is the patient up to date with their annual eye exam?  Yes  Who is the provider or what is the name of the office in which the patient attends annual eye exams? Dr. Sabra Heck If pt is not established with a provider, would they like to be referred to a provider to establish care? No .   Dental Screening: Recommended annual dental exams for proper oral hygiene  Community Resource Referral / Chronic Care Management: CRR required this visit?  No   CCM required this visit?  No      Plan:     I have personally reviewed and noted the following in the patient's chart:   Medical and social history Use of alcohol, tobacco or illicit drugs  Current medications and supplements including opioid prescriptions.  Functional ability and status Nutritional status Physical activity Advanced directives List of other physicians Hospitalizations, surgeries, and ER visits in previous 12 months Vitals Screenings to include cognitive, depression, and falls Referrals and appointments  In addition, I have reviewed and discussed with patient certain preventive protocols, quality metrics, and best practice recommendations. A written personalized care plan for preventive services as well as general preventive health recommendations were provided to patient.     Kellie Simmering, LPN   32/44/0102   Nurse Notes: Patient wanted clarification on  the dosage of metoprolol. She also wanted to know if she still had to take all three BP meds. She has been having  constant urination and she thinks it could be because of the BP meds. Message sent to Dr. Baird Cancer.

## 2021-01-15 NOTE — Telephone Encounter (Signed)
Patient LDL, trigs, and TC increased on supplements.  Needs to start Praluent when she returns from her trip after Thanksgiving

## 2021-02-11 ENCOUNTER — Other Ambulatory Visit: Payer: Self-pay

## 2021-02-11 ENCOUNTER — Encounter: Payer: Self-pay | Admitting: Cardiovascular Disease

## 2021-02-11 ENCOUNTER — Telehealth: Payer: Self-pay

## 2021-02-11 ENCOUNTER — Ambulatory Visit: Payer: Medicare HMO | Admitting: Cardiovascular Disease

## 2021-02-11 DIAGNOSIS — I498 Other specified cardiac arrhythmias: Secondary | ICD-10-CM | POA: Diagnosis not present

## 2021-02-11 DIAGNOSIS — Z951 Presence of aortocoronary bypass graft: Secondary | ICD-10-CM | POA: Diagnosis not present

## 2021-02-11 DIAGNOSIS — E785 Hyperlipidemia, unspecified: Secondary | ICD-10-CM | POA: Diagnosis not present

## 2021-02-11 DIAGNOSIS — R42 Dizziness and giddiness: Secondary | ICD-10-CM

## 2021-02-11 DIAGNOSIS — I2510 Atherosclerotic heart disease of native coronary artery without angina pectoris: Secondary | ICD-10-CM | POA: Diagnosis not present

## 2021-02-11 DIAGNOSIS — Z789 Other specified health status: Secondary | ICD-10-CM | POA: Diagnosis not present

## 2021-02-11 DIAGNOSIS — I10 Essential (primary) hypertension: Secondary | ICD-10-CM | POA: Diagnosis not present

## 2021-02-11 DIAGNOSIS — I4729 Other ventricular tachycardia: Secondary | ICD-10-CM | POA: Diagnosis not present

## 2021-02-11 MED ORDER — PRALUENT 150 MG/ML ~~LOC~~ SOAJ: 150.0000 mg | SUBCUTANEOUS | 11 refills | Status: DC

## 2021-02-11 MED ORDER — EZETIMIBE 10 MG PO TABS: 10.0000 mg | ORAL_TABLET | Freq: Every day | ORAL | 3 refills | Status: DC

## 2021-02-11 MED ORDER — METOPROLOL TARTRATE 50 MG PO TABS
ORAL_TABLET | ORAL | 1 refills | Status: DC
Start: 2021-02-11 — End: 2021-05-21

## 2021-02-11 MED ORDER — LOSARTAN POTASSIUM 25 MG PO TABS: 25.0000 mg | ORAL_TABLET | Freq: Every day | ORAL | 3 refills | Status: AC

## 2021-02-11 NOTE — Telephone Encounter (Signed)
Called and spoke w/pt and stated that they were approved for the praluent 150mg  q14d and rx sent, instructed the pt to complete labs post 6th dose and to contact the [employer] to get approved for a grant to help w/cost of med and the pt voiced understanding.

## 2021-02-11 NOTE — Patient Instructions (Signed)
Medication Instructions:   -Start ezetimibe (zetia) 10mg  once daily.  -Decrease losartan (cozaar) to 25mg  once daily.  -Metoprolol tartrate (lopressor): 75mg  in the morning and 50mg  in the evening.  *If you need a refill on your cardiac medications before your next appointment, please call your pharmacy*   Lab Work: Your physician recommends that you return for lab work in: 3 months for FASTING CMET, CBC, Cholesterol levels  If you have labs (blood work) drawn today and your tests are completely normal, you will receive your results only by: [place] (if you have MyChart) OR A paper copy in the mail If you have any lab test that is abnormal or we need to change your treatment, we will call you to review the results.   Testing/Procedures: Your physician has requested that you have an echocardiogram. Echocardiography is a painless test that uses sound waves to create images of your heart. It provides your doctor with information about the size and shape of your heart and how well your hearts chambers and valves are working. This procedure takes approximately one hour. There are no restrictions for this procedure. To be done in February 2023. This procedure will be done at [address].    Follow-Up: At [facility], you and your health needs are our priority.  As part of our continuing mission to provide you with exceptional heart care, we have created designated Provider Care Teams.  These Care Teams include your primary Cardiologist (physician) and Advanced Practice Providers (APPs -  Physician Assistants and Nurse Practitioners) who all work together to provide you with the care you need, when you need it.  We recommend signing up for the patient portal called "MyChart".  Sign up information is provided on this After Visit Summary.  MyChart is used to connect with patients for Virtual Visits (Telemedicine).  Patients are able to view lab/test results, encounter notes,  upcoming appointments, etc.  Non-urgent messages can be sent to your provider as well.   To learn more about what you can do with MyChart, go to [email or web address].    Your next appointment:   3 month(s)  The format for your next appointment:   In Person  Provider:   Shelva Majestic, MD

## 2021-02-11 NOTE — Progress Notes (Signed)
Cardiology Office Note    Date:  02/22/2021   ID:  Virginia Delgado, DOB Jun 20, 1945, MRN [id number]  PCP:  Glendale Chard, MD  Cardiologist:  Shelva Majestic, MD   Initial evaluation with me   History of Present Illness:  Virginia Delgado is a 75 y.o. female who presents for her initial evaluation with me after she requested a change in her prior provider.  Virginia Delgado is originally from [place], and remotely had seen Dr. Oval Linsey.  In the past she was found to have asymptomatic coronary calcification, hypertension, hyperlipidemia, and is status posttreatment for hepatitis C.  She sees Dr. Baird Cancer for primary care.  She had been on Edarbi and amlodipine for hypertension and remotely metoprolol was switched to carvedilol.  Renal Doppler imaging was negative for stenosis.  She developed progressive chest pain leading to her undergoing cardiac catheterization on April 20, 2020 performed by Dr. Saunders Revel.  This revealed significant three-vessel disease including ostial LAD involvement and definitive CABG revascularization was recommended.  She underwent successful CABG surgery x4 with a LIMA to LAD, RIMA to her PDA, and a sequential radial artery to her OM and ramus intermediate vessel in February 2022 by Dr. Orvan Seen.  She apparently has a history of statin intolerance.    She was evaluated by Sande Rives, PA-C multiple times since her CABG revascularization surgery.  During in April 2022 evaluation she denied any anginal symptoms but noted lightheadedness and dizziness.  A Zio patch monitor showed predominant sinus rhythm and there were 13 short runs of NSVT, the longest being 5 beats and 57 short runs of SVT, the longest being 7 beats as well as some PACs and PVCs.  She was also seen by Coletta Memos, NP in May 2022 and due to intermittent fluttering and elevated blood pressure cardiac rehab she was started on Cardizem CD 120 mg daily in addition to metoprolol.  She was evaluated in the ER for balance issues and  was hypertensive.  Her dizziness was felt to be secondary to hypertension on the brain MRI and head MRA were negative.  She last saw Sande Rives in July 2022.  Presently, she denies any chest pain.  She does admit to some vertigo if she raises her head off the pillow.  She denies any presyncope or syncope.  She currently is on amlodipine 5 mg daily, losartan 50 mg daily, metoprolol tartrate 50 mg twice a day.  She was on my schedule to establish cardiology care with me today.    Past Medical History:  Diagnosis Date   Coronary artery calcification 04/05/2019   Calcium score 93rd percentile 08/2018.   Dermatitis    Hx of hepatitis C    Hypertension     Past Surgical History:  Procedure Laterality Date   BUNIONECTOMY Bilateral    CESAREAN SECTION  1976   CESAREAN SECTION  1979   CLIPPING OF ATRIAL APPENDAGE Left 04/22/2020   Procedure: CLIPPING OF  LEFT ATRIAL APPENDAGE USING ATRICURE ATRICLIPFLEX-V 35;  Surgeon: Wonda Olds, MD;  Location: [facility];  Service: Open Heart Surgery;  Laterality: Left;   CORONARY ARTERY BYPASS GRAFT N/A 04/22/2020   Procedure: CORONARY ARTERY BYPASS GRAFTING (CABGX4). USING BILATERAL MAMMARIES;  Surgeon: Wonda Olds, MD;  Location: [facility];  Service: Open Heart Surgery;  Laterality: N/A;  BIMA   LEFT HEART CATH AND CORONARY ANGIOGRAPHY N/A 04/20/2020   Procedure: LEFT HEART CATH AND CORONARY ANGIOGRAPHY;  Surgeon: Nelva Bush, MD;  Location: [facility];  Service: Cardiovascular;  Laterality: N/A;   RADIAL ARTERY HARVEST Right 04/22/2020   Procedure: RIGHT RADIAL ARTERY HARVESTING.;  Surgeon: Wonda Olds, MD;  Location: [facility];  Service: Open Heart Surgery;  Laterality: Right;   TEE WITHOUT CARDIOVERSION N/A 04/22/2020   Procedure: TRANSESOPHAGEAL ECHOCARDIOGRAM (TEE);  Surgeon: Wonda Olds, MD;  Location: [facility];  Service: Open Heart Surgery;  Laterality: N/A;   UMBILICAL HERNIA REPAIR  1981    Current Medications: Outpatient  Medications Prior to Visit  Medication Sig Dispense Refill   acetaminophen (TYLENOL) 500 MG tablet Take 2 tablets (1,000 mg total) by mouth every 6 (six) hours as needed. (Patient taking differently: Take 1,000 mg by mouth every 6 (six) hours as needed for mild pain.) 30 tablet 0   amLODipine (NORVASC) 5 MG tablet Take 1 tablet (5 mg total) by mouth daily. 180 tablet 3   aspirin 81 MG tablet Take 81 mg by mouth at bedtime.     Cholecalciferol (VITAMIN D) 50 MCG (2000 UT) tablet Take 4,000 Units by mouth daily.     CITRUS BERGAMOT PO Take by mouth.     co-enzyme Q-10 30 MG capsule Take 30 mg by mouth daily.     Multiple Minerals-Vitamins (GNP CAL MAG ZINC +D3 PO) Take 1 tablet by mouth in the morning and at bedtime.     nitroGLYCERIN (NITROSTAT) 0.4 MG SL tablet Place 1 tablet (0.4 mg total) under the tongue every 5 (five) minutes as needed for chest pain. 25 tablet 3   NONI, MORINDA CITRIFOLIA, PO Take 600 mg by mouth 2 (two) times daily.     losartan (COZAAR) 50 MG tablet Take 1 tablet (50 mg total) by mouth daily. 90 tablet 3   metoprolol tartrate (LOPRESSOR) 50 MG tablet Take 1 tablet (50 mg total) by mouth 2 (two) times daily. 180 tablet 1   diclofenac Sodium (VOLTAREN) 1 % GEL diclofenac 1 % topical gel  APPLY 2-4 GRAMS TO AFFECTED JOINT UP TO 4 TIMES DAILY AS NEEDED (Patient not taking: Reported on 02/11/2021)     metoprolol tartrate (LOPRESSOR) 50 MG tablet Take 50 mg by mouth 2 (two) times daily. Take 1 Tablet Twice Daily     No facility-administered medications prior to visit.     Allergies:   Contrast media [iodinated contrast media], Gadobutrol, Gadolinium derivatives, Crestor [rosuvastatin], Lidocaine, Lovastatin, Pravachol [pravastatin sodium], and Latex   Social History   Socioeconomic History   Marital status: Divorced    Spouse name: Not on file   Number of children: Not on file   Years of education: Not on file   Highest education level: Not on file  Occupational  History   Not on file  Tobacco Use   Smoking status: Never   Smokeless tobacco: Never  Vaping Use   Vaping Use: Never used  Substance and Sexual Activity   Alcohol use: Not Currently    Comment: social   Drug use: No   Sexual activity: Yes    Birth control/protection: None  Other Topics Concern   Not on file  Social History Narrative   Not on file   Social Determinants of Health   Financial Resource Strain: Low Risk    Difficulty of Paying Living Expenses: Not hard at all  Food Insecurity: No Food Insecurity   Worried About Charity fundraiser in the Last Year: Never true   Ran Out of Food in the Last Year: Never true  Transportation Needs: No Transportation Needs  Lack of Transportation (Medical): No   Lack of Transportation (Non-Medical): No  Physical Activity: Inactive   Days of Exercise per Week: 0 days   Minutes of Exercise per Session: 0 min  Stress: No Stress Concern Present   Feeling of Stress : Only a little  Social Connections: Not on file    Socially she was born in [place] and lived there for 11 years until she moved to [place].  She also lived in [place] before moving to the [place] area.  Family History:  The patient's family history includes COPD in her mother; Cancer in her mother and sister; Heart disease in her father; Hypertension in her brother and sister; Ovarian cancer in her maternal grandmother; Rheum arthritis in her paternal grandmother.   ROS General: Negative; No fevers, chills, or night sweats;  HEENT: Negative; No changes in vision or hearing, sinus congestion, difficulty swallowing Pulmonary: Negative; No cough, wheezing, shortness of breath, hemoptysis Cardiovascular: See HPI GI: Negative; No nausea, vomiting, diarrhea, or abdominal pain GU: Negative; No dysuria, hematuria, or difficulty voiding Musculoskeletal: Negative; no myalgias, joint pain, or weakness Hematologic/Oncology: Negative; no easy bruising,  bleeding Endocrine: Negative; no heat/cold intolerance; no diabetes Neuro: Negative; no changes in balance, headaches Skin: Negative; No rashes or skin lesions Psychiatric: Negative; No behavioral problems, depression Sleep: Negative; No snoring, daytime sleepiness, hypersomnolence, bruxism, restless legs, hypnogognic hallucinations, no cataplexy Other comprehensive 14 point system review is negative.   PHYSICAL EXAM:   VS:  BP 118/78    Pulse (!) 58    Ht 5' (1.524 m)    Wt 130 lb 3.2 oz (59.1 kg)    SpO2 97%    BMI 25.43 kg/m     Repeat by me was 142/88 supine and 122/84 standing  Wt Readings from Last 3 Encounters:  02/11/21 130 lb 3.2 oz (59.1 kg)  01/14/21 130 lb (59 kg)  11/17/20 129 lb 12.8 oz (58.9 kg)    General: Alert, oriented, no distress.  Skin: normal turgor, no rashes, warm and dry HEENT: Normocephalic, atraumatic. Pupils equal round and reactive to light; sclera anicteric; extraocular muscles intact;  Nose without nasal septal hypertrophy Mouth/Parynx benign; Mallinpatti scale 3 Neck: No JVD, no carotid bruits; normal carotid upstroke Lungs: clear to ausculatation and percussion; no wheezing or rales Chest wall: without tenderness to palpitation Heart: PMI not displaced, trigeminal rhythm, s1 s2 normal, 1/6 systolic murmur, no diastolic murmur, no rubs, gallops, thrills, or heaves Abdomen: soft, nontender; no hepatosplenomehaly, BS+; abdominal aorta nontender and not dilated by palpation. Back: no CVA tenderness Pulses 2+ Musculoskeletal: full range of motion, normal strength, no joint deformities Extremities: no clubbing cyanosis or edema, Homan's sign negative  Neurologic: grossly nonfocal; Cranial nerves grossly wnl Psychologic: Normal mood and affect   Studies/Labs Reviewed:   EKG:  EKG is ordered today.  ECG (independently read by me):  Sinus rhythm at 58 with ventricuar trigeminal rhythm  Recent Labs: BMP Latest Ref Rng & Units 09/04/2020 07/18/2020  07/17/2020  Glucose 65 - 99 mg/dL 106(H) 97 93  BUN 8 - 27 mg/dL $Remove'18 14 12  'GfmJFSA$ Creatinine 0.57 - 1.00 mg/dL 0.91 0.99 1.11(H)  BUN/Creat Ratio 12 - 28 20 - -  Sodium 134 - 144 mmol/L 140 137 139  Potassium 3.5 - 5.2 mmol/L 4.0 3.6 3.9  Chloride 96 - 106 mmol/L 101 105 104  CO2 20 - 29 mmol/L $RemoveB'26 23 26  'fQZnYuLJ$ Calcium 8.7 - 10.3 mg/dL 10.0 10.0 10.0  Hepatic Function Latest Ref Rng & Units 11/17/2020 07/17/2020 04/13/2020  Total Protein 6.0 - 8.5 g/dL 6.9 7.2 7.4  Albumin 3.7 - 4.7 g/dL 4.6 4.0 4.8(H)  AST 0 - 40 IU/L $Remov'17 21 17  'fJGMPa$ ALT 0 - 32 IU/L $Remov'9 14 12  'IqhaIy$ Alk Phosphatase 44 - 121 IU/L 81 70 83  Total Bilirubin 0.0 - 1.2 mg/dL 1.7(H) 1.0 1.1  Bilirubin, Direct 0.00 - 0.40 mg/dL 0.30 - -    CBC Latest Ref Rng & Units 11/17/2020 07/18/2020 07/17/2020  WBC 3.4 - 10.8 x10E3/uL 4.5 6.7 6.6  Hemoglobin 11.1 - 15.9 g/dL 13.1 12.5 12.9  Hematocrit 34.0 - 46.6 % 39.7 38.3 39.8  Platelets 150 - 450 x10E3/uL 183 205 214   Lab Results  Component Value Date   MCV 88 11/17/2020   MCV 89.5 07/18/2020   MCV 90.0 07/17/2020   Lab Results  Component Value Date   TSH 1.090 06/26/2020   Lab Results  Component Value Date   HGBA1C 5.6 11/17/2020     BNP No results found for: BNP  ProBNP No results found for: PROBNP   Lipid Panel     Component Value Date/Time   CHOL 221 (H) 12/30/2020 1044   TRIG 86 12/30/2020 1044   HDL 73 12/30/2020 1044   CHOLHDL 3.0 12/30/2020 1044   LDLCALC 133 (H) 12/30/2020 1044   LABVLDL 15 12/30/2020 1044     RADIOLOGY: No results found.   Additional studies/ records that were reviewed today include:   Conclusions: Severe three-vessel coronary artery disease, including 80-90% ostial/proximal LAD stenosis, 70-90% lesions of ostial/proximal D1, D2, and ramus intermedius, and diffuse mid/distal LCx disease of up to 80% (dominant LCx).  90% stenosis of nondominant RCA is also present. Normal left ventricular contraction with mildly elevated filling pressure.    Recommendations: Given significant three-vessel disease, including ostial LAD involvement, recommend cardiac surgery consultation for CABG.  Given rapid progression of symptoms (often present at rest), patient will be admitted for inpatient surgical evaluation. Aggressive secondary prevention, including addition of statin. Obtain echocardiogram.    7Day Zio Monitor   Quality: Fair.  Baseline artifact. Predominant rhythm: sinus rhythm Average heart rate: 80 bpm Max heart rate: 131 bpm Min heart rate: 53 bpm Pauses >2.5 seconds: none Occasional PACs (3.4%).  Frequent PVCs (15.3%)  Up to 7 beats NSVT.  Maximum heart rate 190 bpm.    ASSESSMENT:    1. Multivessel CAD in native artery   2. S/P CABG (coronary artery bypass graft): April 22, 2020   3. Hyperlipidemia LDL goal <70   4. Primary hypertension   5. Ventricular trigeminy   6. History of NSVT (nonsustained ventricular tachycardia)   7. Vertigo   8. Statin intolerance     PLAN:  Virginia Delgado is a 75 year old female who is originally from [place].  She has a history of hypertension, and developed progressive anginal symptomatology February 2022 leading to her cardiac catheterization.  She was found to have severe multivessel CAD and ultimately underwent CABG revascularization surgery with utilization of arterial conduits including bilateral internal mammary artery and right radial artery with a LIMA to her LAD, RIMA to left PDA, and sequential radial artery to OM and ramus intermediate vessel on April 22, 2020.  Subsequently, she has done well without anginal symptomatology.  She has had issues with palpitations and dizziness.  I reviewed her monitor and subsequent office visits with several of our APP's.  Exam today, she has a trigeminal rhythm.  Her blood pressure was 142/88 supine and this dropped to 122/84 with standing by me.  She has had issues with vertigo particularly when she raises her head off the pillow.  Her  orthostatic symptoms, I have recommended she reduce her losartan from 50 mg down to 25 mg daily.  With her trigeminal ventricular rhythm I am slightly titrating metoprolol succinate to 75 mg in the morning and 50 mg in the evening.  I have also suggested a possible ENT evaluation for her vertigo.  She has a history of statin intolerance and will initiate Zetia 10 mg.  She is a candidate for institution of [facility] and I will contact pharmacy for approval and institution.  Her most recent lipid study was elevated with an LDL of 133 on December 30, 2020.  We will recheck an echo Doppler study in 3 months at which time she will also undergo comprehensive metabolic panel CBC and lipid studies and follow-up evaluation.   Medication Adjustments/Labs and Tests Ordered: Current medicines are reviewed at length with the patient today.  Concerns regarding medicines are outlined above.  Medication changes, Labs and Tests ordered today are listed in the Patient Instructions below. Patient Instructions  Medication Instructions:   -Start ezetimibe (zetia) 10mg  once daily.  -Decrease losartan (cozaar) to 25mg  once daily.  -Metoprolol tartrate (lopressor): 75mg  in the morning and 50mg  in the evening.  *If you need a refill on your cardiac medications before your next appointment, please call your pharmacy*   Lab Work: Your physician recommends that you return for lab work in: 3 months for FASTING CMET, CBC, Cholesterol levels  If you have labs (blood work) drawn today and your tests are completely normal, you will receive your results only by: [facility] (if you have MyChart) OR A paper copy in the mail If you have any lab test that is abnormal or we need to change your treatment, we will call you to review the results.   Testing/Procedures: Your physician has requested that you have an echocardiogram. Echocardiography is a painless test that uses sound waves to create images of your heart. It provides  your doctor with information about the size and shape of your heart and how well your hearts chambers and valves are working. This procedure takes approximately one hour. There are no restrictions for this procedure. To be done in February 2023. This procedure will be done at [address].    Follow-Up: At [facility], you and your health needs are our priority.  As part of our continuing mission to provide you with exceptional heart care, we have created designated Provider Care Teams.  These Care Teams include your primary Cardiologist (physician) and Advanced Practice Providers (APPs -  Physician Assistants and Nurse Practitioners) who all work together to provide you with the care you need, when you need it.  We recommend signing up for the patient portal called "MyChart".  Sign up information is provided on this After Visit Summary.  MyChart is used to connect with patients for Virtual Visits (Telemedicine).  Patients are able to view lab/test results, encounter notes, upcoming appointments, etc.  Non-urgent messages can be sent to your provider as well.   To learn more about what you can do with MyChart, go to [email or web address].    Your next appointment:   3 month(s)  The format for your next appointment:   In Person  Provider:   Shelva Majestic, MD   Signed, Shelva Majestic, MD  02/22/2021 11:50  [facility] [address], [address], [place], [place]  [address] Phone: [phone]

## 2021-02-16 ENCOUNTER — Telehealth: Payer: Medicare HMO

## 2021-02-16 ENCOUNTER — Ambulatory Visit (INDEPENDENT_AMBULATORY_CARE_PROVIDER_SITE_OTHER): Payer: Medicare HMO

## 2021-02-16 DIAGNOSIS — I2510 Atherosclerotic heart disease of native coronary artery without angina pectoris: Secondary | ICD-10-CM

## 2021-02-16 DIAGNOSIS — E785 Hyperlipidemia, unspecified: Secondary | ICD-10-CM

## 2021-02-16 DIAGNOSIS — I1310 Hypertensive heart and chronic kidney disease without heart failure, with stage 1 through stage 4 chronic kidney disease, or unspecified chronic kidney disease: Secondary | ICD-10-CM

## 2021-02-17 NOTE — Chronic Care Management (AMB) (Signed)
Chronic Care Management   CCM RN Visit Note  02/16/2021 Name: Virginia Delgado MRN: [id number] DOB: 1945/09/04  Subjective: Virginia Delgado is a 75 y.o. year old female who is a primary care patient of Glendale Chard, MD. The care management team was consulted for assistance with disease management and care coordination needs.    Engaged with patient by telephone for follow up visit in response to provider referral for case management and/or care coordination services.   Consent to Services:  The patient was given information about Chronic Care Management services, agreed to services, and gave verbal consent prior to initiation of services.  Please see initial visit note for detailed documentation.   Patient agreed to services and verbal consent obtained.   Assessment: Review of patient past medical history, allergies, medications, health status, including review of consultants reports, laboratory and other test data, was performed as part of comprehensive evaluation and provision of chronic care management services.   SDOH (Social Determinants of Health) assessments and interventions performed:  Yes, no acute challenges   CCM Care Plan  Allergies  Allergen Reactions   Contrast Media [Iodinated Diagnostic Agents] Swelling    Of face, especially eyes   Gadobutrol Other (See Comments)    Felt extremities go numb briefly as contrast injected.  Had cough (bronchospasm?) briefly after scan.  Resolved on own without treatment.  07/13/15   Gadolinium Derivatives Other (See Comments)    Felt extremities go numb briefly as contrast injected.  Had cough (bronchospasm?) briefly after scan.  Resolved on own without treatment.  07/13/15   Crestor [Rosuvastatin]     myalgias   Lidocaine Other (See Comments)    Facial swelling after dental procedure   Lovastatin     myalgias   Pravachol [Pravastatin Sodium]    Latex Rash and Other (See Comments)    and irritation    Outpatient Encounter Medications as  of 02/16/2021  Medication Sig   acetaminophen (TYLENOL) 500 MG tablet Take 2 tablets (1,000 mg total) by mouth every 6 (six) hours as needed. (Patient taking differently: Take 1,000 mg by mouth every 6 (six) hours as needed for mild pain.)   Alirocumab (PRALUENT) 150 MG/ML SOAJ Inject 150 mg into the skin every 14 (fourteen) days.   aspirin 81 MG tablet Take 81 mg by mouth at bedtime.   Cholecalciferol (VITAMIN D) 50 MCG (2000 UT) tablet Take 4,000 Units by mouth daily.   CITRUS BERGAMOT PO Take by mouth.   co-enzyme Q-10 30 MG capsule Take 30 mg by mouth daily.   diclofenac Sodium (VOLTAREN) 1 % GEL diclofenac 1 % topical gel  APPLY 2-4 GRAMS TO AFFECTED JOINT UP TO 4 TIMES DAILY AS NEEDED (Patient not taking: Reported on 02/11/2021)   ezetimibe (ZETIA) 10 MG tablet Take 1 tablet (10 mg total) by mouth daily.   losartan (COZAAR) 25 MG tablet Take 1 tablet (25 mg total) by mouth daily.   metoprolol tartrate (LOPRESSOR) 50 MG tablet Take 1.5 tablets (75 mg total) by mouth in the morning AND 1 tablet (50 mg total) every evening.   Multiple Minerals-Vitamins (GNP CAL MAG ZINC +D3 PO) Take 1 tablet by mouth in the morning and at bedtime.   NONI, MORINDA CITRIFOLIA, PO Take 600 mg by mouth 2 (two) times daily.   No facility-administered encounter medications on file as of 02/16/2021.    Patient Active Problem List   Diagnosis Date Noted   Acute kidney injury superimposed on chronic kidney disease ([place]) 07/18/2020  Hypertensive urgency 07/17/2020   S/P CABG x 4 04/26/2020   Polyarthritis with positive rheumatoid factor ([place]) 02/10/2020   Statin intolerance 11/26/2019   CAD (coronary artery disease) 04/05/2019   Hypertensive heart and renal disease 03/12/2018   Chronic renal disease, stage II 03/12/2018   Estrogen deficiency 03/12/2018   Hyperlipidemia LDL goal <70 10/20/2017   Menopause 07/19/2011   Essential hypertension    Hepatitis C     Conditions to be addressed/monitored:  Hypertensive heart and renal disease with renal failure, stage 1 through stage 4 or unspecified chronic kidney disease, without heart failure; Coronary artery disease due to lipid rich plaque, Hyperlipidemia   Care Plan : [place] of Care  Updates made by Lynne Logan, RN since 02/16/2021 12:00 AM     Problem: Chronic disease education and Care Coordination needs for Hypertensive heart and renal disease with renal failure, stage 1 through stage 4 or unspecified chronic kidney disease, without heart failure; Coronary artery disease due to lipid rich plaque   Priority: High     Long-Range Goal: Assist with Chronic disease education and Care Coordination needs for Hypertensive heart and renal disease with renal failure, stage 1 through stage 4 or unspecified chronic kidney disease; Coronary artery disease due to lipid rich plaque   Start Date: 12/22/2020  Expected End Date: 12/22/2021  Recent Progress: On track  Priority: High  Note:   Current Barriers:  Knowledge Deficits related to plan of care for management of Hypertensive heart and renal disease with renal failure, stage 1 through stage 4 or unspecified chronic kidney disease, without heart failure; Coronary artery disease due to lipid rich plaque Chronic Disease Management support and education needs related to Hypertensive heart and renal disease with renal failure, stage 1 through stage 4 or unspecified chronic kidney disease, without heart failure; Coronary artery disease due to lipid rich plaque  RNCM Clinical Goal(s):  Patient will demonstrate Ongoing health management independence   continue to work with RN Care Manager to address care management and care coordination needs related to  Hypertensive heart and renal disease with renal failure, stage 1 through stage 4 or unspecified chronic kidney disease, without heart failure; Coronary artery disease due to lipid rich plaque will demonstrate ongoing self health care  management ability    through collaboration with RN Care manager, provider, and care team.   Interventions: 1:1 collaboration with primary care provider regarding development and update of comprehensive plan of care as evidenced by provider attestation and co-signature Inter-disciplinary care team collaboration (see longitudinal plan of care) Evaluation of current treatment plan related to  self management and patient's adherence to plan as established by provider  Hypertension Interventions:  (Status:  Goal on track:  Yes.) Long Term Goal Last practice recorded BP readings:  BP Readings from Last 3 Encounters:  02/11/21 118/78  11/17/20 132/78  10/28/20 (!) 148/84  Most recent eGFR/CrCl:  Lab Results  Component Value Date   EGFR 67 09/04/2020    No components found for: CRCL Determined patient completed a Cardiology follow up with Dr. Claiborne Billings on 02/11/21 Review of patient status, including review of consultant's reports, relevant laboratory and other test results, and medications completed. Reviewed medications with patient and discussed importance of medication adherence Medication Instructions:  -Start ezetimibe (zetia) 11m once daily. -Decrease losartan (cozaar) to 257monce daily. -Metoprolol tartrate (lopressor): 7528mn the morning and 69m57m the evening. Advised patient, providing education and rationale, to monitor blood pressure daily and  record, calling PCP for findings outside established parameters Provided education on prescribed diet low Sodium diet  Discussed plans with patient for ongoing care management follow up and provided patient with direct contact information for care management team  CAD Interventions: (Status:  Goal on track:  Yes.) Long Term Goal Assessed understanding of CAD diagnosis Medications reviewed including medications utilized in CAD treatment plan including recent start of ezetimibe (zetia) 10 mg once daily Reviewed importance of taking  medications exactly as prescribed  Provided education on importance of blood pressure control in management of CAD Counseled on importance of regular laboratory monitoring as prescribed Discussed plans with patient for ongoing care management follow up and provided patient with direct contact information for care management team Lipid Panel     Component Value Date/Time   CHOL 221 (H) 12/30/2020 1044   TRIG 86 12/30/2020 1044   HDL 73 12/30/2020 1044   CHOLHDL 3.0 12/30/2020 1044   LDLCALC 133 (H) 12/30/2020 1044   LABVLDL 15 12/30/2020 1044    Patient Goals/Self-Care Activities: Take all medications as prescribed Attend all scheduled provider appointments Call pharmacy for medication refills 3-7 days in advance of running out of medications Attend church or other social activities Perform all self care activities independently  Perform IADL's (shopping, preparing meals, housekeeping, managing finances) independently Call provider office for new concerns or questions  keep a blood pressure log take blood pressure log to all doctor appointments call doctor for signs and symptoms of high blood pressure take medications for blood pressure exactly as prescribed report new symptoms to your doctor adhere to prescribed diet: low Sodium develop an exercise routine  Follow Up Plan:  Telephone follow up appointment with care management team member scheduled for:  05/24/21      Plan:Telephone follow up appointment with care management team member scheduled for:  05/24/21  Barb Merino, RN, BSN, CCM Care Management Coordinator [place] [facility]  Direct Phone: [phone]

## 2021-02-17 NOTE — Patient Instructions (Signed)
Visit Information   Thank you for taking time to visit with me today. Please don't hesitate to contact me if I can be of assistance to you before our next scheduled telephone appointment.  Following are the goals we discussed today:  (Copy and paste patient goals from clinical care plan here)  Our next appointment is by telephone on 05/24/21 at 11:05 AM  Please call the care guide team at [phone] if you need to cancel or reschedule your appointment.   If you are experiencing a Mental Health or [place] or need someone to talk to, please call 1-800-273-TALK (toll free, 24 hour hotline)   Following is a copy of your full care plan:  Care Plan : [place] of Care  Updates made by Lynne Logan, RN since 02/16/2021 12:00 AM     Problem: Chronic disease education and Care Coordination needs for Hypertensive heart and renal disease with renal failure, stage 1 through stage 4 or unspecified chronic kidney disease, without heart failure; Coronary artery disease due to lipid rich plaque   Priority: High     Long-Range Goal: Assist with Chronic disease education and Care Coordination needs for Hypertensive heart and renal disease with renal failure, stage 1 through stage 4 or unspecified chronic kidney disease; Coronary artery disease due to lipid rich plaque   Start Date: 12/22/2020  Expected End Date: 12/22/2021  Recent Progress: On track  Priority: High  Note:   Current Barriers:  Knowledge Deficits related to plan of care for management of Hypertensive heart and renal disease with renal failure, stage 1 through stage 4 or unspecified chronic kidney disease, without heart failure; Coronary artery disease due to lipid rich plaque Chronic Disease Management support and education needs related to Hypertensive heart and renal disease with renal failure, stage 1 through stage 4 or unspecified chronic kidney disease, without heart failure; Coronary artery disease due  to lipid rich plaque  RNCM Clinical Goal(s):  Patient will demonstrate Ongoing health management independence   continue to work with RN Care Manager to address care management and care coordination needs related to  Hypertensive heart and renal disease with renal failure, stage 1 through stage 4 or unspecified chronic kidney disease, without heart failure; Coronary artery disease due to lipid rich plaque will demonstrate ongoing self health care management ability    through collaboration with RN Care manager, provider, and care team.   Interventions: 1:1 collaboration with primary care provider regarding development and update of comprehensive plan of care as evidenced by provider attestation and co-signature Inter-disciplinary care team collaboration (see longitudinal plan of care) Evaluation of current treatment plan related to  self management and patient's adherence to plan as established by provider  Hypertension Interventions:  (Status:  Goal on track:  Yes.) Long Term Goal Last practice recorded BP readings:  BP Readings from Last 3 Encounters:  02/11/21 118/78  11/17/20 132/78  10/28/20 (!) 148/84  Most recent eGFR/CrCl:  Lab Results  Component Value Date   EGFR 67 09/04/2020    No components found for: CRCL Determined patient completed a Cardiology follow up with Dr. Claiborne Billings on 02/11/21 Review of patient status, including review of consultant's reports, relevant laboratory and other test results, and medications completed. Reviewed medications with patient and discussed importance of medication adherence Medication Instructions:  -Start ezetimibe (zetia) 60m once daily. -Decrease losartan (cozaar) to 221monce daily. -Metoprolol tartrate (lopressor): 7551mn the morning and 85m62m the evening. Advised patient, providing  education and rationale, to monitor blood pressure daily and record, calling PCP for findings outside established parameters Provided education on prescribed  diet low Sodium diet  Discussed plans with patient for ongoing care management follow up and provided patient with direct contact information for care management team  CAD Interventions: (Status:  Goal on track:  Yes.) Long Term Goal Assessed understanding of CAD diagnosis Medications reviewed including medications utilized in CAD treatment plan including recent start of ezetimibe (zetia) 10 mg once daily Reviewed importance of taking medications exactly as prescribed  Provided education on importance of blood pressure control in management of CAD Counseled on importance of regular laboratory monitoring as prescribed Discussed plans with patient for ongoing care management follow up and provided patient with direct contact information for care management team Lipid Panel     Component Value Date/Time   CHOL 221 (H) 12/30/2020 1044   TRIG 86 12/30/2020 1044   HDL 73 12/30/2020 1044   CHOLHDL 3.0 12/30/2020 1044   LDLCALC 133 (H) 12/30/2020 1044   LABVLDL 15 12/30/2020 1044    Patient Goals/Self-Care Activities: Take all medications as prescribed Attend all scheduled provider appointments Call pharmacy for medication refills 3-7 days in advance of running out of medications Attend church or other social activities Perform all self care activities independently  Perform IADL's (shopping, preparing meals, housekeeping, managing finances) independently Call provider office for new concerns or questions  keep a blood pressure log take blood pressure log to all doctor appointments call doctor for signs and symptoms of high blood pressure take medications for blood pressure exactly as prescribed report new symptoms to your doctor adhere to prescribed diet: low Sodium develop an exercise routine  Follow Up Plan:  Telephone follow up appointment with care management team member scheduled for:  05/24/21      Consent to CCM Services: Ms. Dishner was given information about Chronic Care  Management services including:  CCM service includes personalized support from designated clinical staff supervised by her physician, including individualized plan of care and coordination with other care providers 24/7 contact phone numbers for assistance for urgent and routine care needs. Service will only be billed when office clinical staff spend 20 minutes or more in a month to coordinate care. Only one practitioner may furnish and bill the service in a calendar month. The patient may stop CCM services at any time (effective at the end of the month) by phone call to the office staff. The patient will be responsible for cost sharing (co-pay) of up to 20% of the service fee (after annual deductible is met).  Patient agreed to services and verbal consent obtained.   Patient verbalizes understanding of instructions provided today and agrees to view in [place].   Telephone follow up appointment with care management team member scheduled for: 05/24/21

## 2021-02-22 ENCOUNTER — Encounter: Payer: Self-pay | Admitting: Cardiovascular Disease

## 2021-02-27 DIAGNOSIS — I1310 Hypertensive heart and chronic kidney disease without heart failure, with stage 1 through stage 4 chronic kidney disease, or unspecified chronic kidney disease: Secondary | ICD-10-CM

## 2021-02-27 DIAGNOSIS — E785 Hyperlipidemia, unspecified: Secondary | ICD-10-CM | POA: Diagnosis not present

## 2021-02-27 DIAGNOSIS — I2583 Coronary atherosclerosis due to lipid rich plaque: Secondary | ICD-10-CM | POA: Diagnosis not present

## 2021-02-27 DIAGNOSIS — I2510 Atherosclerotic heart disease of native coronary artery without angina pectoris: Secondary | ICD-10-CM | POA: Diagnosis not present

## 2021-03-10 DIAGNOSIS — Z0100 Encounter for examination of eyes and vision without abnormal findings: Secondary | ICD-10-CM | POA: Diagnosis not present

## 2021-03-10 DIAGNOSIS — H5213 Myopia, bilateral: Secondary | ICD-10-CM | POA: Diagnosis not present

## 2021-03-15 ENCOUNTER — Other Ambulatory Visit: Payer: Self-pay

## 2021-03-15 ENCOUNTER — Ambulatory Visit (HOSPITAL_COMMUNITY): Payer: Medicare HMO | Attending: Cardiology

## 2021-03-15 DIAGNOSIS — N189 Chronic kidney disease, unspecified: Secondary | ICD-10-CM | POA: Insufficient documentation

## 2021-03-15 DIAGNOSIS — E785 Hyperlipidemia, unspecified: Secondary | ICD-10-CM | POA: Diagnosis not present

## 2021-03-15 DIAGNOSIS — I351 Nonrheumatic aortic (valve) insufficiency: Secondary | ICD-10-CM | POA: Insufficient documentation

## 2021-03-15 DIAGNOSIS — I129 Hypertensive chronic kidney disease with stage 1 through stage 4 chronic kidney disease, or unspecified chronic kidney disease: Secondary | ICD-10-CM | POA: Insufficient documentation

## 2021-03-15 DIAGNOSIS — I2510 Atherosclerotic heart disease of native coronary artery without angina pectoris: Secondary | ICD-10-CM | POA: Diagnosis not present

## 2021-03-15 DIAGNOSIS — I082 Rheumatic disorders of both aortic and tricuspid valves: Secondary | ICD-10-CM | POA: Diagnosis not present

## 2021-03-15 DIAGNOSIS — Z951 Presence of aortocoronary bypass graft: Secondary | ICD-10-CM | POA: Insufficient documentation

## 2021-03-15 DIAGNOSIS — I517 Cardiomegaly: Secondary | ICD-10-CM | POA: Diagnosis not present

## 2021-03-15 LAB — ECHOCARDIOGRAM COMPLETE
Area-P 1/2: 4.36 cm2
P 1/2 time: 1029 msec
S' Lateral: 2.2 cm

## 2021-05-12 DIAGNOSIS — I2510 Atherosclerotic heart disease of native coronary artery without angina pectoris: Secondary | ICD-10-CM | POA: Diagnosis not present

## 2021-05-12 DIAGNOSIS — I2583 Coronary atherosclerosis due to lipid rich plaque: Secondary | ICD-10-CM | POA: Diagnosis not present

## 2021-05-12 DIAGNOSIS — I10 Essential (primary) hypertension: Secondary | ICD-10-CM | POA: Diagnosis not present

## 2021-05-12 DIAGNOSIS — E785 Hyperlipidemia, unspecified: Secondary | ICD-10-CM | POA: Diagnosis not present

## 2021-05-13 ENCOUNTER — Other Ambulatory Visit: Payer: Self-pay | Admitting: Cardiovascular Disease

## 2021-05-13 LAB — COMPREHENSIVE METABOLIC PANEL
ALT: 11 IU/L (ref 0–32)
AST: 16 IU/L (ref 0–40)
Albumin/Globulin Ratio: 1.6 (ref 1.2–2.2)
Albumin: 4.2 g/dL (ref 3.7–4.7)
Alkaline Phosphatase: 85 IU/L (ref 44–121)
BUN/Creatinine Ratio: 10 — ABNORMAL LOW (ref 12–28)
BUN: 11 mg/dL (ref 8–27)
Bilirubin Total: 1.6 mg/dL — ABNORMAL HIGH (ref 0.0–1.2)
CO2: 23 mmol/L (ref 20–29)
Calcium: 10 mg/dL (ref 8.7–10.3)
Chloride: 104 mmol/L (ref 96–106)
Creatinine, Ser: 1.09 mg/dL — ABNORMAL HIGH (ref 0.57–1.00)
Globulin, Total: 2.6 g/dL (ref 1.5–4.5)
Glucose: 84 mg/dL (ref 70–99)
Potassium: 4 mmol/L (ref 3.5–5.2)
Sodium: 142 mmol/L (ref 134–144)
Total Protein: 6.8 g/dL (ref 6.0–8.5)
eGFR: 54 mL/min/{1.73_m2} — ABNORMAL LOW (ref >59)

## 2021-05-13 LAB — CBC
Hematocrit: 37.8 % (ref 34.0–46.6)
Hemoglobin: 13.2 g/dL (ref 11.1–15.9)
MCH: 30.9 pg (ref 26.6–33.0)
MCHC: 34.9 g/dL (ref 31.5–35.7)
MCV: 89 fL (ref 79–97)
Platelets: 201 10*3/uL (ref 150–450)
RBC: 4.27 x10E6/uL (ref 3.77–5.28)
RDW: 12.1 % (ref 11.7–15.4)
WBC: 6.1 10*3/uL (ref 3.4–10.8)

## 2021-05-13 LAB — LIPID PANEL
Chol/HDL Ratio: 1.9 ratio (ref 0.0–4.4)
Cholesterol, Total: 146 mg/dL (ref 100–199)
HDL: 77 mg/dL (ref >39)
LDL Chol Calc (NIH): 55 mg/dL (ref 0–99)
Triglycerides: 74 mg/dL (ref 0–149)
VLDL Cholesterol Cal: 14 mg/dL (ref 5–40)

## 2021-05-17 ENCOUNTER — Ambulatory Visit: Payer: Medicare HMO | Admitting: Internal Medicine

## 2021-05-21 ENCOUNTER — Other Ambulatory Visit: Payer: Self-pay

## 2021-05-21 ENCOUNTER — Ambulatory Visit: Payer: Medicare HMO | Admitting: Cardiovascular Disease

## 2021-05-21 ENCOUNTER — Encounter: Payer: Self-pay | Admitting: Cardiovascular Disease

## 2021-05-21 DIAGNOSIS — I4729 Other ventricular tachycardia: Secondary | ICD-10-CM | POA: Diagnosis not present

## 2021-05-21 DIAGNOSIS — R413 Other amnesia: Secondary | ICD-10-CM

## 2021-05-21 DIAGNOSIS — R002 Palpitations: Secondary | ICD-10-CM | POA: Diagnosis not present

## 2021-05-21 DIAGNOSIS — R4189 Other symptoms and signs involving cognitive functions and awareness: Secondary | ICD-10-CM | POA: Diagnosis not present

## 2021-05-21 DIAGNOSIS — Z951 Presence of aortocoronary bypass graft: Secondary | ICD-10-CM

## 2021-05-21 DIAGNOSIS — I2510 Atherosclerotic heart disease of native coronary artery without angina pectoris: Secondary | ICD-10-CM | POA: Diagnosis not present

## 2021-05-21 DIAGNOSIS — E785 Hyperlipidemia, unspecified: Secondary | ICD-10-CM

## 2021-05-21 MED ORDER — METOPROLOL SUCCINATE ER 50 MG PO TB24: 50.0000 mg | ORAL_TABLET | Freq: Every day | ORAL | 6 refills | Status: DC

## 2021-05-21 NOTE — Progress Notes (Signed)
? ?Cardiology Office Note   ? ?Date:  05/31/2021  ? ?ID:  Virginia Delgado, DOB 1945-11-10, MRN [id number] ? ?PCP:  Glendale Chard, MD  ?Cardiologist:  Shelva Majestic, MD  ? ?44-monthfollow-up evaluation ? ? ?History of Present Illness:  ?Virginia Delgado is a 76y.o. female who presents for her initial evaluation with me after she requested a change in her prior provider. ? ?Virginia Delgado is originally from [place] and remotely had seen Dr. ROval Linsey  In the past she was found to have asymptomatic coronary calcification, hypertension, hyperlipidemia, and is status posttreatment for hepatitis C.  She sees Dr. SBaird Cancerfor primary care.  She had been on Edarbi and amlodipine for hypertension and remotely metoprolol was switched to carvedilol.  Renal Doppler imaging was negative for stenosis. ? ?She developed progressive chest pain leading to her undergoing cardiac catheterization on April 20, 2020 performed by Dr. ESaunders Revel  This revealed significant three-vessel disease including ostial LAD involvement and definitive CABG revascularization was recommended.  She underwent successful CABG surgery x4 with a LIMA to LAD, RIMA to her PDA, and a sequential radial artery to her OM and ramus intermediate vessel in February 2022 by Dr. AOrvan Seen  She apparently has a history of statin intolerance.   ? ?She was evaluated by CSande Rives PA-C multiple times since her CABG revascularization surgery.  During in April 2022 evaluation she denied any anginal symptoms but noted lightheadedness and dizziness.  A Zio patch monitor showed predominant sinus rhythm and there were 13 short runs of NSVT, the longest being 5 beats and 57 short runs of SVT, the longest being 7 beats as well as some PACs and PVCs.  She was also seen by JColetta Memos NP in May 2022 and due to intermittent fluttering and elevated blood pressure cardiac rehab she was started on Cardizem CD 120 mg daily in addition to metoprolol.  She was evaluated in the ER for balance issues and  was hypertensive.  Her dizziness was felt to be secondary to hypertension on the brain MRI and head MRA were negative.  She last saw CSande Rivesin July 2022. ? ?I saw her for my initial evaluation with me on February 11, 2021 at which time she denied any chest pain.  At times she admitted to some vertigo if she raises her head off the pillow.  She denies any presyncope or syncope.  She was on amlodipine 5 mg daily, losartan 50 mg daily, metoprolol tartrate 50 mg twice a day.  During that evaluation, she admitted to occasional palpitations and she had a trigeminal rhythm.  She had mild orthostatic symptoms I recommended she reduce losartan from 50 mg down to 25 mg and with her trigeminal rhythm suggested titration of metoprolol tartrate to 75 mg in the morning and 50 mg in the evening.  Also suggested a possible ENT evaluation for vertigo.  With her statin intolerance I initiated Zetia.   ? ?She underwent an echo Doppler study on March 15, 2021 which showed normal LV function with EF 55 to 60%, mild LVH and normal diastolic parameters.  She had mildly increased pulmonary artery systolic pressure 395.6mm.  There was mild aortic sclerosis without stenosis with mild MR. ? ?Her, she states that she was evaluated by ENT and was told things were fine.  Apparently she has been started on Praluent for Gress of lipid management particular with her statin intolerance.  She continues to be on Zetia.  [place].  She is unaware of recent palpitations.  She has noticed memory issues and cognitive changes.  She presents for follow-up evaluation ? ? ?Past Medical History:  ?Diagnosis Date  ? Coronary artery calcification 04/05/2019  ? Calcium score 93rd percentile 08/2018.  ? Dermatitis   ? Hx of hepatitis C   ? Hypertension   ? ? ?Past Surgical History:  ?Procedure Laterality Date  ? BUNIONECTOMY Bilateral   ? CESAREAN SECTION  1976  ? [place]  ? CLIPPING OF ATRIAL  APPENDAGE Left 04/22/2020  ? Procedure: CLIPPING OF  LEFT ATRIAL APPENDAGE USING ATRICURE ATRICLIPFLEX-V 35;  Surgeon: Wonda Olds, MD;  Location: [facility];  Service: Open Heart Surgery;  Laterality: Left;  ? CORONARY ARTERY BYPASS GRAFT N/A 04/22/2020  ? Procedure: CORONARY ARTERY BYPASS GRAFTING ([place]). USING BILATERAL MAMMARIES;  Surgeon: Wonda Olds, MD;  Location: [facility];  Service: Open Heart Surgery;  Laterality: N/A;  BIMA  ? LEFT HEART CATH AND CORONARY ANGIOGRAPHY N/A 04/20/2020  ? Procedure: LEFT HEART CATH AND CORONARY ANGIOGRAPHY;  Surgeon: Nelva Bush, MD;  Location: [facility];  Service: Cardiovascular;  Laterality: N/A;  ? RADIAL ARTERY HARVEST Right 04/22/2020  ? Procedure: RIGHT RADIAL ARTERY HARVESTING.;  Surgeon: Wonda Olds, MD;  Location: [facility];  Service: Open Heart Surgery;  Laterality: Right;  ? TEE WITHOUT CARDIOVERSION N/A 04/22/2020  ? Procedure: TRANSESOPHAGEAL ECHOCARDIOGRAM (TEE);  Surgeon: Wonda Olds, MD;  Location: [facility];  Service: Open Heart Surgery;  Laterality: N/A;  ? [place]  ? ? ?Current Medications: ?Outpatient Medications Prior to Visit  ?Medication Sig Dispense Refill  ? acetaminophen (TYLENOL) 500 MG tablet Take 2 tablets (1,000 mg total) by mouth every 6 (six) hours as needed. (Patient taking differently: Take 1,000 mg by mouth every 6 (six) hours as needed for mild pain.) 30 tablet 0  ? aspirin 81 MG tablet Take 81 mg by mouth at bedtime.    ? Cholecalciferol (VITAMIN D) 50 MCG (2000 UT) tablet Take 4,000 Units by mouth daily.    ? CITRUS BERGAMOT PO Take by mouth.    ? co-enzyme Q-10 30 MG capsule Take 30 mg by mouth daily.    ? ezetimibe (ZETIA) 10 MG tablet Take 1 tablet (10 mg total) by mouth daily. 90 tablet 3  ? losartan (COZAAR) 25 MG tablet Take 1 tablet (25 mg total) by mouth daily. 90 tablet 3  ? Multiple Minerals-Vitamins (GNP CAL MAG ZINC +D3 PO) Take 1 tablet by mouth in the morning and at bedtime.    ? NONI,  MORINDA CITRIFOLIA, PO Take 600 mg by mouth 2 (two) times daily.    ? PRALUENT 150 MG/ML SOAJ INJECT 150 MG INTO THE SKIN EVERY 14 (FOURTEEN) DAYS. 6 mL 3  ? metoprolol tartrate (LOPRESSOR) 50 MG tablet Take 1.5 tablets (75 mg total) by mouth in the morning AND 1 tablet (50 mg total) every evening. 225 tablet 1  ? amLODipine (NORVASC) 5 MG tablet Take 1 tablet (5 mg total) by mouth daily. 180 tablet 3  ? nitroGLYCERIN (NITROSTAT) 0.4 MG SL tablet Place 1 tablet (0.4 mg total) under the tongue every 5 (five) minutes as needed for chest pain. 25 tablet 3  ? diclofenac Sodium (VOLTAREN) 1 % GEL diclofenac 1 % topical gel ? APPLY 2-4 GRAMS TO AFFECTED JOINT UP TO 4 TIMES DAILY AS NEEDED (Patient not taking: Reported on 02/11/2021)    ? ?  No facility-administered medications prior to visit.  ?  ? ?Allergies:   Contrast media [iodinated contrast media], Gadobutrol, Gadolinium derivatives, Lidocaine, Lovastatin, Pravachol [pravastatin sodium], and Latex  ? ?Social History  ? ?Socioeconomic History  ? Marital status: Divorced  ?  Spouse name: Not on file  ? Number of children: Not on file  ? Years of education: Not on file  ? Highest education level: Not on file  ?Occupational History  ? Not on file  ?Tobacco Use  ? Smoking status: Never  ? Smokeless tobacco: Never  ?Vaping Use  ? Vaping Use: Never used  ?Substance and Sexual Activity  ? Alcohol use: Not Currently  ?  Comment: social  ? Drug use: No  ? Sexual activity: Yes  ?  Birth control/protection: None  ?Other Topics Concern  ? Not on file  ?Social History Narrative  ? Not on file  ? ?Social Determinants of Health  ? ?Financial Resource Strain: Low Risk   ? Difficulty of Paying Living Expenses: Not hard at all  ?Food Insecurity: No Food Insecurity  ? Worried About Charity fundraiser in the Last Year: Never true  ? Ran Out of Food in the Last Year: Never true  ?Transportation Needs: No Transportation Needs  ? Lack of Transportation (Medical): No  ? Lack of  Transportation (Non-Medical): No  ?Physical Activity: Inactive  ? Days of Exercise per Week: 0 days  ? Minutes of Exercise per Session: 0 min  ?Stress: No Stress Concern Present  ? Feeling of Stress : Only a little  ?So

## 2021-05-21 NOTE — Patient Instructions (Signed)
Medication Instructions:  ?START METOPROLOL SUCCINATE '50MG'$  ONCE DAILY ? ?*If you need a refill on your cardiac medications before your next appointment, please call your pharmacy* ? ?Lab Work:   Testing/Procedures:  ?NONE    NONE ? ?Special Instructions ?REFERRAL TO NEUROLOGY-SOMEONE WLL BE CALLING YOU TO SCHEDULE THIS APPT ? ?Follow-Up: ?Your next appointment:  4-6 month(s) In Person with Skeet Latch, MD  ? ?Please call our office 2 months in advance to schedule this appointment  ? ?At [facility], you and your health needs are our priority.  As part of our continuing mission to provide you with exceptional heart care, we have created designated Provider Care Teams.  These Care Teams include your primary Cardiologist (physician) and Advanced Practice Providers (APPs -  Physician Assistants and Nurse Practitioners) who all work together to provide you with the care you need, when you need it. ? ?We recommend signing up for the patient portal called "MyChart".  Sign up information is provided on this After Visit Summary.  MyChart is used to connect with patients for Virtual Visits (Telemedicine).  Patients are able to view lab/test results, encounter notes, upcoming appointments, etc.  Non-urgent messages can be sent to your provider as well.   ?To learn more about what you can do with MyChart, go to [email or web address].   ? ? ?

## 2021-05-24 ENCOUNTER — Encounter: Payer: Self-pay | Admitting: Internal Medicine

## 2021-05-24 ENCOUNTER — Telehealth: Payer: Medicare HMO

## 2021-05-24 ENCOUNTER — Ambulatory Visit (INDEPENDENT_AMBULATORY_CARE_PROVIDER_SITE_OTHER): Payer: Medicare HMO

## 2021-05-24 DIAGNOSIS — I2510 Atherosclerotic heart disease of native coronary artery without angina pectoris: Secondary | ICD-10-CM

## 2021-05-24 DIAGNOSIS — E785 Hyperlipidemia, unspecified: Secondary | ICD-10-CM

## 2021-05-24 DIAGNOSIS — I1310 Hypertensive heart and chronic kidney disease without heart failure, with stage 1 through stage 4 chronic kidney disease, or unspecified chronic kidney disease: Secondary | ICD-10-CM

## 2021-05-24 NOTE — Patient Instructions (Signed)
Visit Information ? ?Thank you for taking time to visit with me today. Please don't hesitate to contact me if I can be of assistance to you before our next scheduled telephone appointment. ? ?Following are the goals we discussed today:  ?(Copy and paste patient goals from clinical care plan here) ? ?Our next appointment is by telephone on 07/27/21 at 11:15 AM ? ?Please call the care guide team at [phone] if you need to cancel or reschedule your appointment.  ? ?If you are experiencing a Mental Health or [place] or need someone to talk to, please call 1-800-273-TALK (toll free, 24 hour hotline)  ? ?Patient verbalizes understanding of instructions and care plan provided today and agrees to view in [place]. Active MyChart status confirmed with patient.   ? ?Barb Merino, RN, BSN, CCM ?Care Management Coordinator ?[facility]  ?Direct Phone: [phone] ? ? ?

## 2021-05-24 NOTE — Chronic Care Management (AMB) (Signed)
?Chronic Care Management  ? ?CCM RN Visit Note ? ?05/24/2021 ?Name: Virginia Delgado MRN: [id number] DOB: May 12, 1945 ? ?Subjective: ?Virginia Delgado is a 76 y.o. year old female who is a primary care patient of Glendale Chard, MD. The care management team was consulted for assistance with disease management and care coordination needs.   ? ?Engaged with patient by telephone for follow up visit in response to provider referral for case management and/or care coordination services.  ? ?Consent to Services:  ?The patient was given information about Chronic Care Management services, agreed to services, and gave verbal consent prior to initiation of services.  Please see initial visit note for detailed documentation.  ? ?Patient agreed to services and verbal consent obtained.  ? ?Assessment: Review of patient past medical history, allergies, medications, health status, including review of consultants reports, laboratory and other test data, was performed as part of comprehensive evaluation and provision of chronic care management services.  ? ?SDOH (Social Determinants of Health) assessments and interventions performed:  Yes, no acute changes  ? ?CCM Care Plan ? ?Allergies  ?Allergen Reactions  ? Contrast Media [Iodinated Contrast Media] Swelling  ?  Of face, especially eyes  ? Gadobutrol Other (See Comments)  ?  Felt extremities go numb briefly as contrast injected.  Had cough (bronchospasm?) briefly after scan.  Resolved on own without treatment.  07/13/15  ? Gadolinium Derivatives Other (See Comments)  ?  Felt extremities go numb briefly as contrast injected.  Had cough (bronchospasm?) briefly after scan.  Resolved on own without treatment.  07/13/15  ? Lidocaine Other (See Comments)  ?  Facial swelling after dental procedure  ? Lovastatin   ?  myalgias  ? Pravachol [Pravastatin Sodium]   ? Latex Rash and Other (See Comments)  ?  and irritation  ? ? ?Outpatient Encounter Medications as of 05/24/2021  ?Medication Sig  ? acetaminophen  (TYLENOL) 500 MG tablet Take 2 tablets (1,000 mg total) by mouth every 6 (six) hours as needed. (Patient taking differently: Take 1,000 mg by mouth every 6 (six) hours as needed for mild pain.)  ? amLODipine (NORVASC) 5 MG tablet Take 1 tablet (5 mg total) by mouth daily.  ? aspirin 81 MG tablet Take 81 mg by mouth at bedtime.  ? Cholecalciferol (VITAMIN D) 50 MCG (2000 UT) tablet Take 4,000 Units by mouth daily.  ? CITRUS BERGAMOT PO Take by mouth.  ? co-enzyme Q-10 30 MG capsule Take 30 mg by mouth daily.  ? ezetimibe (ZETIA) 10 MG tablet Take 1 tablet (10 mg total) by mouth daily.  ? losartan (COZAAR) 25 MG tablet Take 1 tablet (25 mg total) by mouth daily.  ? metoprolol succinate (TOPROL-XL) 50 MG 24 hr tablet Take 1 tablet (50 mg total) by mouth daily. Take with or immediately following a meal.  ? Multiple Minerals-Vitamins (GNP CAL MAG ZINC +D3 PO) Take 1 tablet by mouth in the morning and at bedtime.  ? nitroGLYCERIN (NITROSTAT) 0.4 MG SL tablet Place 1 tablet (0.4 mg total) under the tongue every 5 (five) minutes as needed for chest pain.  ? NONI, MORINDA CITRIFOLIA, PO Take 600 mg by mouth 2 (two) times daily.  ? PRALUENT 150 MG/ML SOAJ INJECT 150 MG INTO THE SKIN EVERY 14 (FOURTEEN) DAYS.  ? ?No facility-administered encounter medications on file as of 05/24/2021.  ? ? ?Patient Active Problem List  ? Diagnosis Date Noted  ? Acute kidney injury superimposed on chronic kidney disease ([place]) 07/18/2020  ? Hypertensive  urgency 07/17/2020  ? S/P CABG x 4 04/26/2020  ? Polyarthritis with positive rheumatoid factor ([place]) 02/10/2020  ? Statin intolerance 11/26/2019  ? CAD (coronary artery disease) 04/05/2019  ? Hypertensive heart and renal disease 03/12/2018  ? Chronic renal disease, stage II 03/12/2018  ? Estrogen deficiency 03/12/2018  ? Hyperlipidemia LDL goal <70 10/20/2017  ? Menopause 07/19/2011  ? Essential hypertension   ? Hepatitis C   ? ? ?Conditions to be addressed/monitored: Hypertensive heart and renal  disease with renal failure, stage 1 through stage 4 or unspecified chronic kidney disease, without heart failure; Coronary artery disease due to lipid rich plaque, Hyperlipidemia  ? ?Care Plan : RN Care Manager Plan of Care  ?Updates made by Lynne Logan, RN since 05/24/2021 12:00 AM  ?  ? ?Problem: Chronic disease education and Care Coordination needs for Hypertensive heart and renal disease with renal failure, stage 1 through stage 4 or unspecified chronic kidney disease, without heart failure; Coronary artery disease due to lipid rich plaque   ?Priority: High  ?  ? ?Long-Range Goal: Assist with Chronic disease education and Care Coordination needs for Hypertensive heart and renal disease with renal failure, stage 1 through stage 4 or unspecified chronic kidney disease; Coronary artery disease due to lipid rich plaque   ?Start Date: 12/22/2020  ?Expected End Date: 12/22/2021  ?Recent Progress: On track  ?Priority: High  ?Note:   ?Current Barriers:  ?Knowledge Deficits related to plan of care for management of Hypertensive heart and renal disease with renal failure, stage 1 through stage 4 or unspecified chronic kidney disease, without heart failure; Coronary artery disease due to lipid rich plaque ?Chronic Disease Management support and education needs related to Hypertensive heart and renal disease with renal failure, stage 1 through stage 4 or unspecified chronic kidney disease, without heart failure; Coronary artery disease due to lipid rich plaque ? ?RNCM Clinical Goal(s):  ?Patient will demonstrate Ongoing health management independence   ?continue to work with RN Care Manager to address care management and care coordination needs related to  Hypertensive heart and renal disease with renal failure, stage 1 through stage 4 or unspecified chronic kidney disease, without heart failure; Coronary artery disease due to lipid rich plaque ?will demonstrate ongoing self health care management ability    through  collaboration with RN Care manager, provider, and care team.  ? ?Interventions: ?1:1 collaboration with primary care provider regarding development and update of comprehensive plan of care as evidenced by provider attestation and co-signature ?Inter-disciplinary care team collaboration (see longitudinal plan of care) ?Evaluation of current treatment plan related to  self management and patient's adherence to plan as established by provider ? ?Hypertension Interventions:  (Status:  Goal on track:  Yes.) Long Term Goal ?Last practice recorded BP readings:  ?BP Readings from Last 3 Encounters:  ?05/21/21 140/80  ?02/11/21 118/78  ?11/17/20 132/78  ?Most recent eGFR/CrCl:  ?Lab Results  ?Component Value Date  ? EGFR 54 (L) 05/12/2021  ?  No components found for: CRCL ?Evaluation of current treatment plan related to hypertension self management and patient's adherence to plan as established by provider ?Review of patient status, including review of consultant's reports, relevant laboratory and other test results, and medications completed per Dr. Claiborne Billings with Cardiac follow up completed on 05/21/21 ?Reviewed medications with patient and discussed importance of medication adherence ?Medication Instructions:  ?START METOPROLOL SUCCINATE 50MG ONCE DAILY ?*If you need a refill on your cardiac medications before your next appointment, please call  your pharmacy* ?Lab Work:                              Testing/Procedures:  ?NONE                                      NONE ?Special Instructions ?REFERRAL TO NEUROLOGY-SOMEONE WLL BE CALLING YOU TO SCHEDULE THIS APPT ?Follow-Up: ?Your next appointment:  4-6 month(s) In Person with Skeet Latch, MD  ?Please call our office 2 months in advance to schedule this appointment V ?Advised patient, providing education and rationale, to monitor blood pressure daily and record, calling PCP for findings outside established parameters ?Educated on exercise recommendations, mailed printed  educational materials related to Chair Exercises  ?Provided education on prescribed diet low Sodium ?Determined patient verbalizes understanding of her prescribed treatment plan  ? ?CAD Interventions: (Status:

## 2021-05-28 DIAGNOSIS — I2510 Atherosclerotic heart disease of native coronary artery without angina pectoris: Secondary | ICD-10-CM

## 2021-05-28 DIAGNOSIS — F0390 Unspecified dementia without behavioral disturbance: Secondary | ICD-10-CM

## 2021-05-28 DIAGNOSIS — I2583 Coronary atherosclerosis due to lipid rich plaque: Secondary | ICD-10-CM

## 2021-05-28 DIAGNOSIS — E785 Hyperlipidemia, unspecified: Secondary | ICD-10-CM | POA: Diagnosis not present

## 2021-05-28 DIAGNOSIS — I10 Essential (primary) hypertension: Secondary | ICD-10-CM

## 2021-05-28 DIAGNOSIS — R69 Illness, unspecified: Secondary | ICD-10-CM | POA: Diagnosis not present

## 2021-05-31 ENCOUNTER — Encounter: Payer: Self-pay | Admitting: Cardiovascular Disease

## 2021-06-02 ENCOUNTER — Encounter: Payer: Self-pay | Admitting: Cardiovascular Disease

## 2021-06-29 ENCOUNTER — Encounter: Payer: Self-pay | Admitting: Internal Medicine

## 2021-07-01 ENCOUNTER — Telehealth (INDEPENDENT_AMBULATORY_CARE_PROVIDER_SITE_OTHER): Payer: Medicare HMO | Admitting: Internal Medicine

## 2021-07-01 ENCOUNTER — Encounter: Payer: Self-pay | Admitting: Internal Medicine

## 2021-07-01 DIAGNOSIS — R0789 Other chest pain: Secondary | ICD-10-CM | POA: Diagnosis not present

## 2021-07-01 DIAGNOSIS — M25562 Pain in left knee: Secondary | ICD-10-CM

## 2021-07-01 NOTE — Progress Notes (Signed)
? ?Virtual Visit via VIDEO  ? ?This visit type was conducted due to national recommendations for restrictions regarding the COVID-19 Pandemic (e.g. social distancing) in an effort to limit this patient's exposure and mitigate transmission in our community.  Due to her co-morbid illnesses, this patient is at least at moderate risk for complications without adequate follow up.  This format is felt to be most appropriate for this patient at this time.  All issues noted in this document were discussed and addressed.  A limited physical exam was performed with this format.   ? ?This visit type was conducted due to national recommendations for restrictions regarding the COVID-19 Pandemic (e.g. social distancing) in an effort to limit this patient's exposure and mitigate transmission in our community.  Patients identity confirmed using two different identifiers.  This format is felt to be most appropriate for this patient at this time.  All issues noted in this document were discussed and addressed.  No physical exam was performed (except for noted visual exam findings with Video Visits).   ? ?Date:  07/01/2021  ? ?ID:  Virginia Delgado, DOB 1946-01-25, MRN [id number] ? ?Patient Location:  ?Home ? ?Provider location:   ?Office ? ?Chief Complaint:  "I have knee issues" ? ?History of Present Illness:   ? ?Virginia Delgado is a 76 y.o. female who presents via video conferencing for a telehealth visit today.   ? ?The patient does not have symptoms concerning for COVID-19 infection (fever, chills, cough, or new shortness of breath).  ? ?She presents today for virtual visit. She prefers this method of contact due to COVID-19 pandemic.  She c/o left knee pain. She is having difficulty going down steps. She denies fall/trauma. States her sx triggered by walking. Sx have worsened that this is now impacting her exercise. She would like to have an x-ray.  ? ?Of note, she has permanently moved to [place]. She wants to continue with her care here in  [facility] for primary care and Cardiology.  ?  ? ?Knee Pain  ?There was no injury mechanism. The pain is present in the left leg. The quality of the pain is described as aching. The pain is at a severity of 7/10. The pain is moderate. The pain has been Fluctuating since onset. The symptoms are aggravated by movement and weight bearing. She has tried acetaminophen for the symptoms. The treatment provided mild relief.   ? ?Past Medical History:  ?Diagnosis Date  ? Coronary artery calcification 04/05/2019  ? Calcium score 93rd percentile 08/2018.  ? Dermatitis   ? Hx of hepatitis C   ? Hypertension   ? ?Past Surgical History:  ?Procedure Laterality Date  ? BUNIONECTOMY Bilateral   ? CESAREAN SECTION  1976  ? [place]  ? CLIPPING OF ATRIAL APPENDAGE Left 04/22/2020  ? Procedure: CLIPPING OF  LEFT ATRIAL APPENDAGE USING ATRICURE ATRICLIPFLEX-V 35;  Surgeon: Wonda Olds, MD;  Location: [place];  Service: Open Heart Surgery;  Laterality: Left;  ? CORONARY ARTERY BYPASS GRAFT N/A 04/22/2020  ? Procedure: CORONARY ARTERY BYPASS GRAFTING ([place]). USING BILATERAL MAMMARIES;  Surgeon: Wonda Olds, MD;  Location: [employer];  Service: Open Heart Surgery;  Laterality: N/A;  B[facility]  ? LEFT HEART CATH AND CORONARY ANGIOGRAPHY N/A 04/20/2020  ? Procedure: LEFT HEART CATH AND CORONARY ANGIOGRAPHY;  Surgeon: Nelva Bush, MD;  Location: [place] [facility];  Service: Cardiovascular;  Laterality: N/A;  ? RADIAL ARTERY HARVEST Right 04/22/2020  ? Procedure: RIGHT  RADIAL ARTERY HARVESTING.;  Surgeon: Wonda Olds, MD;  Location: [place];  Service: Open Heart Surgery;  Laterality: Right;  ? TEE WITHOUT CARDIOVERSION N/A 04/22/2020  ? Procedure: TRANSESOPHAGEAL ECHOCARDIOGRAM (TEE);  Surgeon: Wonda Olds, MD;  Location: [place];  Service: Open Heart Surgery;  Laterality: N/A;  ? [place]  ?  ? ?No outpatient medications have been marked as taking for the 07/01/21 encounter (Video Visit) with Glendale Chard, MD.  ?  ? ?Allergies:   Contrast media [iodinated contrast media], Gadobutrol, Gadolinium derivatives, Lidocaine, Lovastatin, Pravachol [pravastatin sodium], and Latex  ? ?Social History  ? ?Tobacco Use  ? Smoking status: Never  ? Smokeless tobacco: Never  ?Vaping Use  ? Vaping Use: Never used  ?Substance Use Topics  ? Alcohol use: Not Currently  ?  Comment: social  ? Drug use: No  ?  ? ?Family Hx: ?The patient's family history includes COPD in her mother; Cancer in her mother and sister; Heart disease in her father; Hypertension in her brother and sister; Ovarian cancer in her maternal grandmother; Rheum arthritis in her paternal grandmother. ? ?ROS:   ?Please see the history of present illness.    ?Review of Systems  ?Constitutional: Negative.   ?Respiratory: Negative.    ?Cardiovascular:  Positive for chest pain.  ?     She also wants to have her heart checked. She admits that she did something she should not have, she lifted a heavy object. Reports she moved/lifted a suitcase and now has chest pain. It is located beneath her left breast - exacerbated with movement. No associated n/v, diaphoresis, palpitations and/or SOB.   ?Gastrointestinal: Negative.   ?Musculoskeletal:  Positive for joint pain.  ?Neurological: Negative.   ?Psychiatric/Behavioral: Negative.     ?All other systems reviewed and are negative. ? ? ?Labs/Other Tests and Data Reviewed:   ? ?Recent Labs: ?05/12/2021: ALT 11; BUN 11; Creatinine, Ser 1.09; Hemoglobin 13.2; Platelets 201; Potassium 4.0; Sodium 142  ? ?Recent Lipid Panel ?Lab Results  ?Component Value Date/Time  ? CHOL 146 05/12/2021 12:56 PM  ? TRIG 74 05/12/2021 12:56 PM  ? HDL 77 05/12/2021 12:56 PM  ? CHOLHDL 1.9 05/12/2021 12:56 PM  ? LDLCALC 55 05/12/2021 12:56 PM  ? ? ?Wt Readings from Last 3 Encounters:  ?05/21/21 131 lb (59.4 kg)  ?02/11/21 130 lb 3.2 oz (59.1 kg)  ?01/14/21 130 lb (59 kg)  ?  ? ?Exam:   ? ?Vital Signs:  There were no vitals taken for this visit.   ? ? ?Physical Exam ?Vitals and nursing note reviewed.  ?Constitutional:   ?   Appearance: Normal appearance.  ?HENT:  ?   Head: Normocephalic and atraumatic.  ?Eyes:  ?   Extraocular Movements: Extraocular movements intact.  ?Pulmonary:  ?   Effort: Pulmonary effort is normal.  ?Musculoskeletal:  ?   Cervical back: Normal range of motion.  ?Neurological:  ?   Mental Status: She is alert and oriented to person, place, and time.  ?Psychiatric:     ?   Mood and Affect: Affect normal.  ? ? ?ASSESSMENT & PLAN:   ? ?1. Acute pain of left knee ?Comments: I will refer her to Ortho for further evaluation and radiographic studies. Advised to apply topical Voltaren gel to affected area twice daily prn. ?- Ambulatory referral to Orthopedic Surgery ? ?2. Atypical chest pain ?I think her sx are musculoskeletal in nature. She was asked to do two movements  during the visit, she does have worsening of sx with certain movements. She will let me know if her sx persist. Advised to go to ER if she continues to have pain, along with diaphoresis, N/V, palpitation and/or SOB.  ?  ? ?COVID-19 Education: ?The signs and symptoms of COVID-19 were discussed with the patient and how to seek care for testing (follow up with PCP or arrange E-visit).  The importance of social distancing was discussed today. ? ?Patient Risk:   ?After full review of this patients clinical status, I feel that they are at least moderate risk at this time. ? ?Time:   ?Today, I have spent 16 minutes with the patient with telehealth technology discussing above diagnoses.   ? ? ?Medication Adjustments/Labs and Tests Ordered: ?Current medicines are reviewed at length with the patient today.  Concerns regarding medicines are outlined above.  ? ?Tests Ordered: ?Orders Placed This Encounter  ?Procedures  ? Ambulatory referral to Orthopedic Surgery  ? ? ?Medication Changes: ?No orders of the defined types were placed in this encounter. ? ? ?Disposition:  Follow up  prn ? ?Signed, ?Maximino Greenland, MD  ? ?

## 2021-07-05 ENCOUNTER — Encounter: Payer: Self-pay | Admitting: Internal Medicine

## 2021-07-05 DIAGNOSIS — R079 Chest pain, unspecified: Secondary | ICD-10-CM | POA: Diagnosis not present

## 2021-07-06 DIAGNOSIS — R0781 Pleurodynia: Secondary | ICD-10-CM | POA: Diagnosis not present

## 2021-07-06 DIAGNOSIS — R0789 Other chest pain: Secondary | ICD-10-CM | POA: Diagnosis not present

## 2021-07-06 DIAGNOSIS — X500XXA Overexertion from strenuous movement or load, initial encounter: Secondary | ICD-10-CM | POA: Diagnosis not present

## 2021-07-06 DIAGNOSIS — Z951 Presence of aortocoronary bypass graft: Secondary | ICD-10-CM | POA: Diagnosis not present

## 2021-07-06 DIAGNOSIS — S2232XA Fracture of one rib, left side, initial encounter for closed fracture: Secondary | ICD-10-CM | POA: Diagnosis not present

## 2021-07-06 DIAGNOSIS — I10 Essential (primary) hypertension: Secondary | ICD-10-CM | POA: Diagnosis not present

## 2021-07-06 DIAGNOSIS — Z803 Family history of malignant neoplasm of breast: Secondary | ICD-10-CM | POA: Diagnosis not present

## 2021-07-06 LAB — BASIC METABOLIC PANEL
BUN: 15 (ref 4–21)
CO2: 28 — AB (ref 13–22)
Chloride: 105 (ref 99–108)
Creatinine: 1 (ref 0.5–1.1)
Potassium: 4 mEq/L (ref 3.5–5.1)
Sodium: 142 (ref 137–147)

## 2021-07-06 LAB — COMPREHENSIVE METABOLIC PANEL
Albumin: 3.8 (ref 3.5–5.0)
Calcium: 9.8 (ref 8.7–10.7)
eGFR: 59

## 2021-07-06 LAB — HEPATIC FUNCTION PANEL
ALT: 9 U/L (ref 7–35)
AST: 18 (ref 13–35)
Alkaline Phosphatase: 64 (ref 25–125)
Bilirubin, Total: 6.7

## 2021-07-07 ENCOUNTER — Telehealth: Payer: Self-pay

## 2021-07-07 ENCOUNTER — Encounter: Payer: Self-pay | Admitting: Internal Medicine

## 2021-07-07 NOTE — Telephone Encounter (Signed)
Transition Care Management Follow-up Telephone Call ?Date of discharge and from where: 07/06/2021  ?How have you been since you were released from the hospital? Pt reports hanging in there. She is in good spirits.  ?Any questions or concerns? No ? ?Items Reviewed: ?Did the pt receive and understand the discharge instructions provided? Yes  ?Medications obtained and verified? Yes  ?Other? Yes  ?Any new allergies since your discharge? No  ?Dietary orders reviewed? No ?Do you have support at home? Yes  ? ?Home Care and Equipment/Supplies: ?Were home health services ordered? no ?If so, what is the name of the agency? N/a  ?Has the agency set up a time to come to the patient's home? no ?Were any new equipment or medical supplies ordered?  No ?What is the name of the medical supply agency? N/a ?Were you able to get the supplies/equipment? no ?Do you have any questions related to the use of the equipment or supplies? No ? ?Functional Questionnaire: (I = Independent and D = Dependent) ?ADLs: i ? ?Bathing/Dressing- i ? ?Meal Prep- i ? ?Eating- i ? ?Maintaining continence- i ? ?Transferring/Ambulation- i ? ?Managing Meds- i ? ?Follow up appointments reviewed: ? ?PCP Hospital f/u appt confirmed? No  Scheduled to see n/a on n/a @ n/a. ?[facility] f/u appt confirmed? No  Scheduled to see n/a on n/a @ n/a. ?Are transportation arrangements needed? No  ?If their condition worsens, is the pt aware to call PCP or go to the Emergency Dept.? Yes ?Was the patient provided with contact information for the PCP's office or ED? Yes ?Was to pt encouraged to call back with questions or concerns? Yes  ?

## 2021-07-07 NOTE — Telephone Encounter (Signed)
Error

## 2021-07-12 DIAGNOSIS — M25562 Pain in left knee: Secondary | ICD-10-CM | POA: Diagnosis not present

## 2021-07-12 DIAGNOSIS — Z7182 Exercise counseling: Secondary | ICD-10-CM | POA: Diagnosis not present

## 2021-07-13 ENCOUNTER — Ambulatory Visit: Payer: Medicare HMO | Admitting: Diagnostic Neuroimaging

## 2021-07-14 ENCOUNTER — Encounter: Payer: Self-pay | Admitting: Internal Medicine

## 2021-07-14 ENCOUNTER — Ambulatory Visit (INDEPENDENT_AMBULATORY_CARE_PROVIDER_SITE_OTHER): Payer: Medicare HMO | Admitting: Internal Medicine

## 2021-07-14 VITALS — BP 130/82 | HR 60 | Temp 98.6°F | Ht 60.0 in | Wt 129.0 lb

## 2021-07-14 DIAGNOSIS — M1712 Unilateral primary osteoarthritis, left knee: Secondary | ICD-10-CM

## 2021-07-14 DIAGNOSIS — I2510 Atherosclerotic heart disease of native coronary artery without angina pectoris: Secondary | ICD-10-CM

## 2021-07-14 DIAGNOSIS — Z1231 Encounter for screening mammogram for malignant neoplasm of breast: Secondary | ICD-10-CM

## 2021-07-14 DIAGNOSIS — E559 Vitamin D deficiency, unspecified: Secondary | ICD-10-CM | POA: Diagnosis not present

## 2021-07-14 DIAGNOSIS — R413 Other amnesia: Secondary | ICD-10-CM | POA: Diagnosis not present

## 2021-07-14 DIAGNOSIS — S2232XD Fracture of one rib, left side, subsequent encounter for fracture with routine healing: Secondary | ICD-10-CM

## 2021-07-14 DIAGNOSIS — I1310 Hypertensive heart and chronic kidney disease without heart failure, with stage 1 through stage 4 chronic kidney disease, or unspecified chronic kidney disease: Secondary | ICD-10-CM | POA: Diagnosis not present

## 2021-07-14 DIAGNOSIS — I2583 Coronary atherosclerosis due to lipid rich plaque: Secondary | ICD-10-CM

## 2021-07-14 DIAGNOSIS — N1831 Chronic kidney disease, stage 3a: Secondary | ICD-10-CM

## 2021-07-14 NOTE — Progress Notes (Signed)
Safety protocols were in place, including screening questions prior to the visit, additional usage of staff PPE, and extensive cleaning of exam room while observing appropriate contact time as indicated for disinfecting solutions.  Subjective:     Patient ID: Virginia Delgado , female    DOB: 05-Oct-1945 , 76 y.o.   MRN: [id number]   Chief Complaint  Patient presents with   Rib Fracture    HPI  She presents today for ED f/u. She presented to [facility] on 5/9 for further evaluation of L chest pain following lifting a Brita filter one week ago. I initially saw her as a virtual visit and I felt her sx were musculoskeletal, yet due to her cardiac history, I advised her to go to ER if her sx persisted/worsened. She did go to UC, who then referred her to ER for CXR. Pt denies fever, chills, n/v/d, states she feels SOB with bending over and inability to take a deep breath d/t pain. She denied fall/trauma. ER workup revealed markedly elevated BP, treated with iv hydralazine. Rib XR significant for questionable fractures of left posterior ninth and tenth ribs.        Past Medical History:  Diagnosis Date   Coronary artery calcification 04/05/2019   Calcium score 93rd percentile 08/2018.   Dermatitis    Hx of hepatitis C    Hypertension      Family History  Problem Relation Age of Onset   Cancer Mother        breast   COPD Mother    Heart disease Father    Cancer Sister        sarcoma   Hypertension Brother    Ovarian cancer Maternal Grandmother    Rheum arthritis Paternal Grandmother    Hypertension Sister      Current Outpatient Medications:    acetaminophen (TYLENOL) 500 MG tablet, Take 2 tablets (1,000 mg total) by mouth every 6 (six) hours as needed. (Patient taking differently: Take 1,000 mg by mouth every 6 (six) hours as needed for mild pain.), Disp: 30 tablet, Rfl: 0   amLODipine (NORVASC) 5 MG tablet, Take 1 tablet (5 mg total) by mouth daily., Disp: 180 tablet, Rfl: 3   aspirin  81 MG tablet, Take 81 mg by mouth at bedtime., Disp: , Rfl:    Cholecalciferol (VITAMIN D) 50 MCG (2000 UT) tablet, Take 4,000 Units by mouth daily., Disp: , Rfl:    CITRUS BERGAMOT PO, Take by mouth., Disp: , Rfl:    co-enzyme Q-10 30 MG capsule, Take 30 mg by mouth daily., Disp: , Rfl:    ezetimibe (ZETIA) 10 MG tablet, Take 1 tablet (10 mg total) by mouth daily., Disp: 90 tablet, Rfl: 3   metoprolol succinate (TOPROL-XL) 50 MG 24 hr tablet, Take 1 tablet (50 mg total) by mouth daily. Take with or immediately following a meal., Disp: 30 tablet, Rfl: 6   Multiple Minerals-Vitamins (GNP CAL MAG ZINC +D3 PO), Take 1 tablet by mouth in the morning and at bedtime., Disp: , Rfl:    NONI, MORINDA CITRIFOLIA, PO, Take 600 mg by mouth 2 (two) times daily., Disp: , Rfl:    PRALUENT 150 MG/ML SOAJ, INJECT 150 MG INTO THE SKIN EVERY 14 (FOURTEEN) DAYS., Disp: 6 mL, Rfl: 3   losartan (COZAAR) 25 MG tablet, Take 1 tablet (25 mg total) by mouth daily., Disp: 90 tablet, Rfl: 3   Allergies  Allergen Reactions   Contrast Media [Iodinated Contrast Media] Swelling  Of face, especially eyes   Gadobutrol Other (See Comments)    Felt extremities go numb briefly as contrast injected.  Had cough (bronchospasm?) briefly after scan.  Resolved on own without treatment.  07/13/15   Gadolinium Derivatives Other (See Comments)    Felt extremities go numb briefly as contrast injected.  Had cough (bronchospasm?) briefly after scan.  Resolved on own without treatment.  07/13/15   Lidocaine Other (See Comments)    Facial swelling after dental procedure   Lovastatin     myalgias   Pravachol [Pravastatin Sodium]    Latex Rash and Other (See Comments)    and irritation     Review of Systems  Constitutional: Negative.   Respiratory: Negative.    Cardiovascular: Negative.   Gastrointestinal: Negative.   Musculoskeletal:  Positive for arthralgias.        She c/o left knee pain that has been present for approximately 3  weeks. The patient states that the pain started after walking approximately 3 miles at once while she was in [place]. Since then she has had a gradual increase in her pain. She denies any previous history of left knee injury. Today she complains of pain on the anterior and posterior aspects of the left knee. She describes as a pulling sensation that is moderate in severity. She was seen by Ortho in [place], given home exercises to perform. She has f/u in 4-6 weeks.   Neurological: Negative.        She c/o memory issues. Not sure what my have triggered her sx. Does not feel she is under a lot of stress. Has appt with Neuro tomorrow.   Psychiatric/Behavioral: Negative.      Today's Vitals   07/14/21 1431  BP: 130/82  Pulse: 60  Temp: 98.6 F (37 C)  Weight: 129 lb (58.5 kg)  Height: 5' (1.524 m)  PainSc: 0-No pain   Body mass index is 25.19 kg/m.   Wt Readings from Last 3 Encounters:  07/15/21 132 lb (59.9 kg)  07/14/21 129 lb (58.5 kg)  05/21/21 131 lb (59.4 kg)     Objective:  Physical Exam Vitals and nursing note reviewed.  Constitutional:      Appearance: Normal appearance.  HENT:     Head: Normocephalic and atraumatic.  Cardiovascular:     Rate and Rhythm: Normal rate and regular rhythm.     Heart sounds: Normal heart sounds.  Pulmonary:     Effort: Pulmonary effort is normal.     Breath sounds: Normal breath sounds.  Chest:     Comments: Left rib TTP, no overlying erythema Musculoskeletal:     Cervical back: Normal range of motion.     Comments: Ambulatory with cane  Skin:    General: Skin is warm.  Neurological:     General: No focal deficit present.     Mental Status: She is alert.  Psychiatric:        Mood and Affect: Mood normal.        Behavior: Behavior normal.     Assessment And Plan:     1. Closed fracture of one rib of left side with routine healing, subsequent encounter Comments: She is unable to use lidocaine patch due to allergy, so she will try  mentholated patch instead, along with Tylenol prn.  - DG Bone Density; Future  2. Hypertensive heart and renal disease with renal failure, stage 1 through stage 4 or unspecified chronic kidney disease, without heart failure Comments: Chronic, controlled.  She is encouraged to follow low sodium diet.   3. Coronary artery disease due to lipid rich plaque Comments: She is s/p CABG x 4. Encouraged to c/w ASA, BBlocker and statin therapy.  4. Stage 3a chronic kidney disease ([place]) Comments: Chonic, I will check Renal labs as below. Advised ot avoid NSAIDS, stay hydrated and keep BP well controlled to decrease risk of CKD progression.  - Protein electrophoresis, serum  5. Primary osteoarthritis of left knee Comments: Now ambulatory with cane, Ortho input appreciated.   6. Memory changes Comments: I will check labs as below. May benefit from B12 supplementation.  - TSH - Vitamin B12 - RPR  7. Vitamin D deficiency Comments: I will check vitamin D level and supplement as needed.  - Vitamin D (25 hydroxy)  8. Encounter for screening mammogram for malignant neoplasm of breast Comments: She is encouraged to perform monthly SBE. I will refer her for mammogram.  - MM DIGITAL SCREENING BILATERAL; Future   Patient was given opportunity to ask questions. Patient verbalized understanding of the plan and was able to repeat key elements of the plan. All questions were answered to their satisfaction.   I, Maximino Greenland, MD, have reviewed all documentation for this visit. The documentation on 07/26/21 for the exam, diagnosis, procedures, and orders are all accurate and complete.   IF YOU HAVE BEEN REFERRED TO A SPECIALIST, IT MAY TAKE 1-2 WEEKS TO SCHEDULE/PROCESS THE REFERRAL. IF YOU HAVE NOT HEARD FROM US/SPECIALIST IN TWO WEEKS, PLEASE GIVE [place] A CALL AT [phone].   THE PATIENT IS ENCOURAGED TO PRACTICE SOCIAL DISTANCING DUE TO THE COVID-19 PANDEMIC.

## 2021-07-14 NOTE — Progress Notes (Signed)
Rich Brave Llittleton,acting as a Education administrator for Maximino Greenland, MD.,have documented all relevant documentation on the behalf of Maximino Greenland, MD,as directed by  Maximino Greenland, MD while in the presence of Maximino Greenland, MD.  This visit occurred during the SARS-CoV-2 public health emergency.  Safety protocols were in place, including screening questions prior to the visit, additional usage of staff PPE, and extensive cleaning of exam room while observing appropriate contact time as indicated for disinfecting solutions.  Subjective:     Patient ID: Virginia Delgado , female    DOB: December 06, 1945 , 76 y.o.   MRN: [id number]   Chief Complaint  Patient presents with   Rib Fracture    HPI  Patient presents today for a rib fracture and knee pain. She reports she had an xray done and was told she has a fractured rib. Patient also reports having knee pain.     Past Medical History:  Diagnosis Date   Coronary artery calcification 04/05/2019   Calcium score 93rd percentile 08/2018.   Dermatitis    Hx of hepatitis C    Hypertension      Family History  Problem Relation Age of Onset   Cancer Mother        breast   COPD Mother    Heart disease Father    Cancer Sister        sarcoma   Hypertension Brother    Ovarian cancer Maternal Grandmother    Rheum arthritis Paternal Grandmother    Hypertension Sister      Current Outpatient Medications:    acetaminophen (TYLENOL) 500 MG tablet, Take 2 tablets (1,000 mg total) by mouth every 6 (six) hours as needed. (Patient taking differently: Take 1,000 mg by mouth every 6 (six) hours as needed for mild pain.), Disp: 30 tablet, Rfl: 0   amLODipine (NORVASC) 5 MG tablet, Take 1 tablet (5 mg total) by mouth daily., Disp: 180 tablet, Rfl: 3   aspirin 81 MG tablet, Take 81 mg by mouth at bedtime., Disp: , Rfl:    Cholecalciferol (VITAMIN D) 50 MCG (2000 UT) tablet, Take 4,000 Units by mouth daily., Disp: , Rfl:    CITRUS BERGAMOT PO, Take by mouth., Disp:  , Rfl:    co-enzyme Q-10 30 MG capsule, Take 30 mg by mouth daily., Disp: , Rfl:    ezetimibe (ZETIA) 10 MG tablet, Take 1 tablet (10 mg total) by mouth daily., Disp: 90 tablet, Rfl: 3   metoprolol succinate (TOPROL-XL) 50 MG 24 hr tablet, Take 1 tablet (50 mg total) by mouth daily. Take with or immediately following a meal., Disp: 30 tablet, Rfl: 6   Multiple Minerals-Vitamins (GNP CAL MAG ZINC +D3 PO), Take 1 tablet by mouth in the morning and at bedtime., Disp: , Rfl:    NONI, MORINDA CITRIFOLIA, PO, Take 600 mg by mouth 2 (two) times daily., Disp: , Rfl:    PRALUENT 150 MG/ML SOAJ, INJECT 150 MG INTO THE SKIN EVERY 14 (FOURTEEN) DAYS., Disp: 6 mL, Rfl: 3   losartan (COZAAR) 25 MG tablet, Take 1 tablet (25 mg total) by mouth daily. (Patient not taking: Reported on 07/14/2021), Disp: 90 tablet, Rfl: 3   Allergies  Allergen Reactions   Contrast Media [Iodinated Contrast Media] Swelling    Of face, especially eyes   Gadobutrol Other (See Comments)    Felt extremities go numb briefly as contrast injected.  Had cough (bronchospasm?) briefly after scan.  Resolved on own without treatment.  07/13/15  Gadolinium Derivatives Other (See Comments)    Felt extremities go numb briefly as contrast injected.  Had cough (bronchospasm?) briefly after scan.  Resolved on own without treatment.  07/13/15   Lidocaine Other (See Comments)    Facial swelling after dental procedure   Lovastatin     myalgias   Pravachol [Pravastatin Sodium]    Latex Rash and Other (See Comments)    and irritation     Review of Systems   Today's Vitals   07/14/21 1431  Temp: 98.6 F (37 C)  Weight: 129 lb (58.5 kg)  Height: 5' (1.524 m)  PainSc: 0-No pain   Body mass index is 25.19 kg/m.  Wt Readings from Last 3 Encounters:  07/14/21 129 lb (58.5 kg)  05/21/21 131 lb (59.4 kg)  02/11/21 130 lb 3.2 oz (59.1 kg)    Objective:  Physical Exam      Assessment And Plan:     There are no diagnoses linked to this  encounter.    Patient was given opportunity to ask questions. Patient verbalized understanding of the plan and was able to repeat key elements of the plan. All questions were answered to their satisfaction.  Sheppard Evens Llittleton, CMA   I, [place], CMA, have reviewed all documentation for this visit. The documentation on 07/14/21 for the exam, diagnosis, procedures, and orders are all accurate and complete.   IF YOU HAVE BEEN REFERRED TO A SPECIALIST, IT MAY TAKE 1-2 WEEKS TO SCHEDULE/PROCESS THE REFERRAL. IF YOU HAVE NOT HEARD FROM US/SPECIALIST IN TWO WEEKS, PLEASE GIVE [place] A CALL AT [phone].   THE PATIENT IS ENCOURAGED TO PRACTICE SOCIAL DISTANCING DUE TO THE COVID-19 PANDEMIC.

## 2021-07-14 NOTE — Patient Instructions (Signed)
Rib Fracture  A rib fracture is a break or crack in one of the bones of the ribs. The ribs are long, curved bones that wrap around your chest and attach to your spine and your breastbone. The ribs protect your heart, lungs, and other organs in the chest. A broken or cracked rib is often painful but is not usually serious. Most rib fractures heal on their own over time. However, rib fractures can be more serious if multiple ribs are broken or if broken ribs move out of place and push against other structures or organs. What are the causes? This condition is caused by: Repetitive movements with high force, such as pitching a baseball or having very bad coughing spells. A direct hit the chest, such as a sports injury, a car crash, or a fall. Cancer that has spread to the bones, which can weaken bones and cause them to break. What are the signs or symptoms? Symptoms of this condition include: Pain when you breathe in or cough. Pain when someone presses on the injured area. Feeling short of breath. How is this diagnosed? This condition is diagnosed with a physical exam and medical history. You may also have imaging tests, such as: Chest X-ray. CT scan. MRI. Bone scan. Chest ultrasound. How is this treated? Treatment for this condition depends on the severity of the fracture. Most rib fractures usually heal on their own in 1-3 months. Healing may take longer if you have a cough or if you do activities that make the injury worse. While you heal, you may be given medicines to control the pain. You will also be taught deep breathing exercises. Severe injuries may require hospitalization or surgery. Follow these instructions at home: Managing pain, stiffness, and swelling If directed, put ice on the injured area. To do this: Put ice in a plastic bag. Place a towel between your skin and the bag. Leave the ice on for 20 minutes, 2-3 times a day. Remove the ice if your skin turns bright red. This is  very important. If you cannot feel pain, heat, or cold, you have a greater risk of damage to the area. Take over-the-counter and prescription medicines only as told by your health care provider. Activity Avoid doing activities or movements that cause pain. Be careful during activities and avoid bumping the injured rib. Slowly increase your activity as told by your health care provider. General instructions Do deep breathing exercises as told by your health care provider. This helps prevent pneumonia, which is a common complication of a broken rib. Your health care provider may instruct you to: Take deep breaths several times a day. Try to cough several times a day, holding a pillow against the injured area. Use a device called incentive spirometer to practice deep breathing several times a day. Drink enough fluid to keep your urine pale yellow. Do not wear a rib belt or binder. These restrict breathing, which can lead to pneumonia. Keep all follow-up visits. This is important. Contact a health care provider if: You have a fever. Get help right away if: You have difficulty breathing or you are short of breath. You develop a cough that does not stop, or you cough up thick or bloody sputum. You have nausea, vomiting, or pain in your abdomen. Your pain gets worse and medicine does not help. These symptoms may represent a serious problem that is an emergency. Do not wait to see if the symptoms will go away. Get medical help right away. Call   your local emergency services (911 in the [place] Do not drive yourself to the hospital. Summary A rib fracture is a break or crack in one of the bones of the ribs. A broken or cracked rib is often painful but is not usually serious. Most rib fractures heal on their own over time. Treatment for this condition depends on the severity of the fracture. Avoid doing any activities or movements that cause pain. This information is not intended to replace advice  given to you by your health care provider. Make sure you discuss any questions you have with your health care provider. Document Revised: 06/07/2019 Document Reviewed: 06/07/2019 Elsevier Patient Education  2023 Elsevier Inc.  

## 2021-07-15 ENCOUNTER — Encounter: Payer: Self-pay | Admitting: Neurology

## 2021-07-15 ENCOUNTER — Ambulatory Visit: Payer: Medicare HMO | Admitting: Neurology

## 2021-07-15 VITALS — BP 157/80 | HR 58 | Ht 60.25 in | Wt 132.0 lb

## 2021-07-15 DIAGNOSIS — R4189 Other symptoms and signs involving cognitive functions and awareness: Secondary | ICD-10-CM

## 2021-07-15 NOTE — Progress Notes (Signed)
All  Chief Complaint  Patient presents with   New Patient (Initial Visit)    Rm EMG/NCV 3. Alone. NP internal referral for cognitive changes/memory changes. [facility] 21/30.      ASSESSMENT AND PLAN  Virginia Delgado is a 76 y.o. female    Mild cognitive impairment  MoCA examination 21/30  Happened after CABG surgery in February 2022,  Deny a family history of dementia  MRI of the brain showed small vessel disease  Laboratory evaluation to rule out treatable etiology  Encouraged to continue moderate exercise, seeking psychotherapy for excessive stress,  Return to clinic for new issues   DIAGNOSTIC DATA (LABS, IMAGING, TESTING) - I reviewed patient records, labs, notes, testing and imaging myself where available.   MEDICAL HISTORY:  Virginia Delgado is a 76 year old female, seen in request by her primary care doctor Shelva Majestic for evaluation of memory loss, initial evaluation was on Jul 15, 2021.  I reviewed and summarized the referring note. PMHx. 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  PHYSICAL EXAMNIATION:  Gen: NAD, conversant, well nourised, well groomed                     Cardiovascular: Regular rate rhythm, no peripheral edema, warm, nontender. Eyes: Conjunctivae clear without exudates or hemorrhage Neck: Supple, no carotid bruits. Pulmonary: Clear to auscultation bilaterally   NEUROLOGICAL EXAM:  MENTAL STATUS: Speech/cognition: Awake, alert, oriented to history taking and casual conversation     07/15/2021    1:32 PM  Montreal Cognitive Assessment   Visuospatial/ Executive (0/5) 1  Naming (0/3) 2  Attention: Read list of digits (0/2) 1  Attention: Read list of letters (0/1) 1  Attention: Serial 7 subtraction starting at 100 (0/3) 3  Language: Repeat phrase (0/2) 2  Language : Fluency (0/1) 1  Abstraction (0/2) 2  Delayed Recall (0/5) 2  Orientation (0/6) 6  Total 21  Adjusted Score (based on education) 21    CRANIAL NERVES: CN II: Visual fields are full to confrontation. Pupils are round equal and briskly reactive to light. CN III, IV, VI: extraocular movement are normal. No ptosis. CN V: Facial sensation is intact to light touch CN VII: Face is symmetric with normal eye closure  CN VIII: Hearing is normal to causal conversation. CN IX, X: Phonation is normal.  CN XI: Head turning and shoulder shrug are intact  MOTOR: There is no pronator drift of out-stretched arms. Muscle bulk and tone are normal. Muscle strength is normal.  REFLEXES: Reflexes are 2+ and symmetric at the biceps, triceps, knees, and ankles. Plantar responses are flexor.  SENSORY: Intact to light touch, pinprick and vibratory sensation are intact in fingers and toes.  COORDINATION: There is no trunk or  limb dysmetria noted.  GAIT/STANCE: Posture is normal. Gait is steady with normal steps, base, arm swing, and turning. Heel and toe walking are normal. Tandem gait is normal.  Romberg is absent.  REVIEW OF SYSTEMS:  Full 14 system review of systems performed and notable only for as above All other review of systems were negative.   ALLERGIES: Allergies  Allergen Reactions   Contrast Media [Iodinated Contrast Media] Swelling    Of face, especially eyes   Gadobutrol Other (See Comments)    Felt extremities go numb briefly as contrast injected.  Had cough (bronchospasm?) briefly after scan.  Resolved on own without treatment.  07/13/15   Gadolinium Derivatives Other (See Comments)    Felt extremities go numb briefly as contrast injected.  Had cough (bronchospasm?) briefly after scan.  Resolved on own without treatment.  07/13/15   Lidocaine Other (See Comments)    Facial swelling after dental procedure   Lovastatin     myalgias   Pravachol [Pravastatin Sodium]    Latex Rash and Other (See Comments)    and irritation    HOME MEDICATIONS: Current Outpatient Medications  Medication Sig Dispense Refill   acetaminophen (TYLENOL) 500 MG tablet Take 2 tablets (1,000 mg total) by mouth every 6 (six) hours as needed. (Patient taking differently: Take 1,000 mg by mouth every 6 (six) hours as needed for mild pain.) 30 tablet 0   aspirin 81 MG tablet Take 81 mg by mouth at bedtime.     Cholecalciferol (VITAMIN D) 50 MCG (2000 UT) tablet Take 4,000 Units by mouth daily.     CITRUS BERGAMOT PO Take by mouth.     co-enzyme Q-10 30 MG capsule Take 30 mg by mouth daily.     ezetimibe (ZETIA) 10 MG tablet Take 1 tablet (10 mg total) by mouth daily. 90 tablet 3   losartan (COZAAR) 25 MG tablet Take 1 tablet (25 mg total) by mouth daily. 90 tablet 3   metoprolol succinate (TOPROL-XL) 50 MG 24 hr tablet Take 1 tablet (50 mg total) by mouth daily. Take with or immediately following a meal. 30 tablet 6    Multiple Minerals-Vitamins (GNP CAL MAG ZINC +D3 PO) Take 1 tablet by mouth in the morning and at bedtime.     NONI, MORINDA CITRIFOLIA, PO Take 600 mg by mouth 2 (two) times daily.     PRALUENT 150 MG/ML SOAJ INJECT 150 MG INTO THE SKIN EVERY 14 (FOURTEEN) DAYS. 6 mL 3   amLODipine (NORVASC) 5 MG tablet Take 1 tablet (5 mg total) by mouth daily. 180 tablet 3   No current facility-administered medications for this visit.    PAST MEDICAL HISTORY: Past Medical History:  Diagnosis Date   Coronary artery calcification 04/05/2019   Calcium score 93rd percentile 08/2018.   Dermatitis    Hx of hepatitis C    Hypertension     PAST SURGICAL HISTORY: Past Surgical History:  Procedure Laterality Date   BUNIONECTOMY Bilateral    CESAREAN SECTION  1976   CESAREAN SECTION  1979   CLIPPING OF ATRIAL APPENDAGE  Left 04/22/2020   Procedure: CLIPPING OF  LEFT ATRIAL APPENDAGE USING ATRICURE ATRICLIPFLEX-V 35;  Surgeon: Wonda Olds, MD;  Location: [facility];  Service: Open Heart Surgery;  Laterality: Left;   CORONARY ARTERY BYPASS GRAFT N/A 04/22/2020   Procedure: CORONARY ARTERY BYPASS GRAFTING (CABGX4). USING BILATERAL MAMMARIES;  Surgeon: Wonda Olds, MD;  Location: [facility];  Service: Open Heart Surgery;  Laterality: N/A;  BIMA   LEFT HEART CATH AND CORONARY ANGIOGRAPHY N/A 04/20/2020   Procedure: LEFT HEART CATH AND CORONARY ANGIOGRAPHY;  Surgeon: Nelva Bush, MD;  Location: [facility];  Service: Cardiovascular;  Laterality: N/A;   RADIAL ARTERY HARVEST Right 04/22/2020   Procedure: RIGHT RADIAL ARTERY HARVESTING.;  Surgeon: Wonda Olds, MD;  Location: [facility];  Service: Open Heart Surgery;  Laterality: Right;   TEE WITHOUT CARDIOVERSION N/A 04/22/2020   Procedure: TRANSESOPHAGEAL ECHOCARDIOGRAM (TEE);  Surgeon: Wonda Olds, MD;  Location: [facility];  Service: Open Heart Surgery;  Laterality: N/A;   UMBILICAL HERNIA REPAIR  1981    FAMILY HISTORY: Family History  Problem  Relation Age of Onset   Cancer Mother        breast   COPD Mother    Heart disease Father    Cancer Sister        sarcoma   Hypertension Brother    Ovarian cancer Maternal Grandmother    Rheum arthritis Paternal Grandmother    Hypertension Sister     SOCIAL HISTORY: Social History   Socioeconomic History   Marital status: Divorced    Spouse name: Not on file   Number of children: Not on file   Years of education: Not on file   Highest education level: Not on file  Occupational History   Not on file  Tobacco Use   Smoking status: Never   Smokeless tobacco: Never  Vaping Use   Vaping Use: Never used  Substance and Sexual Activity   Alcohol use: Not Currently    Comment: social   Drug use: No   Sexual activity: Yes    Birth control/protection: None  Other Topics Concern   Not on file  Social History Narrative   Not on file   Social Determinants of Health   Financial Resource Strain: Low Risk    Difficulty of Paying Living Expenses: Not hard at all  Food Insecurity: No Food Insecurity   Worried About Charity fundraiser in the Last Year: Never true   [place] in the Last Year: Never true  Transportation Needs: No Transportation Needs   Lack of Transportation (Medical): No   Lack of Transportation (Non-Medical): No  Physical Activity: Inactive   Days of Exercise per Week: 0 days   Minutes of Exercise per Session: 0 min  Stress: No Stress Concern Present   Feeling of Stress : Only a little  Social Connections: Not on file  Intimate Partner Violence: Not on file      Marcial Pacas, M.D. Ph.D.  [facility] [address] [place], [place] [address] Ph: [phone] Fax: [phone]  CC:  Troy Sine, MD [address] [place],  [place] [address]  Glendale Chard, MD

## 2021-07-16 LAB — RPR: RPR Ser Ql: NONREACTIVE

## 2021-07-16 LAB — VITAMIN B12: Vitamin B-12: 202 pg/mL — ABNORMAL LOW (ref 232–1245)

## 2021-07-19 ENCOUNTER — Telehealth: Payer: Self-pay | Admitting: Neurology

## 2021-07-19 LAB — VITAMIN B12: Vitamin B-12: 193 pg/mL — ABNORMAL LOW (ref 232–1245)

## 2021-07-19 LAB — PROTEIN ELECTROPHORESIS, SERUM
A/G Ratio: 1.2 (ref 0.7–1.7)
Albumin ELP: 3.8 g/dL (ref 2.9–4.4)
Alpha 1: 0.2 g/dL (ref 0.0–0.4)
Alpha 2: 0.8 g/dL (ref 0.4–1.0)
Beta: 0.8 g/dL (ref 0.7–1.3)
Gamma Globulin: 1.2 g/dL (ref 0.4–1.8)
Globulin, Total: 3.1 g/dL (ref 2.2–3.9)
Total Protein: 6.9 g/dL (ref 6.0–8.5)

## 2021-07-19 LAB — VITAMIN D 25 HYDROXY (VIT D DEFICIENCY, FRACTURES): Vit D, 25-Hydroxy: 31.3 ng/mL (ref 30.0–100.0)

## 2021-07-19 LAB — RPR: RPR Ser Ql: NONREACTIVE

## 2021-07-19 LAB — TSH: TSH: 1.11 u[IU]/mL (ref 0.450–4.500)

## 2021-07-19 NOTE — Telephone Encounter (Signed)
Please call patient, laboratory showed decreased B12 level 202, normal RPR  She would benefit B12 IM supplement, may get it from his primary care's office or our office,

## 2021-07-19 NOTE — Telephone Encounter (Signed)
I called patient.  I discussed her lab results and recommendations.  Patient will follow-up with her PCP to discuss vitamin B12 IM injections.  Patient verbalized understanding of results and had no further questions at this time.

## 2021-07-20 ENCOUNTER — Encounter: Payer: Self-pay | Admitting: Internal Medicine

## 2021-07-27 ENCOUNTER — Ambulatory Visit (INDEPENDENT_AMBULATORY_CARE_PROVIDER_SITE_OTHER): Payer: Medicare HMO

## 2021-07-27 ENCOUNTER — Telehealth: Payer: Medicare HMO

## 2021-07-27 ENCOUNTER — Ambulatory Visit: Payer: Medicare HMO

## 2021-07-27 ENCOUNTER — Telehealth: Payer: Self-pay | Admitting: Neurology

## 2021-07-27 DIAGNOSIS — E785 Hyperlipidemia, unspecified: Secondary | ICD-10-CM

## 2021-07-27 DIAGNOSIS — I2510 Atherosclerotic heart disease of native coronary artery without angina pectoris: Secondary | ICD-10-CM

## 2021-07-27 DIAGNOSIS — I1310 Hypertensive heart and chronic kidney disease without heart failure, with stage 1 through stage 4 chronic kidney disease, or unspecified chronic kidney disease: Secondary | ICD-10-CM

## 2021-07-27 DIAGNOSIS — F439 Reaction to severe stress, unspecified: Secondary | ICD-10-CM

## 2021-07-27 NOTE — Patient Instructions (Signed)
Visit Information  Thank you for taking time to visit with me today. Please don't hesitate to contact me if I can be of assistance to you before our next scheduled telephone appointment.  Following are the goals we discussed today:  (Copy and paste patient goals from clinical care plan here)  Our next appointment is by telephone on 11/25/21 at 11:15 AM  Please call the care guide team at [phone] if you need to cancel or reschedule your appointment.   If you are experiencing a Mental Health or [place] or need someone to talk to, please call [phone]-TALK (toll free, 24 hour hotline)   The patient verbalized understanding of instructions, educational materials, and care plan provided today and agreed to receive a mailed copy of patient instructions, educational materials, and care plan.    Barb Merino, RN, BSN, CCM Care Management Coordinator [facility]  Direct Phone: [phone]

## 2021-07-27 NOTE — Telephone Encounter (Signed)
Spoke with nursing case manager Barb Merino, RN regarding patient and request for psychiatry referral. Patient is requesting to be referred to [facility] for psychotherapy that Dr. Krista Blue had mentioned in her last office note on 07/15/21

## 2021-07-27 NOTE — Chronic Care Management (AMB) (Signed)
Chronic Care Management   CCM RN Visit Note  07/27/2021 Name: Virginia Delgado MRN: [id number] DOB: 1946-01-31  Subjective: Virginia Delgado is a 76 y.o. year old female who is a primary care patient of Glendale Chard, MD. The care management team was consulted for assistance with disease management and care coordination needs.    Engaged with patient by telephone for follow up visit in response to provider referral for case management and/or care coordination services.   Consent to Services:  The patient was given information about Chronic Care Management services, agreed to services, and gave verbal consent prior to initiation of services.  Please see initial visit note for detailed documentation.   Patient agreed to services and verbal consent obtained.   Assessment: Review of patient past medical history, allergies, medications, health status, including review of consultants reports, laboratory and other test data, was performed as part of comprehensive evaluation and provision of chronic care management services.   SDOH (Social Determinants of Health) assessments and interventions performed:    CCM Care Plan  Allergies  Allergen Reactions   Contrast Media [Iodinated Contrast Media] Swelling    Of face, especially eyes   Gadobutrol Other (See Comments)    Felt extremities go numb briefly as contrast injected.  Had cough (bronchospasm?) briefly after scan.  Resolved on own without treatment.  07/13/15   Gadolinium Derivatives Other (See Comments)    Felt extremities go numb briefly as contrast injected.  Had cough (bronchospasm?) briefly after scan.  Resolved on own without treatment.  07/13/15   Lidocaine Other (See Comments)    Facial swelling after dental procedure   Lovastatin     myalgias   Pravachol [Pravastatin Sodium]    Latex Rash and Other (See Comments)    and irritation    Outpatient Encounter Medications as of 07/27/2021  Medication Sig   acetaminophen (TYLENOL) 500 MG  tablet Take 2 tablets (1,000 mg total) by mouth every 6 (six) hours as needed. (Patient taking differently: Take 1,000 mg by mouth every 6 (six) hours as needed for mild pain.)   amLODipine (NORVASC) 5 MG tablet Take 1 tablet (5 mg total) by mouth daily.   aspirin 81 MG tablet Take 81 mg by mouth at bedtime.   Cholecalciferol (VITAMIN D) 50 MCG (2000 UT) tablet Take 4,000 Units by mouth daily.   CITRUS BERGAMOT PO Take by mouth.   co-enzyme Q-10 30 MG capsule Take 30 mg by mouth daily.   ezetimibe (ZETIA) 10 MG tablet Take 1 tablet (10 mg total) by mouth daily.   losartan (COZAAR) 25 MG tablet Take 1 tablet (25 mg total) by mouth daily.   metoprolol succinate (TOPROL-XL) 50 MG 24 hr tablet Take 1 tablet (50 mg total) by mouth daily. Take with or immediately following a meal.   Multiple Minerals-Vitamins (GNP CAL MAG ZINC +D3 PO) Take 1 tablet by mouth in the morning and at bedtime.   NONI, MORINDA CITRIFOLIA, PO Take 600 mg by mouth 2 (two) times daily.   PRALUENT 150 MG/ML SOAJ INJECT 150 MG INTO THE SKIN EVERY 14 (FOURTEEN) DAYS.   No facility-administered encounter medications on file as of 07/27/2021.    Patient Active Problem List   Diagnosis Date Noted   Cognitive impairment 07/15/2021   Acute kidney injury superimposed on chronic kidney disease ([place]) 07/18/2020   Hypertensive urgency 07/17/2020   S/P CABG x 4 04/26/2020   Polyarthritis with positive rheumatoid factor ([place]) 02/10/2020   Statin intolerance 11/26/2019  Coronary artery disease due to lipid rich plaque 04/05/2019   Hypertensive heart and renal disease 03/12/2018   Chronic renal disease, stage II 03/12/2018   Estrogen deficiency 03/12/2018   Hyperlipidemia LDL goal <70 10/20/2017   Menopause 07/19/2011   Essential hypertension    Hepatitis C     Conditions to be addressed/monitored: Hypertensive heart and renal disease with renal failure, stage 1 through stage 4 or unspecified chronic kidney disease, without heart  failure; Coronary artery disease due to lipid rich plaque, Hyperlipidemia   Care Plan : [place] of Care  Updates made by Lynne Logan, RN since 07/27/2021 12:00 AM     Problem: Chronic disease education and Care Coordination needs for Hypertensive heart and renal disease with renal failure, stage 1 through stage 4 or unspecified chronic kidney disease, without heart failure; Coronary artery disease due to lipid rich plaque   Priority: High     Long-Range Goal: Assist with Chronic disease education and Care Coordination needs for Hypertensive heart and renal disease with renal failure, stage 1 through stage 4 or unspecified chronic kidney disease; Coronary artery disease due to lipid rich plaque   Start Date: 12/22/2020  Expected End Date: 12/22/2021  Recent Progress: On track  Priority: High  Note:   Current Barriers:  Knowledge Deficits related to plan of care for management of Hypertensive heart and renal disease with renal failure, stage 1 through stage 4 or unspecified chronic kidney disease, without heart failure; Coronary artery disease due to lipid rich plaque Chronic Disease Management support and education needs related to Hypertensive heart and renal disease with renal failure, stage 1 through stage 4 or unspecified chronic kidney disease, without heart failure; Coronary artery disease due to lipid rich plaque  RNCM Clinical Goal(s):  Patient will demonstrate Ongoing health management independence   continue to work with RN Care Manager to address care management and care coordination needs related to  Hypertensive heart and renal disease with renal failure, stage 1 through stage 4 or unspecified chronic kidney disease, without heart failure; Coronary artery disease due to lipid rich plaque will demonstrate ongoing self health care management ability    through collaboration with RN Care manager, provider, and care team.   Interventions: 1:1 collaboration with  primary care provider regarding development and update of comprehensive plan of care as evidenced by provider attestation and co-signature Inter-disciplinary care team collaboration (see longitudinal plan of care) Evaluation of current treatment plan related to  self management and patient's adherence to plan as established by provider  Hypertension Interventions:  (Status:  Goal on track:  Yes.) Long Term Goal Last practice recorded BP readings:  BP Readings from Last 3 Encounters:  07/15/21 (!) 157/80  07/14/21 130/82  05/21/21 140/80   Most recent eGFR/CrCl:  Lab Results  Component Value Date   EGFR 59 07/06/2021    No components found for: CRCL Evaluation of current treatment plan related to hypertension self management and patient's adherence to plan as established by provider Provided education to patient re: stroke prevention, s/s of heart attack and stroke Advised patient, providing education and rationale, to monitor blood pressure daily and record, calling PCP for findings outside established parameters Provided education on prescribed diet low Sodium Discussed complications of poorly controlled blood pressure such as heart disease, stroke, circulatory complications, vision complications, kidney impairment, sexual dysfunction  CAD Interventions: (Status: Condition stable. Not addressed during this visit. ) Long Term Goal Assessed understanding of CAD diagnosis Medications reviewed  including medications utilized in CAD treatment plan including recent start of ezetimibe (zetia) 10 mg once daily Reviewed importance of taking medications exactly as prescribed  Provided education on importance of blood pressure control in management of CAD Counseled on importance of regular laboratory monitoring as prescribed Discussed plans with patient for ongoing care management follow up and provided patient with direct contact information for care management team Lipid Panel     Component  Value Date/Time   CHOL 221 (H) 12/30/2020 1044   TRIG 86 12/30/2020 1044   HDL 73 12/30/2020 1044   CHOLHDL 3.0 12/30/2020 1044   LDLCALC 133 (H) 12/30/2020 1044   LABVLDL 15 12/30/2020 1044     Dementia:  (Status:  Goal on track:  Yes.)  Long Term Goal Evaluation of current treatment plan related to cognitive and memory changes and self management and patient's adherence to plan as established by provider Determined patient completed her follow up with Dr. Krista Blue with the following Assessment/Plan noted: ASSESSMENT AND PLAN Chaniyah Wailes is a 76 y.o. female   Mild cognitive impairment             MoCA examination 21/30             Happened after CABG surgery in February 2022,             Deny a family history of dementia             MRI of the brain showed small vessel disease             Laboratory evaluation to rule out treatable etiology             Encouraged to continue moderate exercise, seeking psychotherapy for excessive stress,             Return to clinic for new issues Determined patient will receive B12 injections per Dr. Baird Cancer, she will not be available to start these injections until after 08/10/21, message sent to PCP Educated patient on the importance to receive B12 via injectable to quickly raise levels, she plans to start an oral B12 supplement, PCP notified  Determined patient would like referral for behavioral health for counseling due to personal stress Placed outbound call to Dr. Krista Blue, spoke with [facility] regarding patient request for referral to [facility] Educated patient on referral process, instructed patient to call this RN CM if she does not receive a call from [facility] within 2 weeks  Discussed plans with patient for ongoing care management follow up and provided patient with direct contact information for care management team   Patient Goals/Self-Care Activities: Take all medications as prescribed Attend all scheduled provider  appointments Call pharmacy for medication refills 3-7 days in advance of running out of medications Attend church or other social activities Perform all self care activities independently  Perform IADL's (shopping, preparing meals, housekeeping, managing finances) independently Call provider office for new concerns or questions  keep a blood pressure log take blood pressure log to all doctor appointments call doctor for signs and symptoms of high blood pressure take medications for blood pressure exactly as prescribed report new symptoms to your doctor adhere to prescribed diet: low Sodium develop an exercise routine Follow up with Neurology for evaluation of change in memory and cognition  Follow Up Plan:  Telephone follow up appointment with care management team member scheduled for:  11/25/21     Barb Merino, RN, BSN, CCM Care Management Coordinator [facility] Care Management/[facility]  Direct Phone: [phone]

## 2021-07-27 NOTE — Telephone Encounter (Signed)
Spoke w/ Dr. Krista Blue. She approved placing referral. I placed.

## 2021-07-28 ENCOUNTER — Telehealth: Payer: Self-pay | Admitting: Neurology

## 2021-07-28 DIAGNOSIS — I10 Essential (primary) hypertension: Secondary | ICD-10-CM

## 2021-07-28 DIAGNOSIS — F0390 Unspecified dementia without behavioral disturbance: Secondary | ICD-10-CM | POA: Diagnosis not present

## 2021-07-28 DIAGNOSIS — R69 Illness, unspecified: Secondary | ICD-10-CM | POA: Diagnosis not present

## 2021-07-28 DIAGNOSIS — I2510 Atherosclerotic heart disease of native coronary artery without angina pectoris: Secondary | ICD-10-CM | POA: Diagnosis not present

## 2021-07-28 NOTE — Telephone Encounter (Signed)
Referral for Psychiatry sent to [facility] [phone].

## 2021-08-02 DIAGNOSIS — M1712 Unilateral primary osteoarthritis, left knee: Secondary | ICD-10-CM | POA: Diagnosis not present

## 2021-08-02 DIAGNOSIS — Z791 Long term (current) use of non-steroidal anti-inflammatories (NSAID): Secondary | ICD-10-CM | POA: Diagnosis not present

## 2021-08-03 ENCOUNTER — Ambulatory Visit (INDEPENDENT_AMBULATORY_CARE_PROVIDER_SITE_OTHER): Payer: Medicare HMO

## 2021-08-03 VITALS — BP 118/80 | HR 62 | Temp 98.1°F | Ht 60.0 in | Wt 132.0 lb

## 2021-08-03 DIAGNOSIS — H26493 Other secondary cataract, bilateral: Secondary | ICD-10-CM | POA: Diagnosis not present

## 2021-08-03 DIAGNOSIS — E538 Deficiency of other specified B group vitamins: Secondary | ICD-10-CM | POA: Diagnosis not present

## 2021-08-03 MED ORDER — CYANOCOBALAMIN 1000 MCG/ML IJ SOLN
1000.0000 ug | Freq: Once | INTRAMUSCULAR | Status: AC
  Administered 2021-08-03: 1000 ug via INTRAMUSCULAR

## 2021-08-03 NOTE — Progress Notes (Signed)
Pt presents today for 1st b12 injection.

## 2021-08-10 ENCOUNTER — Ambulatory Visit: Payer: Medicare HMO

## 2021-08-10 VITALS — BP 118/78 | HR 60 | Temp 99.1°F

## 2021-08-10 DIAGNOSIS — E538 Deficiency of other specified B group vitamins: Secondary | ICD-10-CM

## 2021-08-10 MED ORDER — CYANOCOBALAMIN 1000 MCG/ML IJ SOLN
1000.0000 ug | Freq: Once | INTRAMUSCULAR | Status: AC
  Administered 2021-08-24: 1000 ug via INTRAMUSCULAR

## 2021-08-10 NOTE — Progress Notes (Signed)
Patient presents today for 2nd b12 injection.  

## 2021-08-17 ENCOUNTER — Ambulatory Visit (INDEPENDENT_AMBULATORY_CARE_PROVIDER_SITE_OTHER): Payer: Medicare HMO

## 2021-08-17 VITALS — BP 120/64 | HR 57 | Temp 98.9°F | Ht 60.0 in | Wt 132.0 lb

## 2021-08-17 DIAGNOSIS — Z780 Asymptomatic menopausal state: Secondary | ICD-10-CM | POA: Diagnosis not present

## 2021-08-17 DIAGNOSIS — M810 Age-related osteoporosis without current pathological fracture: Secondary | ICD-10-CM | POA: Diagnosis not present

## 2021-08-17 DIAGNOSIS — M85851 Other specified disorders of bone density and structure, right thigh: Secondary | ICD-10-CM | POA: Diagnosis not present

## 2021-08-17 DIAGNOSIS — E538 Deficiency of other specified B group vitamins: Secondary | ICD-10-CM | POA: Diagnosis not present

## 2021-08-17 DIAGNOSIS — Z1231 Encounter for screening mammogram for malignant neoplasm of breast: Secondary | ICD-10-CM | POA: Diagnosis not present

## 2021-08-17 DIAGNOSIS — M85852 Other specified disorders of bone density and structure, left thigh: Secondary | ICD-10-CM | POA: Diagnosis not present

## 2021-08-17 LAB — HM DEXA SCAN

## 2021-08-17 MED ORDER — CYANOCOBALAMIN 1000 MCG/ML IJ SOLN
1000.0000 ug | Freq: Once | INTRAMUSCULAR | Status: AC
  Administered 2021-08-17: 1000 ug via INTRAMUSCULAR

## 2021-08-17 NOTE — Progress Notes (Signed)
Patient presents today for a 3rd vitamin b12 injection. YL,RMA

## 2021-08-18 ENCOUNTER — Ambulatory Visit: Payer: Self-pay

## 2021-08-18 DIAGNOSIS — E785 Hyperlipidemia, unspecified: Secondary | ICD-10-CM

## 2021-08-18 DIAGNOSIS — I1310 Hypertensive heart and chronic kidney disease without heart failure, with stage 1 through stage 4 chronic kidney disease, or unspecified chronic kidney disease: Secondary | ICD-10-CM

## 2021-08-18 DIAGNOSIS — I2510 Atherosclerotic heart disease of native coronary artery without angina pectoris: Secondary | ICD-10-CM

## 2021-08-18 NOTE — Chronic Care Management (AMB) (Signed)
  Care Management   Follow Up Note   08/18/2021 Name: Virginia Delgado MRN: [id number] DOB: 04/09/1945   Referred by: Glendale Chard, MD Reason for referral : Care Coordination (Discipline Closure)   SW performed discipline closure.  Follow Up Plan:  No further follow up needed at this time by SW.  Daneen Schick, BSW, CDP Social Worker, Certified Dementia Practitioner [facility] [phone]

## 2021-08-19 DIAGNOSIS — E785 Hyperlipidemia, unspecified: Secondary | ICD-10-CM | POA: Diagnosis not present

## 2021-08-19 DIAGNOSIS — M1990 Unspecified osteoarthritis, unspecified site: Secondary | ICD-10-CM | POA: Diagnosis not present

## 2021-08-19 DIAGNOSIS — I2510 Atherosclerotic heart disease of native coronary artery without angina pectoris: Secondary | ICD-10-CM | POA: Diagnosis not present

## 2021-08-19 DIAGNOSIS — Z803 Family history of malignant neoplasm of breast: Secondary | ICD-10-CM | POA: Diagnosis not present

## 2021-08-19 DIAGNOSIS — Z91041 Radiographic dye allergy status: Secondary | ICD-10-CM | POA: Diagnosis not present

## 2021-08-19 DIAGNOSIS — Z8249 Family history of ischemic heart disease and other diseases of the circulatory system: Secondary | ICD-10-CM | POA: Diagnosis not present

## 2021-08-19 DIAGNOSIS — Z008 Encounter for other general examination: Secondary | ICD-10-CM | POA: Diagnosis not present

## 2021-08-19 DIAGNOSIS — I10 Essential (primary) hypertension: Secondary | ICD-10-CM | POA: Diagnosis not present

## 2021-08-19 DIAGNOSIS — Z7982 Long term (current) use of aspirin: Secondary | ICD-10-CM | POA: Diagnosis not present

## 2021-08-24 ENCOUNTER — Ambulatory Visit (INDEPENDENT_AMBULATORY_CARE_PROVIDER_SITE_OTHER): Payer: Medicare HMO

## 2021-08-24 ENCOUNTER — Encounter: Payer: Self-pay | Admitting: Internal Medicine

## 2021-08-24 VITALS — BP 112/62 | HR 85 | Temp 98.7°F | Ht 60.0 in | Wt 135.0 lb

## 2021-08-24 DIAGNOSIS — E538 Deficiency of other specified B group vitamins: Secondary | ICD-10-CM

## 2021-09-03 ENCOUNTER — Encounter: Payer: Self-pay | Admitting: Internal Medicine

## 2021-09-06 DIAGNOSIS — M1712 Unilateral primary osteoarthritis, left knee: Secondary | ICD-10-CM | POA: Diagnosis not present

## 2021-09-17 DIAGNOSIS — Z951 Presence of aortocoronary bypass graft: Secondary | ICD-10-CM | POA: Diagnosis not present

## 2021-09-17 DIAGNOSIS — R519 Headache, unspecified: Secondary | ICD-10-CM | POA: Diagnosis not present

## 2021-09-17 DIAGNOSIS — M1712 Unilateral primary osteoarthritis, left knee: Secondary | ICD-10-CM | POA: Diagnosis not present

## 2021-09-17 DIAGNOSIS — R0789 Other chest pain: Secondary | ICD-10-CM | POA: Diagnosis not present

## 2021-09-17 DIAGNOSIS — R202 Paresthesia of skin: Secondary | ICD-10-CM | POA: Diagnosis not present

## 2021-09-17 DIAGNOSIS — M7122 Synovial cyst of popliteal space [Baker], left knee: Secondary | ICD-10-CM | POA: Diagnosis not present

## 2021-09-17 DIAGNOSIS — R079 Chest pain, unspecified: Secondary | ICD-10-CM | POA: Diagnosis not present

## 2021-09-21 ENCOUNTER — Ambulatory Visit (INDEPENDENT_AMBULATORY_CARE_PROVIDER_SITE_OTHER): Payer: Medicare HMO

## 2021-09-21 VITALS — BP 132/70 | HR 62 | Temp 98.2°F

## 2021-09-21 DIAGNOSIS — E538 Deficiency of other specified B group vitamins: Secondary | ICD-10-CM | POA: Diagnosis not present

## 2021-09-21 MED ORDER — CYANOCOBALAMIN 1000 MCG/ML IJ SOLN
1000.0000 ug | Freq: Once | INTRAMUSCULAR | Status: AC
  Administered 2021-09-21: 1000 ug via INTRAMUSCULAR

## 2021-09-21 NOTE — Progress Notes (Signed)
Patient presents today for b12 injection. She will follow up with provider in 2 months.

## 2021-10-06 ENCOUNTER — Telehealth: Payer: Self-pay | Admitting: Cardiovascular Disease

## 2021-10-06 NOTE — Telephone Encounter (Signed)
  Per MyChart scheduling message:   Patient c/o Palpitations:  High priority if patient c/o lightheadedness, shortness of breath, or chest pain  How long have you had palpitations/irregular HR/ Afib? Are you having the symptoms now?   Are you currently experiencing lightheadedness, SOB or CP?   Do you have a history of afib (atrial fibrillation) or irregular heart rhythm?   Have you checked your BP or HR? (document readings if available):   Are you experiencing any other symptoms?    Irregular beatsince surgeryCAB Blood pressure normal a few intervals of 180/150 recent visit to ER Lightheaded at the time of ER visit Left calf  to toes has been swollen diagnosed with Bakers cyst behind left knee. Would appreciate Dr. Evette Georges referral to Cardio Toracic doctor in [place] would like lab results to know current levels Virginia Delgado

## 2021-10-06 NOTE — Telephone Encounter (Signed)
Left message for pt to call back  °

## 2021-10-07 NOTE — Telephone Encounter (Signed)
Patient returning call.

## 2021-10-07 NOTE — Telephone Encounter (Signed)
Spoke to patient she stated she would like to see Dr.Kelly for palpitations.Stated she has moved to [place] and wants him to refer her to a cardiologist there.Appointment scheduled with Dr.Kelly 8/14 at 4:30 pm.

## 2021-10-11 ENCOUNTER — Encounter: Payer: Self-pay | Admitting: Cardiovascular Disease

## 2021-10-11 ENCOUNTER — Ambulatory Visit: Payer: Medicare HMO | Admitting: Cardiovascular Disease

## 2021-10-11 VITALS — BP 134/72 | HR 60 | Ht 60.0 in | Wt 140.0 lb

## 2021-10-11 DIAGNOSIS — I2510 Atherosclerotic heart disease of native coronary artery without angina pectoris: Secondary | ICD-10-CM | POA: Diagnosis not present

## 2021-10-11 DIAGNOSIS — E785 Hyperlipidemia, unspecified: Secondary | ICD-10-CM | POA: Diagnosis not present

## 2021-10-11 DIAGNOSIS — R413 Other amnesia: Secondary | ICD-10-CM

## 2021-10-11 DIAGNOSIS — M25472 Effusion, left ankle: Secondary | ICD-10-CM | POA: Diagnosis not present

## 2021-10-11 DIAGNOSIS — R002 Palpitations: Secondary | ICD-10-CM | POA: Diagnosis not present

## 2021-10-11 DIAGNOSIS — M7122 Synovial cyst of popliteal space [Baker], left knee: Secondary | ICD-10-CM | POA: Diagnosis not present

## 2021-10-11 DIAGNOSIS — E538 Deficiency of other specified B group vitamins: Secondary | ICD-10-CM

## 2021-10-11 DIAGNOSIS — Z951 Presence of aortocoronary bypass graft: Secondary | ICD-10-CM | POA: Diagnosis not present

## 2021-10-11 MED ORDER — FUROSEMIDE 20 MG PO TABS: 20.0000 mg | ORAL_TABLET | Freq: Every day | ORAL | 3 refills | Status: DC | PRN

## 2021-10-11 NOTE — Patient Instructions (Signed)
Medication Instructions:  START furosemide (Lasix) 20 mg x 3 days, then AS NEEDED based on swelling  *If you need a refill on your cardiac medications before your next appointment, please call your pharmacy*  Follow-Up: At [facility], you and your health needs are our priority.  As part of our continuing mission to provide you with exceptional heart care, we have created designated Provider Care Teams.  These Care Teams include your primary Cardiologist (physician) and Advanced Practice Providers (APPs -  Physician Assistants and Nurse Practitioners) who all work together to provide you with the care you need, when you need it.  We recommend signing up for the patient portal called "MyChart".  Sign up information is provided on this After Visit Summary.  MyChart is used to connect with patients for Virtual Visits (Telemedicine).  Patients are able to view lab/test results, encounter notes, upcoming appointments, etc.  Non-urgent messages can be sent to your provider as well.   To learn more about what you can do with MyChart, go to [email or web address].    Your next appointment:   As needed with Dr. Claiborne Billings   Other Instructions You have been referred to: [facility]   Important Information About [place]

## 2021-10-11 NOTE — Progress Notes (Unsigned)
Cardiology Office Note    Date:  10/14/2021   ID:  Whitnee Calia, DOB 1945-09-28, MRN [id number]  PCP:  Glendale Chard, MD  Cardiologist:  Shelva Majestic, MD   41-monthfollow-up evaluation  History of Present Illness:  Quanna Chanthavong is a 76y.o. female who I had seen for initial evaluation on May 21, 2021 after she had requested a change in her prior provider.  She now presents for 575-monthollow-up evaluation.  Since her initial evaluation [place] and will need [place] seen Dr. RaOval Linsey In the past she was found to have asymptomatic coronary calcification, hypertension, hyperlipidemia, and is status posttreatment for hepatitis C.  She sees Dr. SaBaird Canceror primary care.  She had been on Edarbi and amlodipine for hypertension and remotely metoprolol was switched to carvedilol.  Renal Doppler imaging was negative for stenosis.  She developed progressive chest pain leading to her undergoing cardiac catheterization on April 20, 2020 performed by Dr. EnSaunders Revel This revealed significant three-vessel disease including ostial LAD involvement and definitive CABG revascularization was recommended.  She underwent successful CABG surgery x4 with a LIMA to LAD, RIMA to her PDA, and a sequential radial artery to her OM and ramus intermediate vessel in February 2022 by Dr. AtOrvan Seen She apparently has a history of statin intolerance.    She was evaluated by CaSande RivesPA-C multiple times since her CABG revascularization surgery.  During in April 2022 evaluation she denied any anginal symptoms but noted lightheadedness and dizziness.  A Zio patch monitor showed predominant sinus rhythm and there were 13 short runs of NSVT, the longest being 5 beats and 57 short runs of SVT, the longest being 7 beats as well as some PACs and PVCs.  She was also seen by JeColetta MemosNP in May 2022 and  due to intermittent fluttering and elevated blood pressure cardiac rehab she was started on Cardizem CD 120 mg daily in addition to metoprolol.  She was evaluated in the ER for balance issues and was hypertensive.  Her dizziness was felt to be secondary to hypertension on the brain MRI and head MRA were negative.  She last saw CaSande Rivesn July 2022.  I saw her for my initial evaluation with me on February 11, 2021 at which time she denied any chest pain.  At times she admitted to some vertigo if she raises her head off the pillow.  She denies any presyncope or syncope.  She was on amlodipine 5 mg daily, losartan 50 mg daily, metoprolol tartrate 50 mg twice a day.  During that evaluation, she admitted to occasional palpitations and she had a trigeminal rhythm.  She had mild orthostatic symptoms I recommended she reduce losartan from 50 mg down to 25 mg and with her trigeminal rhythm suggested titration of metoprolol tartrate to 75 mg in the morning and 50 mg in the evening.  Also suggested a possible ENT evaluation for vertigo.  With her statin intolerance I initiated Zetia.    She underwent an echo Doppler study on March 15, 2021 which showed normal LV function with EF 55 to 60%, mild LVH and normal diastolic parameters.  She had mildly increased pulmonary artery systolic pressure 3942.8m.  There was mild aortic sclerosis without stenosis with mild MR.  She was evaluated by ENT and was told things were fine.  Apparently she has been started on Praluent for aggressive lipid management with her statin intolerance and has continued be on Zetia.  She has been living in [place] with her family.  She is unaware of recent palpitations.  She has noticed memory issues and cognitive changes.    Since I saw her, she has now moved permanently to [place] and lives in [place].  She had undergone an ER evaluation at [facility] on August 9 with a tingling sensation in her left jaw without definitive  chest pressure.  She admits to mild swelling of her left ankle.  Her cognitive skills have read turned and apparently she was evaluated by neurology.  She was found to have low vitamin B and D and has been undergoing B12 injections.  Over the last 2 weeks she has noticed some left leg edema.  During her recent [facility] ER evaluation she was diagnosed as having a left popliteal Baker's cyst.  She is on losartan 25 mg daily, metoprolol 50 mg and amlodipine 5 mg for hypertension.  She is was getting the 12 injections weekly but now monthly.  She continues to be on Praluent and Zetia for aggressive lipid management.  She presents for evaluation.  Past Medical History:  Diagnosis Date   Coronary artery calcification 04/05/2019   Calcium score 93rd percentile 08/2018.   Dermatitis    Hx of hepatitis C    Hypertension     Past Surgical History:  Procedure Laterality Date   BUNIONECTOMY Bilateral    CESAREAN SECTION  1976   CESAREAN SECTION  1979   CLIPPING OF ATRIAL APPENDAGE Left 04/22/2020   Procedure: CLIPPING OF  LEFT ATRIAL APPENDAGE USING ATRICURE ATRICLIPFLEX-V 35;  Surgeon: Wonda Olds, MD;  Location: [facility];  Service: Open Heart Surgery;  Laterality: Left;   CORONARY ARTERY BYPASS GRAFT N/A 04/22/2020   Procedure: CORONARY ARTERY BYPASS GRAFTING (CABGX4). USING BILATERAL MAMMARIES;  Surgeon: Wonda Olds, MD;  Location: [facility];  Service: Open Heart Surgery;  Laterality: N/A;  BIMA   LEFT HEART CATH AND CORONARY ANGIOGRAPHY N/A 04/20/2020   Procedure: LEFT HEART CATH AND CORONARY ANGIOGRAPHY;  Surgeon: Nelva Bush, MD;  Location: [facility];  Service: Cardiovascular;  Laterality: N/A;   RADIAL ARTERY HARVEST Right 04/22/2020   Procedure: RIGHT RADIAL ARTERY HARVESTING.;  Surgeon: Wonda Olds, MD;  Location: [facility];  Service: Open Heart Surgery;  Laterality: Right;   TEE WITHOUT CARDIOVERSION N/A 04/22/2020   Procedure: TRANSESOPHAGEAL ECHOCARDIOGRAM (TEE);  Surgeon:  Wonda Olds, MD;  Location: [facility];  Service: Open Heart Surgery;  Laterality: N/A;   UMBILICAL HERNIA REPAIR  1981    Current Medications: Outpatient Medications Prior to Visit  Medication Sig Dispense Refill   acetaminophen (TYLENOL) 500 MG tablet Take 2 tablets (1,000 mg total) by mouth every 6 (six) hours as needed. (Patient taking differently: Take 1,000 mg by mouth every 6 (six) hours as needed for mild pain.) 30 tablet 0   amLODipine (NORVASC) 5 MG tablet Take 1 tablet (5 mg total) by mouth daily. 180 tablet 3   aspirin 81 MG tablet Take 81 mg by mouth at bedtime.     B Complex Vitamins (VITAMIN B COMPLEX 100) INJ Inject as directed every 30 (thirty) days.     Cholecalciferol (VITAMIN D) 50 MCG (2000 UT) tablet Take 4,000 Units by mouth daily.     CITRUS BERGAMOT PO Take by mouth.     co-enzyme Q-10 30  MG capsule Take 30 mg by mouth daily.     ezetimibe (ZETIA) 10 MG tablet Take 1 tablet (10 mg total) by mouth daily. 90 tablet 3   losartan (COZAAR) 25 MG tablet Take 1 tablet (25 mg total) by mouth daily. 90 tablet 3   Multiple Minerals-Vitamins (GNP CAL MAG ZINC +D3 PO) Take 1 tablet by mouth in the morning and at bedtime.     NON FORMULARY Take 1 capsule by mouth daily in the afternoon.     NONI, MORINDA CITRIFOLIA, PO Take 600 mg by mouth 2 (two) times daily.     PRALUENT 150 MG/ML SOAJ INJECT 150 MG INTO THE SKIN EVERY 14 (FOURTEEN) DAYS. 6 mL 3   metoprolol succinate (TOPROL-XL) 50 MG 24 hr tablet Take 1 tablet (50 mg total) by mouth daily. Take with or immediately following a meal. 30 tablet 6   nitroGLYCERIN (NITROSTAT) 0.4 MG SL tablet PLACE 1 TABLET UNDER THE TONGUE EVERY 5 MINUTES AS NEEDED FOR CHEST PAIN. (Patient not taking: Reported on 10/11/2021)     No facility-administered medications prior to visit.     Allergies:   Contrast media [iodinated contrast media], Gadobutrol, Gadolinium derivatives, Lidocaine, Lovastatin, Pravachol [pravastatin sodium], and Latex    Social History   Socioeconomic History   Marital status: Divorced    Spouse name: Not on file   Number of children: Not on file   Years of education: Not on file   Highest education level: Not on file  Occupational History   Not on file  Tobacco Use   Smoking status: Never   Smokeless tobacco: Never  Vaping Use   Vaping Use: Never used  Substance and Sexual Activity   Alcohol use: Not Currently    Comment: social   Drug use: No   Sexual activity: Yes    Birth control/protection: None  Other Topics Concern   Not on file  Social History Narrative   Not on file   Social Determinants of Health   Financial Resource Strain: Low Risk  (01/14/2021)   Overall Financial Resource Strain (CARDIA)    Difficulty of Paying Living Expenses: Not hard at all  Food Insecurity: No Food Insecurity (01/14/2021)   Hunger Vital Sign    Worried About Running Out of Food in the Last Year: Never true    Ran Out of Food in the Last Year: Never true  Recent Concern: Food Insecurity - Food Insecurity Present (12/01/2020)   Hunger Vital Sign    Worried About Running Out of Food in the Last Year: Sometimes true    Ran Out of Food in the Last Year: Never true  Transportation Needs: No Transportation Needs (01/14/2021)   PRAPARE - Hydrologist (Medical): No    Lack of Transportation (Non-Medical): No  Physical Activity: Inactive (01/14/2021)   Exercise Vital Sign    Days of Exercise per Week: 0 days    Minutes of Exercise per Session: 0 min  Stress: No Stress Concern Present (01/14/2021)   [place]    Feeling of Stress : Only a little  Social Connections: Not on file    Socially she was born in [place] and lived there for 11 years until she moved to [place].  She also lived in [place] before moving to the [place] area.  Family History:  The patient's family history includes  COPD in her mother; Cancer in her mother  and sister; Heart disease in her father; Hypertension in her brother and sister; Ovarian cancer in her maternal grandmother; Rheum arthritis in her paternal grandmother.   ROS General: Negative; No fevers, chills, or night sweats;  HEENT: Negative; No changes in vision or hearing, sinus congestion, difficulty swallowing Pulmonary: Negative; No cough, wheezing, shortness of breath, hemoptysis Cardiovascular: See HPI GI: Negative; No nausea, vomiting, diarrhea, or abdominal pain GU: Negative; No dysuria, hematuria, or difficulty voiding Musculoskeletal: Negative; no myalgias, joint pain, or weakness Hematologic/Oncology: Negative; no easy bruising, bleeding Endocrine: Negative; no heat/cold intolerance; no diabetes Neuro: Awareness of decreased memory and cognitive changes Skin: Negative; No rashes or skin lesions Psychiatric: Negative; No behavioral problems, depression Sleep: Negative; No snoring, daytime sleepiness, hypersomnolence, bruxism, restless legs, hypnogognic hallucinations, no cataplexy Other comprehensive 14 point system review is negative.   PHYSICAL EXAM:   VS:  BP 134/72 (BP Location: Left Arm, Patient Position: Sitting, Cuff Size: Normal)   Pulse 60   Ht 5' (1.524 m)   Wt 140 lb (63.5 kg)   SpO2 99%   BMI 27.34 kg/m     Repeat blood pressure by me was 132/72  Wt Readings from Last 3 Encounters:  10/11/21 140 lb (63.5 kg)  08/24/21 135 lb (61.2 kg)  08/17/21 132 lb (59.9 kg)    General: Alert, oriented, no distress.  Skin: normal turgor, no rashes, warm and dry HEENT: Normocephalic, atraumatic. Pupils equal round and reactive to light; sclera anicteric; extraocular muscles intact;  Nose without nasal septal hypertrophy Mouth/Parynx benign; Mallinpatti scale 3 Neck: No JVD, no carotid bruits; normal carotid upstroke Lungs: clear to ausculatation and percussion; no wheezing or rales Chest wall: without tenderness to  palpitation Heart: PMI not displaced, RRR, s1 s2 normal, 1/6 systolic murmur, no diastolic murmur, no rubs, gallops, thrills, or heaves Abdomen: soft, nontender; no hepatosplenomehaly, BS+; abdominal aorta nontender and not dilated by palpation. Back: no CVA tenderness Pulses 2+ Musculoskeletal: full range of motion, normal strength, no joint deformities Extremities: Trace to 1+ left ankle swelling.  No palpable cords.  No clubbing cyanosis, Homan's sign negative  Neurologic: grossly nonfocal; Cranial nerves grossly wnl Psychologic: Normal mood and affect    Studies/Labs Reviewed:   October 11, 2021 ECG (independently read by me): NSR at 60, LAE  May 21, 2021    ECG (independently read by me): Sinus rhythm with PAC, LAE  February 11, 2021 ECG (independently read by me):  Sinus rhythm at 58 with ventricuar trigeminal rhythm  Recent Labs:    Latest Ref Rng & Units 07/06/2021   12:00 AM 05/12/2021   12:56 PM 09/04/2020    3:04 PM  BMP  Glucose 70 - 99 mg/dL  84  106   BUN 4 - '21 15     11  18   ' Creatinine 0.5 - 1.1 1.0     1.09  0.91   BUN/Creat Ratio 12 - '28  10  20   ' Sodium 137 - 147 142     142  140   Potassium 3.5 - 5.1 mEq/L 4.0     4.0  4.0   Chloride 99 - 108 105     104  101   CO2 13 - '22 28     23  26   ' Calcium 8.7 - 10.7 9.8     10.0  10.0      This result is from an external source.        Latest Ref Rng &  Units 07/14/2021    3:11 PM 07/06/2021   12:00 AM 05/12/2021   12:56 PM  Hepatic Function  Total Protein 6.0 - 8.5 g/dL 6.9   6.8   Albumin 3.5 - 5.0  3.8     4.2   AST 13 - 35  18     16   ALT 7 - 35 U/L  9     11   Alk Phosphatase 25 - 125  64     85   Total Bilirubin 0.0 - 1.2 mg/dL   1.6      This result is from an external source.       Latest Ref Rng & Units 05/12/2021   12:56 PM 11/17/2020   10:57 AM 07/18/2020    4:03 AM  CBC  WBC 3.4 - 10.8 x10E3/uL 6.1  4.5  6.7   Hemoglobin 11.1 - 15.9 g/dL 13.2  13.1  12.5   Hematocrit 34.0 - 46.6 % 37.8   39.7  38.3   Platelets 150 - 450 x10E3/uL 201  183  205    Lab Results  Component Value Date   MCV 89 05/12/2021   MCV 88 11/17/2020   MCV 89.5 07/18/2020   Lab Results  Component Value Date   TSH 1.110 07/14/2021   Lab Results  Component Value Date   HGBA1C 5.6 11/17/2020     BNP No results found for: "BNP"  ProBNP No results found for: "PROBNP"   Lipid Panel     Component Value Date/Time   CHOL 146 05/12/2021 1256   TRIG 74 05/12/2021 1256   HDL 77 05/12/2021 1256   CHOLHDL 1.9 05/12/2021 1256   LDLCALC 55 05/12/2021 1256   LABVLDL 14 05/12/2021 1256     RADIOLOGY: No results found.   Additional studies/ records that were reviewed today include:   Conclusions: Severe three-vessel coronary artery disease, including 80-90% ostial/proximal LAD stenosis, 70-90% lesions of ostial/proximal D1, D2, and ramus intermedius, and diffuse mid/distal LCx disease of up to 80% (dominant LCx).  90% stenosis of nondominant RCA is also present. Normal left ventricular contraction with mildly elevated filling pressure.   Recommendations: Given significant three-vessel disease, including ostial LAD involvement, recommend cardiac surgery consultation for CABG.  Given rapid progression of symptoms (often present at rest), patient will be admitted for inpatient surgical evaluation. Aggressive secondary prevention, including addition of statin. Obtain echocardiogram.    Day Zio Monitor   Quality: Fair.  Baseline artifact. Predominant rhythm: sinus rhythm Average heart rate: 80 bpm Max heart rate: 131 bpm Min heart rate: 53 bpm Pauses >2.5 seconds: none Occasional PACs (3.4%).  Frequent PVCs (15.3%)  Up to 7 beats NSVT.  Maximum heart rate 190 bpm.    ASSESSMENT:    1. CAD in native artery   2. S/P CABG (coronary artery bypass graft)   3. Hyperlipidemia LDL goal <70   4. Palpitations   5. B12 deficiency   6. Memory changes   7. Baker's cyst of knee, left   8. Edema  of left ankle     PLAN:  Ms. Blakelyn Kubota is a 76 year old female who is originally from [place].  She has a history of hypertension, and developed progressive anginal symptomatology in February 2022 leading to her cardiac catheterization.  She was found to have severe multivessel CAD and ultimately underwent CABG revascularization surgery with utilization of arterial conduits including bilateral internal mammary artery and right radial artery with a LIMA to her LAD, RIMA to  left PDA, and sequential radial artery to OM and ramus intermediate vessel on April 22, 2020.  Subsequently, she has done well without anginal symptomatology.  She had issues with palpitations and dizziness.  I reviewed her monitor and subsequent office visits with several of our APP's.  When I saw her for my initial evaluation on February 11, 2021, she had a ventricular trigeminal rhythm and had  20 mm orthostatic drop in blood pressure.  At that time, I recommended slight reduction of losartan from 50 down to 25 mg and adjusted her beta-blocker regimen.  With her statin intolerance I initiated Zetia and felt she was a candidate for institution of PCSK9 inhibition.  She was started on Praluent and LDL cholesterol on May 12, 2021 was excellent at 71.  An echo Doppler study which showed normal LV function with EF 55 to 60% with normal diastolic parameters and mild LVH.  Presently, her blood pressure is stable on her regimen of amlodipine 5 mg, losartan 25 mg, and metoprolol succinate 50 mg.  She is not having any palpitations.  She denies any recurrent anginal symptomatology or exertional dyspnea.  She was recently found to have a left popliteal Baker's cyst and has begun to notice some mild swelling around her left ankle.  I am giving her a prescription for furosemide 20 mg to take for the next 3 days and if edema resolves to take as needed or otherwise perhaps once or twice per week.  She had been living with family members in [place]  and now has decided to live there permanently.  She lives in the [place] area.  She had asked for cardiology referral.  I will refer her to the cardiology clinic at [facility] in [place].  She has been undergoing B12 injections for low vitamin D level.  She was evaluated by neurology.  Her cognitive skills have improved.  She also has been taking supplemental vitamin D.  I will be available to see her anytime in the future if she returns to the area..   Medication Adjustments/Labs and Tests Ordered: Current medicines are reviewed at length with the patient today.  Concerns regarding medicines are outlined above.  Medication changes, Labs and Tests ordered today are listed in the Patient Instructions below. Patient Instructions  Medication Instructions:  START furosemide (Lasix) 20 mg x 3 days, then AS NEEDED based on swelling  *If you need a refill on your cardiac medications before your next appointment, please call your pharmacy*  Follow-Up: At [facility], you and your health needs are our priority.  As part of our continuing mission to provide you with exceptional heart care, we have created designated Provider Care Teams.  These Care Teams include your primary Cardiologist (physician) and Advanced Practice Providers (APPs -  Physician Assistants and Nurse Practitioners) who all work together to provide you with the care you need, when you need it.  We recommend signing up for the patient portal called "MyChart".  Sign up information is provided on this After Visit Summary.  MyChart is used to connect with patients for Virtual Visits (Telemedicine).  Patients are able to view lab/test results, encounter notes, upcoming appointments, etc.  Non-urgent messages can be sent to your provider as well.   To learn more about what you can do with MyChart, go to [email or web address].    Your next appointment:   As needed with Dr. Claiborne Billings   Other Instructions You have been referred to:  [facility] Cardiology  Important Information About Sugar         Signed, Shelva Majestic, MD  10/14/2021 2:42 PM    [facility] [address], [place], [place], [place]  [address] Phone: [phone]

## 2021-10-13 ENCOUNTER — Other Ambulatory Visit: Payer: Self-pay | Admitting: Cardiovascular Disease

## 2021-10-14 ENCOUNTER — Encounter: Payer: Self-pay | Admitting: Cardiovascular Disease

## 2021-10-27 NOTE — Progress Notes (Signed)
This encounter was created in error - please disregard.

## 2021-10-28 ENCOUNTER — Ambulatory Visit: Payer: Medicare HMO | Admitting: Internal Medicine

## 2021-10-29 DIAGNOSIS — M1712 Unilateral primary osteoarthritis, left knee: Secondary | ICD-10-CM | POA: Diagnosis not present

## 2021-10-29 DIAGNOSIS — M79662 Pain in left lower leg: Secondary | ICD-10-CM | POA: Diagnosis not present

## 2021-10-29 DIAGNOSIS — M7122 Synovial cyst of popliteal space [Baker], left knee: Secondary | ICD-10-CM | POA: Diagnosis not present

## 2021-10-29 DIAGNOSIS — M7989 Other specified soft tissue disorders: Secondary | ICD-10-CM | POA: Diagnosis not present

## 2021-11-03 ENCOUNTER — Ambulatory Visit (INDEPENDENT_AMBULATORY_CARE_PROVIDER_SITE_OTHER): Payer: Medicare HMO | Admitting: Internal Medicine

## 2021-11-03 ENCOUNTER — Encounter: Payer: Self-pay | Admitting: Internal Medicine

## 2021-11-03 VITALS — BP 130/64 | HR 52 | Temp 99.1°F | Ht 60.0 in | Wt 136.2 lb

## 2021-11-03 DIAGNOSIS — M816 Localized osteoporosis [Lequesne]: Secondary | ICD-10-CM | POA: Diagnosis not present

## 2021-11-03 DIAGNOSIS — R2689 Other abnormalities of gait and mobility: Secondary | ICD-10-CM

## 2021-11-03 DIAGNOSIS — E538 Deficiency of other specified B group vitamins: Secondary | ICD-10-CM | POA: Diagnosis not present

## 2021-11-03 DIAGNOSIS — R600 Localized edema: Secondary | ICD-10-CM | POA: Diagnosis not present

## 2021-11-03 DIAGNOSIS — Z2821 Immunization not carried out because of patient refusal: Secondary | ICD-10-CM

## 2021-11-03 DIAGNOSIS — Z79899 Other long term (current) drug therapy: Secondary | ICD-10-CM

## 2021-11-03 MED ORDER — CYANOCOBALAMIN 1000 MCG/ML IJ SOLN
1000.0000 ug | Freq: Once | INTRAMUSCULAR | Status: AC
  Administered 2021-11-03: 1000 ug via INTRAMUSCULAR

## 2021-11-03 NOTE — Progress Notes (Signed)
I,Jameka J Llittleton,acting as a scribe for  N , MD.,have documented all relevant documentation on the behalf of  N , MD,as directed by   N , MD while in the presence of  N , MD.    Subjective:     Patient ID: Virginia Delgado , female    DOB: 06/01/1945 , 75 y.o.   MRN: [id number]   Chief Complaint  Patient presents with   B12 Injection   Foot Swelling    HPI  Patient presents today for a vitamin b12 recheck. Patient also having some swelling in her foot. She denies recent fall/trauma. She is not sure what may have triggered her sx. Denies high sodium diet.      Past Medical History:  Diagnosis Date   Coronary artery calcification 04/05/2019   Calcium score 93rd percentile 08/2018.   Dermatitis    Hx of hepatitis C    Hypertension      Family History  Problem Relation Age of Onset   Cancer Mother        breast   COPD Mother    Heart disease Father    Cancer Sister        sarcoma   Hypertension Brother    Ovarian cancer Maternal Grandmother    Rheum arthritis Paternal Grandmother    Hypertension Sister      Current Outpatient Medications:    acetaminophen (TYLENOL) 500 MG tablet, Take 2 tablets (1,000 mg total) by mouth every 6 (six) hours as needed. (Patient taking differently: Take 1,000 mg by mouth every 6 (six) hours as needed for mild pain.), Disp: 30 tablet, Rfl: 0   aspirin 81 MG tablet, Take 81 mg by mouth at bedtime., Disp: , Rfl:    B Complex Vitamins (VITAMIN B COMPLEX 100) INJ, Inject as directed every 30 (thirty) days., Disp: , Rfl:    Cholecalciferol (VITAMIN D) 50 MCG (2000 UT) tablet, Take 4,000 Units by mouth daily., Disp: , Rfl:    CITRUS BERGAMOT PO, Take by mouth., Disp: , Rfl:    co-enzyme Q-10 30 MG capsule, Take 30 mg by mouth daily., Disp: , Rfl:    ezetimibe (ZETIA) 10 MG tablet, Take 1 tablet (10 mg total) by mouth daily., Disp: 90 tablet, Rfl: 3   furosemide (LASIX) 20 MG tablet, Take 1 tablet (20  mg total) by mouth daily as needed., Disp: 30 tablet, Rfl: 3   metoprolol succinate (TOPROL-XL) 50 MG 24 hr tablet, TAKE 1 TABLET BY MOUTH DAILY. TAKE WITH OR IMMEDIATELY FOLLOWING A MEAL., Disp: 90 tablet, Rfl: 3   Multiple Minerals-Vitamins (GNP CAL MAG ZINC +D3 PO), Take 1 tablet by mouth in the morning and at bedtime., Disp: , Rfl:    NON FORMULARY, Take 1 capsule by mouth daily in the afternoon., Disp: , Rfl:    NONI, MORINDA CITRIFOLIA, PO, Take 600 mg by mouth 2 (two) times daily., Disp: , Rfl:    PRALUENT 150 MG/ML SOAJ, INJECT 150 MG INTO THE SKIN EVERY 14 (FOURTEEN) DAYS., Disp: 6 mL, Rfl: 3   amLODipine (NORVASC) 5 MG tablet, TAKE 1 TABLET BY MOUTH EVERY DAY, Disp: 90 tablet, Rfl: 3   losartan (COZAAR) 25 MG tablet, Take 1 tablet (25 mg total) by mouth daily. (Patient not taking: Reported on 11/03/2021), Disp: 90 tablet, Rfl: 3   nitroGLYCERIN (NITROSTAT) 0.4 MG SL tablet, PLACE 1 TABLET UNDER THE TONGUE EVERY 5 MINUTES AS NEEDED FOR CHEST PAIN. (Patient not taking: Reported on 10/11/2021), Disp: , Rfl:      Allergies  Allergen Reactions   Contrast Media [Iodinated Contrast Media] Swelling    Of face, especially eyes   Gadobutrol Other (See Comments)    Felt extremities go numb briefly as contrast injected.  Had cough (bronchospasm?) briefly after scan.  Resolved on own without treatment.  07/13/15   Gadolinium Derivatives Other (See Comments)    Felt extremities go numb briefly as contrast injected.  Had cough (bronchospasm?) briefly after scan.  Resolved on own without treatment.  07/13/15   Lidocaine Other (See Comments)    Facial swelling after dental procedure   Lovastatin     myalgias   Pravachol [Pravastatin Sodium]    Latex Rash and Other (See Comments)    and irritation     Review of Systems  Constitutional: Negative.   Respiratory: Negative.    Cardiovascular: Negative.   Gastrointestinal: Negative.   Neurological: Negative.   Psychiatric/Behavioral: Negative.        Today's Vitals   11/03/21 1504  BP: 130/64  Pulse: (!) 52  Temp: 99.1 F (37.3 C)  Weight: 136 lb 3.2 oz (61.8 kg)  Height: 5' (1.524 m)  PainSc: 0-No pain   Body mass index is 26.6 kg/m.  Wt Readings from Last 3 Encounters:  11/03/21 136 lb 3.2 oz (61.8 kg)  10/11/21 140 lb (63.5 kg)  08/24/21 135 lb (61.2 kg)    BP Readings from Last 3 Encounters:  11/03/21 130/64  10/11/21 134/72  09/21/21 132/70    Objective:  Physical Exam Vitals and nursing note reviewed.  Constitutional:      Appearance: Normal appearance.  HENT:     Head: Normocephalic and atraumatic.  Eyes:     Extraocular Movements: Extraocular movements intact.  Cardiovascular:     Rate and Rhythm: Normal rate and regular rhythm.     Heart sounds: Normal heart sounds.  Pulmonary:     Effort: Pulmonary effort is normal.     Breath sounds: Normal breath sounds.  Musculoskeletal:     Right lower leg: Edema present.     Left lower leg: Edema present.     Comments: B/l ankle edema, +1  Skin:    General: Skin is warm.  Neurological:     General: No focal deficit present.     Mental Status: She is alert.  Psychiatric:        Mood and Affect: Mood normal.        Behavior: Behavior normal.      Assessment And Plan:     1. B12 deficiency Comments: She was given vitamin B12 injection x 1. I will also check her levels. She now plans to transfer her care to a [place] provider.  - Vitamin B12 - cyanocobalamin (VITAMIN B12) injection 1,000 mcg  2. Localized edema Comments: She has b/l ankle swelling, advised to wear compression hose for long drives and to elevate her feet when seated.   3. Imbalance Comments: She agrees to PT. I will refer her to [place] provider.  - Ambulatory referral to Physical Therapy  4. Localized osteoporosis without current pathological fracture Comments: Agrees to Rheum referral to a [place] provider. She wishes to discuss pharmacologic options with specialist.  -  Ambulatory referral to Rheumatology  5. Drug therapy - BMP8+EGFR  6. Herpes zoster vaccination declined   Patient was given opportunity to ask questions. Patient verbalized understanding of the plan and was able to repeat key elements of the plan. All questions were answered to their satisfaction.   I,    Shawnie Dapper, MD, have reviewed all documentation for this visit. The documentation on 11/03/21 for the exam, diagnosis, procedures, and orders are all accurate and complete.   IF YOU HAVE BEEN REFERRED TO A SPECIALIST, IT MAY TAKE 1-2 WEEKS TO SCHEDULE/PROCESS THE REFERRAL. IF YOU HAVE NOT HEARD FROM US/SPECIALIST IN TWO WEEKS, PLEASE GIVE [place] A CALL AT [phone] X [phone].   THE PATIENT IS ENCOURAGED TO PRACTICE SOCIAL DISTANCING DUE TO THE COVID-19 PANDEMIC.

## 2021-11-04 LAB — BMP8+EGFR
BUN/Creatinine Ratio: 16 (ref 12–28)
BUN: 16 mg/dL (ref 8–27)
CO2: 24 mmol/L (ref 20–29)
Calcium: 10.3 mg/dL (ref 8.7–10.3)
Chloride: 103 mmol/L (ref 96–106)
Creatinine, Ser: 1.02 mg/dL — ABNORMAL HIGH (ref 0.57–1.00)
Glucose: 101 mg/dL — ABNORMAL HIGH (ref 70–99)
Potassium: 4 mmol/L (ref 3.5–5.2)
Sodium: 142 mmol/L (ref 134–144)
eGFR: 58 mL/min/{1.73_m2} — ABNORMAL LOW (ref >59)

## 2021-11-04 LAB — VITAMIN B12: Vitamin B-12: 622 pg/mL (ref 232–1245)

## 2021-11-08 ENCOUNTER — Other Ambulatory Visit: Payer: Self-pay | Admitting: Student

## 2021-11-08 DIAGNOSIS — M1712 Unilateral primary osteoarthritis, left knee: Secondary | ICD-10-CM | POA: Diagnosis not present

## 2021-11-15 ENCOUNTER — Ambulatory Visit: Payer: Medicare HMO | Admitting: Internal Medicine

## 2021-11-15 ENCOUNTER — Other Ambulatory Visit: Payer: Self-pay | Admitting: Internal Medicine

## 2021-11-15 MED ORDER — NITROGLYCERIN 0.4 MG SL SUBL: SUBLINGUAL_TABLET | SUBLINGUAL | 0 refills | Status: AC

## 2021-11-23 ENCOUNTER — Encounter: Payer: Medicare HMO | Admitting: Internal Medicine

## 2021-11-25 ENCOUNTER — Telehealth: Payer: Medicare HMO

## 2021-12-06 ENCOUNTER — Ambulatory Visit: Payer: Self-pay

## 2021-12-06 DIAGNOSIS — M1712 Unilateral primary osteoarthritis, left knee: Secondary | ICD-10-CM | POA: Diagnosis not present

## 2021-12-06 NOTE — Patient Outreach (Signed)
  Care Coordination   Follow Up Visit Note   12/06/2021 Name: Virginia Delgado MRN: [id number] DOB: [birth year]  Virginia Delgado is a 76 y.o. year old female who sees Glendale Chard, MD for primary care. I spoke with  Virginia Delgado by phone today.  What matters to the patients health and wellness today?  Patient would like to participate in [facility] care coordination, however her home phone is having technical difficulties, her cell phone was recently lost.     Goals Addressed             This Visit's Progress    Care Coordination Activities: further follow up needed       Care Coordination Interventions: Determined patient would like to participate in [facility] care coordination program, however her home phone is having technical difficulties           SDOH assessments and interventions completed:  No     Care Coordination Interventions Activated:  Yes  Care Coordination Interventions:  Yes, provided   Follow up plan: Follow up call scheduled for 12/13/21 '@11'$ :00 AM     Encounter Outcome:  Pt. Visit Completed

## 2021-12-13 ENCOUNTER — Ambulatory Visit: Payer: Self-pay

## 2021-12-13 NOTE — Patient Outreach (Signed)
  Care Coordination   12/13/2021 Name: Gavriela Romey MRN: [id number] DOB: [birth year]   Care Coordination Outreach Attempts:  An unsuccessful telephone outreach was attempted for a scheduled appointment today.  Follow Up Plan:  Additional outreach attempts will be made to offer the patient care coordination information and services.   Encounter Outcome:  No Answer  Care Coordination Interventions Activated:  No   Care Coordination Interventions:  No, not indicated    Barb Merino, RN, BSN, CCM Care Management Coordinator [place] [facility] Direct Phone: [phone]

## 2021-12-24 DIAGNOSIS — I10 Essential (primary) hypertension: Secondary | ICD-10-CM | POA: Diagnosis not present

## 2021-12-24 DIAGNOSIS — I493 Ventricular premature depolarization: Secondary | ICD-10-CM | POA: Diagnosis not present

## 2021-12-24 DIAGNOSIS — I2510 Atherosclerotic heart disease of native coronary artery without angina pectoris: Secondary | ICD-10-CM | POA: Diagnosis not present

## 2021-12-24 DIAGNOSIS — E785 Hyperlipidemia, unspecified: Secondary | ICD-10-CM | POA: Diagnosis not present

## 2021-12-30 ENCOUNTER — Telehealth: Payer: Self-pay | Admitting: *Deleted

## 2021-12-30 NOTE — Chronic Care Management (AMB) (Signed)
  Care Coordination Note  12/30/2021 Name: Virginia Delgado MRN: [id number] DOB: 08/23/1945  Virginia Delgado is a 76 y.o. year old female who is a primary care patient of Glendale Chard, MD and is actively engaged with the care management team. I reached out to Jennise Ziska by phone today to assist with re-scheduling a follow up visit with the RN Case Manager  Follow up plan: Unsuccessful telephone outreach attempt made. A HIPAA compliant phone message was left for the patient providing contact information and requesting a return call.    [place]  Direct Dial: [phone]

## 2022-01-09 ENCOUNTER — Other Ambulatory Visit: Payer: Self-pay | Admitting: Cardiovascular Disease

## 2022-01-10 NOTE — Progress Notes (Unsigned)
  Care Coordination Note  01/10/2022 Name: Virginia Delgado MRN: [id number] DOB: 07-16-1945  Virginia Delgado is a 76 y.o. year old female who is a primary care patient of Glendale Chard, MD and is actively engaged with the care management team. I reached out to Virginia Delgado by phone today to assist with re-scheduling a follow up visit with the RN Case Manager  Follow up plan: Unsuccessful telephone outreach attempt made. A HIPAA compliant phone message was left for the patient providing contact information and requesting a return call.   [place]  Direct Dial: [phone]

## 2022-01-12 NOTE — Progress Notes (Signed)
  Care Coordination Note  01/12/2022 Name: Virginia Delgado MRN: [id number] DOB: Jun 05, 1945  Virginia Delgado is a 76 y.o. year old female who is a primary care patient of Glendale Chard, MD and is actively engaged with the care management team. I reached out to Shayli Dominik by phone today to assist with scheduling a follow up visit with the RN Case Manager  Follow up plan: Telephone appointment with care management team member scheduled for:01/31/22  [place]  Direct Dial: [phone]

## 2022-01-13 ENCOUNTER — Ambulatory Visit: Payer: Medicare HMO

## 2022-01-21 DIAGNOSIS — R42 Dizziness and giddiness: Secondary | ICD-10-CM | POA: Diagnosis not present

## 2022-01-21 DIAGNOSIS — R531 Weakness: Secondary | ICD-10-CM | POA: Diagnosis not present

## 2022-01-22 DIAGNOSIS — R600 Localized edema: Secondary | ICD-10-CM | POA: Diagnosis not present

## 2022-01-22 DIAGNOSIS — R42 Dizziness and giddiness: Secondary | ICD-10-CM | POA: Diagnosis not present

## 2022-01-22 DIAGNOSIS — I639 Cerebral infarction, unspecified: Secondary | ICD-10-CM | POA: Diagnosis not present

## 2022-01-22 DIAGNOSIS — M7122 Synovial cyst of popliteal space [Baker], left knee: Secondary | ICD-10-CM | POA: Diagnosis not present

## 2022-01-22 DIAGNOSIS — I10 Essential (primary) hypertension: Secondary | ICD-10-CM | POA: Diagnosis not present

## 2022-01-22 DIAGNOSIS — R791 Abnormal coagulation profile: Secondary | ICD-10-CM | POA: Diagnosis not present

## 2022-01-22 DIAGNOSIS — I2510 Atherosclerotic heart disease of native coronary artery without angina pectoris: Secondary | ICD-10-CM | POA: Diagnosis not present

## 2022-01-22 DIAGNOSIS — Z79899 Other long term (current) drug therapy: Secondary | ICD-10-CM | POA: Diagnosis not present

## 2022-01-22 DIAGNOSIS — I6782 Cerebral ischemia: Secondary | ICD-10-CM | POA: Diagnosis not present

## 2022-01-22 DIAGNOSIS — Z8249 Family history of ischemic heart disease and other diseases of the circulatory system: Secondary | ICD-10-CM | POA: Diagnosis not present

## 2022-01-22 DIAGNOSIS — R0602 Shortness of breath: Secondary | ICD-10-CM | POA: Diagnosis not present

## 2022-01-22 DIAGNOSIS — Z951 Presence of aortocoronary bypass graft: Secondary | ICD-10-CM | POA: Diagnosis not present

## 2022-01-22 DIAGNOSIS — M79605 Pain in left leg: Secondary | ICD-10-CM | POA: Diagnosis not present

## 2022-01-22 DIAGNOSIS — R29818 Other symptoms and signs involving the nervous system: Secondary | ICD-10-CM | POA: Diagnosis not present

## 2022-01-22 DIAGNOSIS — R7989 Other specified abnormal findings of blood chemistry: Secondary | ICD-10-CM | POA: Diagnosis not present

## 2022-01-23 DIAGNOSIS — I361 Nonrheumatic tricuspid (valve) insufficiency: Secondary | ICD-10-CM | POA: Diagnosis not present

## 2022-01-23 DIAGNOSIS — I517 Cardiomegaly: Secondary | ICD-10-CM | POA: Diagnosis not present

## 2022-01-23 DIAGNOSIS — R42 Dizziness and giddiness: Secondary | ICD-10-CM | POA: Diagnosis not present

## 2022-01-24 ENCOUNTER — Telehealth: Payer: Self-pay

## 2022-01-24 NOTE — Telephone Encounter (Signed)
Transition Care Management Follow-up Telephone Call Date of discharge and from where: 01/22/2022 [facility]  How have you been since you were released from the hospital? Pt states she is doing okay. She does have swelling in her left leg & calf. She is going to rheumatologist tomorrow morning.  Any questions or concerns? No   Items Reviewed: Did the pt receive and understand the discharge instructions provided? Yes  Medications obtained and verified? Yes  Other? No  Any new allergies since your discharge? No  Dietary orders reviewed? Yes Do you have support at home? Yes   Home Care and Equipment/Supplies: Were home health services ordered? no If so, what is the name of the agency? N/a  Has the agency set up a time to come to the patient's home? no Were any new equipment or medical supplies ordered?  No What is the name of the medical supply agency? N/a Were you able to get the supplies/equipment? no Do you have any questions related to the use of the equipment or supplies? No  Functional Questionnaire: (I = Independent and D = Dependent) ADLs: i  Bathing/Dressing- i  Meal Prep- i  Eating- i  Maintaining continence- i  Transferring/Ambulation- i  Managing Meds- i  Follow up appointments reviewed:  PCP Hospital f/u appt confirmed? No  Scheduled to see n/a on n/a @ n/a. [facility] f/u appt confirmed? No  Scheduled to see n/a on n/a @ n/a. Are transportation arrangements needed? No  If their condition worsens, is the pt aware to call PCP or go to the Emergency Dept.? Yes Was the patient provided with contact information for the PCP's office or ED? Yes Was to pt encouraged to call back with questions or concerns? Yes

## 2022-01-25 DIAGNOSIS — M2550 Pain in unspecified joint: Secondary | ICD-10-CM | POA: Diagnosis not present

## 2022-01-25 DIAGNOSIS — M810 Age-related osteoporosis without current pathological fracture: Secondary | ICD-10-CM | POA: Diagnosis not present

## 2022-01-25 DIAGNOSIS — Z758 Other problems related to medical facilities and other health care: Secondary | ICD-10-CM | POA: Diagnosis not present

## 2022-01-25 DIAGNOSIS — M159 Polyosteoarthritis, unspecified: Secondary | ICD-10-CM | POA: Diagnosis not present

## 2022-01-26 ENCOUNTER — Ambulatory Visit: Payer: Medicare HMO

## 2022-01-26 ENCOUNTER — Ambulatory Visit (INDEPENDENT_AMBULATORY_CARE_PROVIDER_SITE_OTHER): Payer: Medicare HMO

## 2022-01-26 VITALS — Ht 60.0 in | Wt 140.0 lb

## 2022-01-26 DIAGNOSIS — Z0000 Encounter for general adult medical examination without abnormal findings: Secondary | ICD-10-CM

## 2022-01-26 NOTE — Patient Instructions (Signed)
Virginia Delgado , Thank you for taking time to come for your Medicare Wellness Visit. I appreciate your ongoing commitment to your health goals. Please review the following plan we discussed and let me know if I can assist you in the future.   Screening recommendations/referrals: Colonoscopy: completed 11/02/2017, due 11/03/2027 Mammogram: completed 08/17/2021 Bone Density: completed 08/17/2021 Recommended yearly ophthalmology/optometry visit for glaucoma screening and checkup Recommended yearly dental visit for hygiene and checkup  Vaccinations: Influenza vaccine: decline Pneumococcal vaccine: completed 09/29/2018 Tdap vaccine: completed 09/29/2018, due 09/28/2028 Shingles vaccine: completed   Covid-19: 12/22/2020, 02/10/2020, 05/14/2019, 04/21/2019  Advanced directives: Please bring a copy of your POA (Power of Attorney) and/or Living Will to your next appointment.   Conditions/risks identified: none  Next appointment: Follow up in one year for your annual wellness visit    Preventive Care 65 Years and Older, Female Preventive care refers to lifestyle choices and visits with your health care provider that can promote health and wellness. What does preventive care include? A yearly physical exam. This is also called an annual well check. Dental exams once or twice a year. Routine eye exams. Ask your health care provider how often you should have your eyes checked. Personal lifestyle choices, including: Daily care of your teeth and gums. Regular physical activity. Eating a healthy diet. Avoiding tobacco and drug use. Limiting alcohol use. Practicing safe sex. Taking low-dose aspirin every day. Taking vitamin and mineral supplements as recommended by your health care provider. What happens during an annual well check? The services and screenings done by your health care provider during your annual well check will depend on your age, overall health, lifestyle risk factors, and family history of  disease. Counseling  Your health care provider may ask you questions about your: Alcohol use. Tobacco use. Drug use. Emotional well-being. Home and relationship well-being. Sexual activity. Eating habits. History of falls. Memory and ability to understand (cognition). Work and work Statistician. Reproductive health. Screening  You may have the following tests or measurements: Height, weight, and BMI. Blood pressure. Lipid and cholesterol levels. These may be checked every 5 years, or more frequently if you are over 68 years old. Skin check. Lung cancer screening. You may have this screening every year starting at age 75 if you have a 30-pack-year history of smoking and currently smoke or have quit within the past 15 years. Fecal occult blood test (FOBT) of the stool. You may have this test every year starting at age 81. Flexible sigmoidoscopy or colonoscopy. You may have a sigmoidoscopy every 5 years or a colonoscopy every 10 years starting at age 58. Hepatitis C blood test. Hepatitis B blood test. Sexually transmitted disease (STD) testing. Diabetes screening. This is done by checking your blood sugar (glucose) after you have not eaten for a while (fasting). You may have this done every 1-3 years. Bone density scan. This is done to screen for osteoporosis. You may have this done starting at age 37. Mammogram. This may be done every 1-2 years. Talk to your health care provider about how often you should have regular mammograms. Talk with your health care provider about your test results, treatment options, and if necessary, the need for more tests. Vaccines  Your health care provider may recommend certain vaccines, such as: Influenza vaccine. This is recommended every year. Tetanus, diphtheria, and acellular pertussis (Tdap, Td) vaccine. You may need a Td booster every 10 years. Zoster vaccine. You may need this after age 49. Pneumococcal 13-valent conjugate (PCV13) vaccine.  One  dose is recommended after age 6. Pneumococcal polysaccharide (PPSV23) vaccine. One dose is recommended after age 77. Talk to your health care provider about which screenings and vaccines you need and how often you need them. This information is not intended to replace advice given to you by your health care provider. Make sure you discuss any questions you have with your health care provider. Document Released: 03/13/2015 Document Revised: 11/04/2015 Document Reviewed: 12/16/2014 Elsevier Interactive Patient Education  2017 [place] Prevention in the Home Falls can cause injuries. They can happen to people of all ages. There are many things you can do to make your home safe and to help prevent falls. What can I do on the outside of my home? Regularly fix the edges of walkways and driveways and fix any cracks. Remove anything that might make you trip as you walk through a door, such as a raised step or threshold. Trim any bushes or trees on the path to your home. Use bright outdoor lighting. Clear any walking paths of anything that might make someone trip, such as rocks or tools. Regularly check to see if handrails are loose or broken. Make sure that both sides of any steps have handrails. Any raised decks and porches should have guardrails on the edges. Have any leaves, snow, or ice cleared regularly. Use sand or salt on walking paths during winter. Clean up any spills in your garage right away. This includes oil or grease spills. What can I do in the bathroom? Use night lights. Install grab bars by the toilet and in the tub and shower. Do not use towel bars as grab bars. Use non-skid mats or decals in the tub or shower. If you need to sit down in the shower, use a plastic, non-slip stool. Keep the floor dry. Clean up any water that spills on the floor as soon as it happens. Remove soap buildup in the tub or shower regularly. Attach bath mats securely with double-sided  non-slip rug tape. Do not have throw rugs and other things on the floor that can make you trip. What can I do in the bedroom? Use night lights. Make sure that you have a light by your bed that is easy to reach. Do not use any sheets or blankets that are too big for your bed. They should not hang down onto the floor. Have a firm chair that has side arms. You can use this for support while you get dressed. Do not have throw rugs and other things on the floor that can make you trip. What can I do in the kitchen? Clean up any spills right away. Avoid walking on wet floors. Keep items that you use a lot in easy-to-reach places. If you need to reach something above you, use a strong step stool that has a grab bar. Keep electrical cords out of the way. Do not use floor polish or wax that makes floors slippery. If you must use wax, use non-skid floor wax. Do not have throw rugs and other things on the floor that can make you trip. What can I do with my stairs? Do not leave any items on the stairs. Make sure that there are handrails on both sides of the stairs and use them. Fix handrails that are broken or loose. Make sure that handrails are as long as the stairways. Check any carpeting to make sure that it is firmly attached to the stairs. Fix any carpet that is loose or  worn. Avoid having throw rugs at the top or bottom of the stairs. If you do have throw rugs, attach them to the floor with carpet tape. Make sure that you have a light switch at the top of the stairs and the bottom of the stairs. If you do not have them, ask someone to add them for you. What else can I do to help prevent falls? Wear shoes that: Do not have high heels. Have rubber bottoms. Are comfortable and fit you well. Are closed at the toe. Do not wear sandals. If you use a stepladder: Make sure that it is fully opened. Do not climb a closed stepladder. Make sure that both sides of the stepladder are locked into place. Ask  someone to hold it for you, if possible. Clearly mark and make sure that you can see: Any grab bars or handrails. First and last steps. Where the edge of each step is. Use tools that help you move around (mobility aids) if they are needed. These include: Canes. Walkers. Scooters. Crutches. Turn on the lights when you go into a dark area. Replace any light bulbs as soon as they burn out. Set up your furniture so you have a clear path. Avoid moving your furniture around. If any of your floors are uneven, fix them. If there are any pets around you, be aware of where they are. Review your medicines with your doctor. Some medicines can make you feel dizzy. This can increase your chance of falling. Ask your doctor what other things that you can do to help prevent falls. This information is not intended to replace advice given to you by your health care provider. Make sure you discuss any questions you have with your health care provider. Document Released: 12/11/2008 Document Revised: 07/23/2015 Document Reviewed: 03/21/2014 Elsevier Interactive Patient Education  2017 [employer].

## 2022-01-26 NOTE — Progress Notes (Signed)
I connected with Virginia Delgado today by telephone and verified that I am speaking with the correct person using two identifiers. Location patient: home Location provider: work Persons participating in the virtual visit: [name], Glenna Durand LPN.   I discussed the limitations, risks, security and privacy concerns of performing an evaluation and management service by telephone and the availability of in person appointments. I also discussed with the patient that there may be a patient responsible charge related to this service. The patient expressed understanding and verbally consented to this telephonic visit.    Interactive audio and video telecommunications were attempted between this provider and patient, however failed, due to patient having technical difficulties OR patient did not have access to video capability.  We continued and completed visit with audio only.     Vital signs may be patient reported or missing.  Subjective:   Virginia Delgado is a 76 y.o. female who presents for Medicare Annual (Subsequent) preventive examination.  Review of Systems     Cardiac Risk Factors include: advanced age (>23mn, >>14women);dyslipidemia;hypertension     Objective:    Today's Vitals   01/26/22 1216  Weight: 140 lb (63.5 kg)  Height: 5' (1.524 m)   Body mass index is 27.34 kg/m.     01/26/2022   12:28 PM 01/14/2021    9:24 AM 07/18/2020    1:35 AM 07/18/2020    1:27 AM 07/17/2020    3:36 PM 04/20/2020    3:16 PM 01/22/2020    3:35 PM  Advanced Directives  Does Patient Have a Medical Advance Directive? Yes Yes  No  No Yes  Type of [place]     Living will;Healthcare Power of Attorney  Does patient want to make changes to medical advance directive?     Yes (ED - send information to MyChart)    Copy of HKemptonin Chart? No - copy requested No - copy requested     No - copy  requested  Would patient like information on creating a medical advance directive?   No - Patient declined   No - Patient declined     Current Medications (verified) Outpatient Encounter Medications as of 01/26/2022  Medication Sig   acetaminophen (TYLENOL) 500 MG tablet Take 2 tablets (1,000 mg total) by mouth every 6 (six) hours as needed. (Patient taking differently: Take 1,000 mg by mouth every 6 (six) hours as needed for mild pain.)   amLODipine (NORVASC) 10 MG tablet Take 1 tablet by mouth daily.   aspirin 81 MG tablet Take 81 mg by mouth at bedtime. Takes as needed   B Complex Vitamins (VITAMIN B COMPLEX 100) INJ Inject as directed every 30 (thirty) days.   calcium carbonate (OS-CAL) 1250 (500 Ca) MG chewable tablet Chew by mouth.   Cholecalciferol (VITAMIN D) 50 MCG (2000 UT) tablet Take 4,000 Units by mouth daily.   co-enzyme Q-10 30 MG capsule Take 30 mg by mouth daily.   ezetimibe (ZETIA) 10 MG tablet Take 1 tablet (10 mg total) by mouth daily.   furosemide (LASIX) 20 MG tablet TAKE 1 TABLET BY MOUTH EVERY DAY AS NEEDED   Magnesium Gluconate 550 MG TABS Take 1 tablet by mouth daily.   metoprolol succinate (TOPROL-XL) 50 MG 24 hr tablet TAKE 1 TABLET BY MOUTH DAILY. TAKE WITH OR IMMEDIATELY FOLLOWING A MEAL.   nitroGLYCERIN (NITROSTAT) 0.4 MG SL tablet PLACE 1 TABLET UNDER THE  TONGUE EVERY 5 MINUTES AS NEEDED FOR CHEST PAIN.   NONI, MORINDA CITRIFOLIA, PO Take 600 mg by mouth 2 (two) times daily.   PRALUENT 150 MG/ML SOAJ INJECT 150 MG INTO THE SKIN EVERY 14 (FOURTEEN) DAYS.   amLODipine (NORVASC) 5 MG tablet TAKE 1 TABLET BY MOUTH EVERY DAY (Patient not taking: Reported on 01/26/2022)   CITRUS BERGAMOT PO Take by mouth. (Patient not taking: Reported on 01/26/2022)   losartan (COZAAR) 25 MG tablet Take 1 tablet (25 mg total) by mouth daily. (Patient not taking: Reported on 11/03/2021)   Multiple Minerals-Vitamins (GNP CAL MAG ZINC +D3 PO) Take 1 tablet by mouth in the morning and at  bedtime. (Patient not taking: Reported on 01/26/2022)   NON FORMULARY Take 1 capsule by mouth daily in the afternoon.   No facility-administered encounter medications on file as of 01/26/2022.    Allergies (verified) Contrast media [iodinated contrast media], Gadobutrol, Gadolinium derivatives, Lidocaine, Lovastatin, Pravachol [pravastatin sodium], and Latex   History: Past Medical History:  Diagnosis Date   Coronary artery calcification 04/05/2019   Calcium score 93rd percentile 08/2018.   Dermatitis    Hx of hepatitis C    Hypertension    Past Surgical History:  Procedure Laterality Date   BUNIONECTOMY Bilateral    CESAREAN SECTION  1976   CESAREAN SECTION  1979   CLIPPING OF ATRIAL APPENDAGE Left 04/22/2020   Procedure: CLIPPING OF  LEFT ATRIAL APPENDAGE USING ATRICURE ATRICLIPFLEX-V 35;  Surgeon: Wonda Olds, MD;  Location: [facility];  Service: Open Heart Surgery;  Laterality: Left;   CORONARY ARTERY BYPASS GRAFT N/A 04/22/2020   Procedure: CORONARY ARTERY BYPASS GRAFTING (CABGX4). USING BILATERAL MAMMARIES;  Surgeon: Wonda Olds, MD;  Location: [facility];  Service: Open Heart Surgery;  Laterality: N/A;  BIMA   LEFT HEART CATH AND CORONARY ANGIOGRAPHY N/A 04/20/2020   Procedure: LEFT HEART CATH AND CORONARY ANGIOGRAPHY;  Surgeon: Nelva Bush, MD;  Location: [facility];  Service: Cardiovascular;  Laterality: N/A;   RADIAL ARTERY HARVEST Right 04/22/2020   Procedure: RIGHT RADIAL ARTERY HARVESTING.;  Surgeon: Wonda Olds, MD;  Location: [facility];  Service: Open Heart Surgery;  Laterality: Right;   TEE WITHOUT CARDIOVERSION N/A 04/22/2020   Procedure: TRANSESOPHAGEAL ECHOCARDIOGRAM (TEE);  Surgeon: Wonda Olds, MD;  Location: [facility];  Service: Open Heart Surgery;  Laterality: N/A;   UMBILICAL HERNIA REPAIR  1981   Family History  Problem Relation Age of Onset   Cancer Mother        breast   COPD Mother    Heart disease Father    Cancer Sister        sarcoma    Hypertension Brother    Ovarian cancer Maternal Grandmother    Rheum arthritis Paternal Grandmother    Hypertension Sister    Social History   Socioeconomic History   Marital status: Divorced    Spouse name: Not on file   Number of children: Not on file   Years of education: Not on file   Highest education level: Not on file  Occupational History   Not on file  Tobacco Use   Smoking status: Never   Smokeless tobacco: Never  Vaping Use   Vaping Use: Never used  Substance and Sexual Activity   Alcohol use: Not Currently    Comment: social   Drug use: No   Sexual activity: Yes    Birth control/protection: None  Other Topics Concern   Not on  file  Social History Narrative   Not on file   Social Determinants of Health   Financial Resource Strain: Low Risk  (01/26/2022)   Overall Financial Resource Strain (CARDIA)    Difficulty of Paying Living Expenses: Not hard at all  Food Insecurity: No Food Insecurity (01/26/2022)   Hunger Vital Sign    Worried About Running Out of Food in the Last Year: Never true    Ran Out of Food in the Last Year: Never true  Transportation Needs: No Transportation Needs (01/26/2022)   PRAPARE - Hydrologist (Medical): No    Lack of Transportation (Non-Medical): No  Physical Activity: Sufficiently Active (01/26/2022)   Exercise Vital Sign    Days of Exercise per Week: 5 days    Minutes of Exercise per Session: 60 min  Stress: No Stress Concern Present (01/26/2022)   [place]    Feeling of Stress : Not at all  Social Connections: Not on file    Tobacco Counseling Counseling given: Not Answered   Clinical Intake:  Pre-visit preparation completed: Yes  Pain : No/denies pain     Nutritional Status: BMI 25 -29 Overweight Nutritional Risks: None Diabetes: No  How often do you need to have someone help you when you read instructions,  pamphlets, or other written materials from your doctor or pharmacy?: 1 - Never  Diabetic? no  Interpreter Needed?: No  Information entered by :: NAllen LPN   Activities of Daily Living    01/26/2022   12:34 PM  In your present state of health, do you have any difficulty performing the following activities:  Hearing? 0  Vision? 1  Comment has cataract  Difficulty concentrating or making decisions? 1  Walking or climbing stairs? 1  Dressing or bathing? 0  Doing errands, shopping? 0  Preparing Food and eating ? N  Using the Toilet? N  In the past six months, have you accidently leaked urine? Y  Do you have problems with loss of bowel control? N  Managing your Medications? N  Managing your Finances? N  Housekeeping or managing your Housekeeping? N    Patient Care Team: Glendale Chard, MD as PCP - General (Internal Medicine) Rex Kras, Claudette Stapler, RN as [place] any recent Medical Services you may have received from other than [facility] providers in the past year (date may be approximate).     Assessment:   This is a routine wellness examination for Baljit.  Hearing/Vision screen Vision Screening - Comments:: Regular eye exams, [facility]  Dietary issues and exercise activities discussed: Current Exercise Habits: Home exercise routine, Type of exercise: walking, Time (Minutes): 60, Frequency (Times/Week): 5, Weekly Exercise (Minutes/Week): 300   Goals Addressed             This Visit's Progress    Patient Stated       01/26/2022, wants to stabilize walking       Depression Screen    01/26/2022   12:34 PM 01/14/2021    9:25 AM 01/22/2020    3:36 PM 10/21/2019    2:10 PM 09/26/2018    8:55 AM 03/12/2018   10:39 AM 01/19/2018    9:55 AM  PHQ 2/9 Scores  PHQ - 2 Score 0 0 0 0 0 0 0  PHQ- 9 Score     1      Fall Risk    01/26/2022   12:31  PM 01/14/2021    9:25 AM 01/22/2020    3:36 PM 11/12/2019   10:21 AM 09/26/2018     8:55 AM  Fall Risk   Falls in the past year? 1 0 0 0 0  Comment loses balance      Number falls in past yr: 1      Injury with Fall? 0      Risk for fall due to : History of fall(s);Impaired balance/gait;Impaired mobility;Medication side effect Medication side effect;Impaired mobility;Impaired balance/gait Medication side effect  Medication side effect  Follow up Falls evaluation completed;Education provided;Falls prevention discussed Falls evaluation completed;Education provided;Falls prevention discussed Falls evaluation completed;Education provided;Falls prevention discussed  Falls evaluation completed;Education provided;Falls prevention discussed    FALL RISK PREVENTION PERTAINING TO THE HOME:  Any stairs in or around the home? Yes  If so, are there any without handrails? No  Home free of loose throw rugs in walkways, pet beds, electrical cords, etc? Yes  Adequate lighting in your home to reduce risk of falls? Yes   ASSISTIVE DEVICES UTILIZED TO PREVENT FALLS:  Life alert? No  Use of a cane, walker or w/c? Yes  Grab bars in the bathroom? No  Shower chair or bench in shower? No  Elevated toilet seat or a handicapped toilet? No   TIMED UP AND GO:  Was the test performed? No .      Cognitive Function:      07/15/2021    1:32 PM  Montreal Cognitive Assessment   Visuospatial/ Executive (0/5) 1  Naming (0/3) 2  Attention: Read list of digits (0/2) 1  Attention: Read list of letters (0/1) 1  Attention: Serial 7 subtraction starting at 100 (0/3) 3  Language: Repeat phrase (0/2) 2  Language : Fluency (0/1) 1  Abstraction (0/2) 2  Delayed Recall (0/5) 2  Orientation (0/6) 6  Total 21  Adjusted Score (based on education) 21      01/26/2022   12:37 PM 01/14/2021    9:27 AM 01/22/2020    3:37 PM 09/26/2018    8:57 AM  6CIT Screen  What Year? 0 points 0 points 0 points 0 points  What month? 0 points 0 points 0 points 0 points  What time? 0 points 0 points 0 points 0  points  Count back from 20 0 points 0 points 0 points 0 points  Months in reverse 0 points 0 points 0 points 0 points  Repeat phrase 0 points 2 points 0 points 0 points  Total Score 0 points 2 points 0 points 0 points    Immunizations Immunization History  Administered Date(s) Administered   Hepatitis B 04/06/2011   PFIZER(Purple Top)SARS-COV-2 Vaccination 04/21/2019, 05/14/2019, 02/10/2020, 12/22/2020   Pneumococcal Conjugate-13 09/29/2018   Pneumococcal Polysaccharide-23 11/13/2013   Tdap 09/29/2018   Zoster Recombinat (Shingrix) 06/30/2017, 09/20/2017    TDAP status: Up to date  Flu Vaccine status: Declined, Education has been provided regarding the importance of this vaccine but patient still declined. Advised may receive this vaccine at local pharmacy or Health Dept. Aware to provide a copy of the vaccination record if obtained from local pharmacy or Health Dept. Verbalized acceptance and understanding.  Pneumococcal vaccine status: Up to date  Covid-19 vaccine status: Completed vaccines  Qualifies for Shingles Vaccine? Yes   Zostavax completed No   Shingrix Completed?: Yes  Screening Tests Health Maintenance  Topic Date Due   COVID-19 Vaccine (5 - 2023-24 season) 10/29/2021   Medicare Annual Wellness (AWV)  01/14/2022  INFLUENZA VACCINE  05/29/2022 (Originally 09/28/2021)   MAMMOGRAM  03/10/2022   DEXA SCAN  08/18/2023   COLONOSCOPY (Pts 45-67yr Insurance coverage will need to be confirmed)  11/03/2027   Pneumonia Vaccine 76 Years old  Completed   Hepatitis C Screening  Completed   Zoster Vaccines- Shingrix  Completed   HPV VACCINES  Aged Out    Health Maintenance  Health Maintenance Due  Topic Date Due   COVID-19 Vaccine (5 - 2023-24 season) 10/29/2021   Medicare Annual Wellness (AWV)  01/14/2022    Colorectal cancer screening: Type of screening: Colonoscopy. Completed 11/02/2017. Repeat every 10 years  Mammogram status: Completed 08/17/2021. Repeat every  year  Bone Density status: Completed 08/17/2021  Lung Cancer Screening: (Low Dose CT Chest recommended if Age 74-80years, 30 pack-year currently smoking OR have quit w/in 15years.) does not qualify.   Lung Cancer Screening Referral: no  Additional Screening:  Hepatitis C Screening: does qualify; Completed 11/17/2020  Vision Screening: Recommended annual ophthalmology exams for early detection of glaucoma and other disorders of the eye. Is the patient up to date with their annual eye exam?  Yes  Who is the provider or what is the name of the office in which the patient attends annual eye exams? [facility]If pt is not established with a provider, would they like to be referred to a provider to establish care? No .   Dental Screening: Recommended annual dental exams for proper oral hygiene  Community Resource Referral / Chronic Care Management: CRR required this visit?  No   CCM required this visit?  No      Plan:     I have personally reviewed and noted the following in the patient's chart:   Medical and social history Use of alcohol, tobacco or illicit drugs  Current medications and supplements including opioid prescriptions. Patient is not currently taking opioid prescriptions. Functional ability and status Nutritional status Physical activity Advanced directives List of other physicians Hospitalizations, surgeries, and ER visits in previous 12 months Vitals Screenings to include cognitive, depression, and falls Referrals and appointments  In addition, I have reviewed and discussed with patient certain preventive protocols, quality metrics, and best practice recommendations. A written personalized care plan for preventive services as well as general preventive health recommendations were provided to patient.     NKellie Simmering LPN   127/78/2423  Nurse Notes: none  Due to this being a virtual visit, the after visit summary with patients personalized plan was  offered to patient via mail or my-chart.  Patient would like to access on my-chart

## 2022-01-27 DIAGNOSIS — R2681 Unsteadiness on feet: Secondary | ICD-10-CM | POA: Diagnosis not present

## 2022-01-27 DIAGNOSIS — R2689 Other abnormalities of gait and mobility: Secondary | ICD-10-CM | POA: Diagnosis not present

## 2022-01-27 DIAGNOSIS — M1712 Unilateral primary osteoarthritis, left knee: Secondary | ICD-10-CM | POA: Diagnosis not present

## 2022-01-27 DIAGNOSIS — M6281 Muscle weakness (generalized): Secondary | ICD-10-CM | POA: Diagnosis not present

## 2022-01-27 DIAGNOSIS — Z951 Presence of aortocoronary bypass graft: Secondary | ICD-10-CM | POA: Diagnosis not present

## 2022-01-31 ENCOUNTER — Ambulatory Visit: Payer: Self-pay

## 2022-01-31 NOTE — Patient Instructions (Signed)
Visit Information  Thank you for taking time to visit with me today. Please don't hesitate to contact me if I can be of assistance to you.   Following are the goals we discussed today:   Goals Addressed             This Visit's Progress    COMPLETED: Care Coordination Activities: further follow up needed       Care Coordination Interventions: Determined patient has transitioned to a new PCP in [place] near her home Reviewed her initial visit with her new PCP is scheduled for 02/02/22 Sent in basket message to Dr. Baird Cancer current PCP advising of patients plan to switch to a new PCP          If you are experiencing a Mental Health or [place] or need someone to talk to, please call [phone]-TALK (toll free, 24 hour hotline)  Patient verbalizes understanding of instructions and care plan provided today and agrees to view in [place]. Active MyChart status and patient understanding of how to access instructions and care plan via MyChart confirmed with patient.     Barb Merino, RN, BSN, CCM Care Management Coordinator [facility] Care Management Direct Phone: [phone]

## 2022-01-31 NOTE — Patient Outreach (Signed)
  Care Coordination   Follow Up Visit Note   01/31/2022 Name: Virginia Delgado MRN: [id number] DOB: 1945/07/24  Virginia Delgado is a 76 y.o. year old female who sees Glendale Chard, MD for primary care. I spoke with  Kairie Jurich by phone today.  What matters to the patients health and wellness today?  Patient is switching to a new PCP in [place] near her home.     Goals Addressed             This Visit's Progress    COMPLETED: Care Coordination Activities: further follow up needed       Care Coordination Interventions: Determined patient has transitioned to a new PCP in [place] near her home Reviewed her initial visit with her new PCP is scheduled for 02/02/22 Sent in basket message to Dr. Baird Cancer current PCP advising of patients plan to switch to a new PCP          SDOH assessments and interventions completed:  No     Care Coordination Interventions:  Yes, provided   Follow up plan: No further intervention required.   Encounter Outcome:  Pt. Visit Completed

## 2022-02-01 DIAGNOSIS — R2681 Unsteadiness on feet: Secondary | ICD-10-CM | POA: Diagnosis not present

## 2022-02-01 DIAGNOSIS — R2689 Other abnormalities of gait and mobility: Secondary | ICD-10-CM | POA: Diagnosis not present

## 2022-02-01 DIAGNOSIS — M6281 Muscle weakness (generalized): Secondary | ICD-10-CM | POA: Diagnosis not present

## 2022-02-01 DIAGNOSIS — Z951 Presence of aortocoronary bypass graft: Secondary | ICD-10-CM | POA: Diagnosis not present

## 2022-02-01 DIAGNOSIS — M1712 Unilateral primary osteoarthritis, left knee: Secondary | ICD-10-CM | POA: Diagnosis not present

## 2022-02-02 DIAGNOSIS — I10 Essential (primary) hypertension: Secondary | ICD-10-CM | POA: Diagnosis not present

## 2022-02-02 DIAGNOSIS — R2689 Other abnormalities of gait and mobility: Secondary | ICD-10-CM | POA: Diagnosis not present

## 2022-02-02 DIAGNOSIS — R1084 Generalized abdominal pain: Secondary | ICD-10-CM | POA: Diagnosis not present

## 2022-02-02 DIAGNOSIS — R4189 Other symptoms and signs involving cognitive functions and awareness: Secondary | ICD-10-CM | POA: Diagnosis not present

## 2022-02-02 DIAGNOSIS — I1310 Hypertensive heart and chronic kidney disease without heart failure, with stage 1 through stage 4 chronic kidney disease, or unspecified chronic kidney disease: Secondary | ICD-10-CM | POA: Diagnosis not present

## 2022-02-02 DIAGNOSIS — N183 Chronic kidney disease, stage 3 unspecified: Secondary | ICD-10-CM | POA: Diagnosis not present

## 2022-02-04 ENCOUNTER — Encounter: Payer: Self-pay | Admitting: Cardiovascular Disease

## 2022-02-08 DIAGNOSIS — R2681 Unsteadiness on feet: Secondary | ICD-10-CM | POA: Diagnosis not present

## 2022-02-08 DIAGNOSIS — M6281 Muscle weakness (generalized): Secondary | ICD-10-CM | POA: Diagnosis not present

## 2022-02-08 DIAGNOSIS — M1712 Unilateral primary osteoarthritis, left knee: Secondary | ICD-10-CM | POA: Diagnosis not present

## 2022-02-08 DIAGNOSIS — Z951 Presence of aortocoronary bypass graft: Secondary | ICD-10-CM | POA: Diagnosis not present

## 2022-02-08 DIAGNOSIS — R2689 Other abnormalities of gait and mobility: Secondary | ICD-10-CM | POA: Diagnosis not present

## 2022-02-10 ENCOUNTER — Ambulatory Visit: Payer: Medicare HMO

## 2022-02-10 ENCOUNTER — Ambulatory Visit: Payer: Medicare HMO | Admitting: Internal Medicine

## 2022-02-11 DIAGNOSIS — M170 Bilateral primary osteoarthritis of knee: Secondary | ICD-10-CM | POA: Diagnosis not present

## 2022-02-24 IMAGING — MR MR HEAD W/O CM
10 of 11 series · 43 of 48 positions shown · non-contrast
Comparison: No pertinent prior exam.
COMPARISON: 07/13/2015

CLINICAL DATA: Ataxia and right-sided weakness

EXAM:
MRI HEAD WITHOUT CONTRAST
MRA HEAD WITHOUT CONTRAST
TECHNIQUE: Multiplanar, multi-echo pulse sequences of the brain and surrounding
structures were acquired without intravenous contrast. Angiographic
images of the Circle of Willis were acquired using MRA technique
without intravenous contrast.

[Series 9: DWI · axial · 3.0mm · 0.88mm/px · z∈[-124,+22]mm · 9 of 100 slices shown (1 of 4)]
[im 1/100]
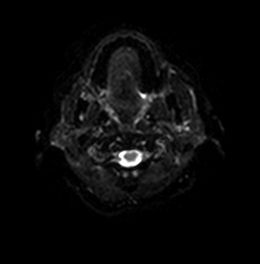
[im 13/100]
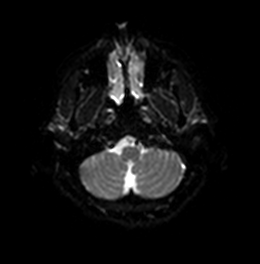
[im 25/100]
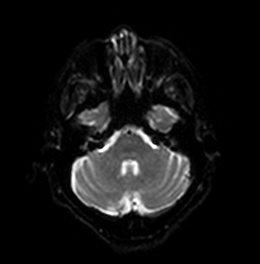
[im 38/100]
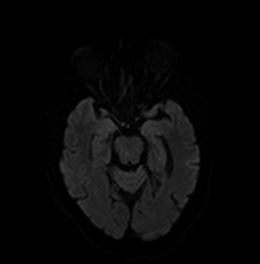
[im 50/100]
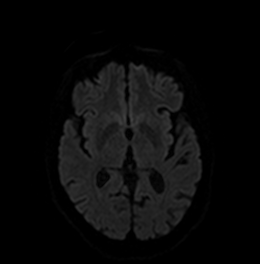
[im 62/100]
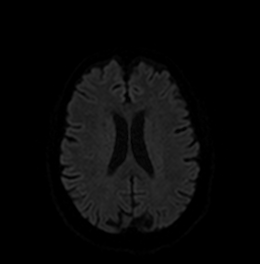
[im 75/100]
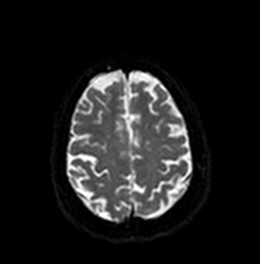
[im 87/100]
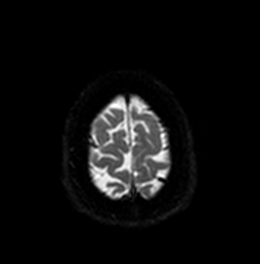
[im 100/100]
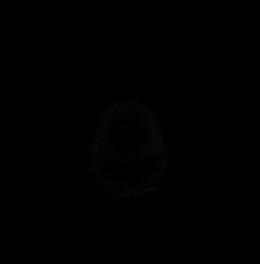

[Series 10: DWI · axial · 3.0mm · 0.88mm/px · z∈[-124,+22]mm · 5 of 50 slices shown (2 of 4)]
[im 1/50]
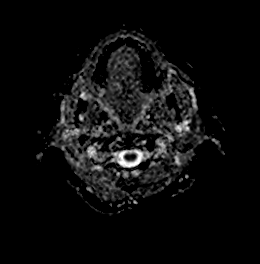
[im 13/50]
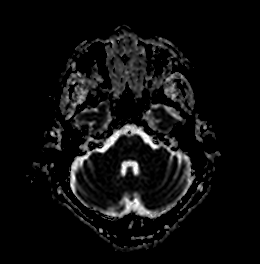
[im 25/50]
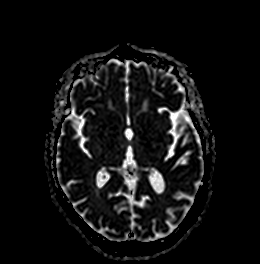
[im 37/50]
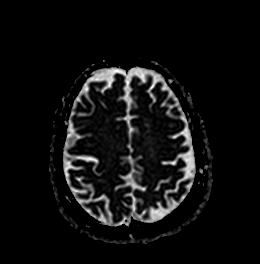
[im 50/50]
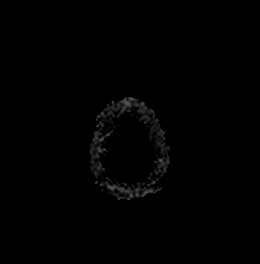

[Series 11: DWI · coronal · 4.0mm · 0.88mm/px · 7 of 72 slices shown (3 of 4)]
[im 1/72]
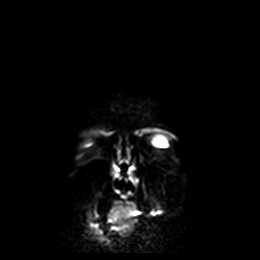
[im 12/72]
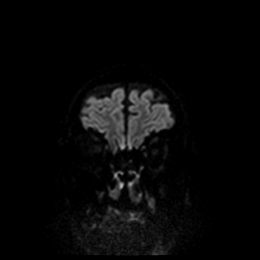
[im 24/72]
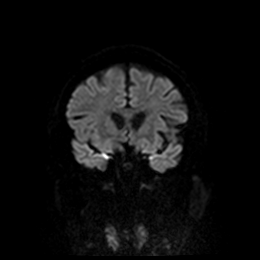
[im 36/72]
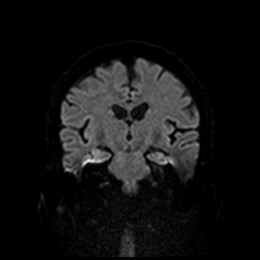
[im 48/72]
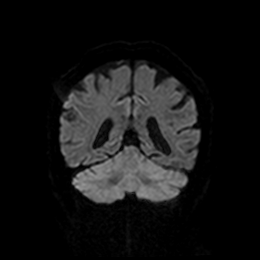
[im 60/72]
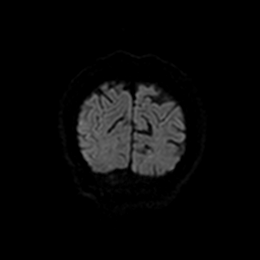
[im 72/72]
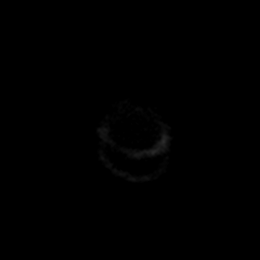

[Series 12: DWI · coronal · 4.0mm · 0.88mm/px · 3 of 36 slices shown (4 of 4)]
[im 1/36]
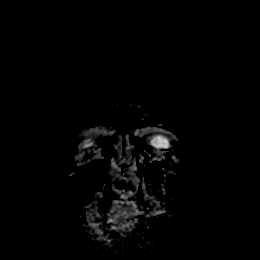
[im 18/36]
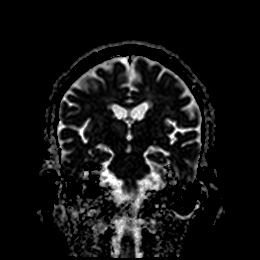
[im 36/36]
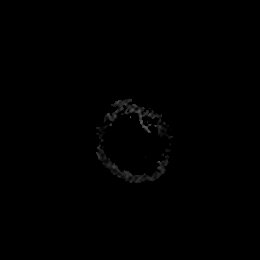

[Series 14: pha_images · axial · 3.0mm · 0.90mm/px · z∈[-128,+29]mm · 5 of 54 slices shown]
[im 1/54]
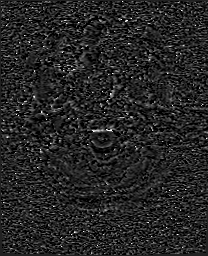
[im 14/54]
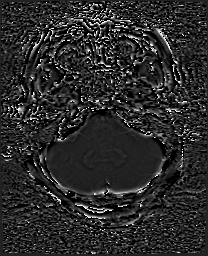
[im 27/54]
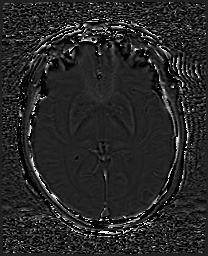
[im 40/54]
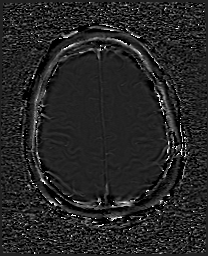
[im 54/54]
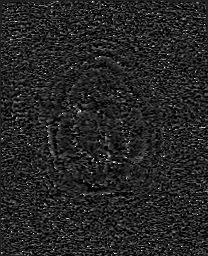

[Series 15: swi_images · axial · 3.0mm · 0.90mm/px · z∈[-128,+35]mm · 5 of 56 slices shown]
[im 1/56]
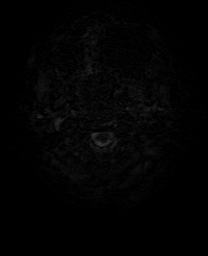
[im 14/56]
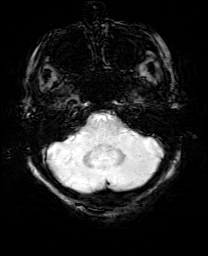
[im 28/56]
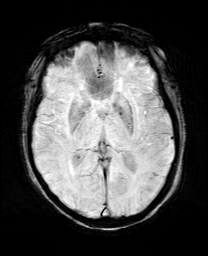
[im 42/56]
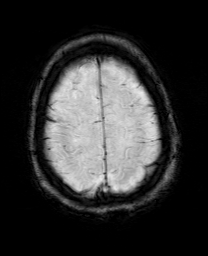
[im 56/56]
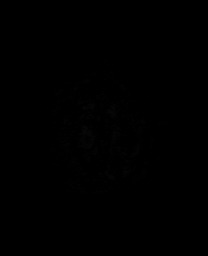

[Series 21: FLAIR · axial · 5.0mm · 0.45mm/px · z∈[-122,+21]mm · 2 of 25 slices shown]
[im 1/25]
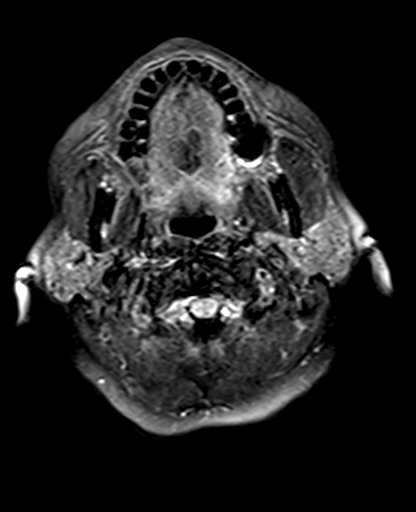
[im 25/25]
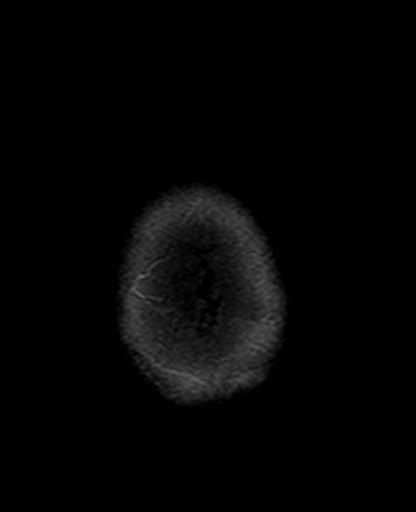

[Series 22: T2 · axial · 5.0mm · 0.72mm/px · z∈[-122,+21]mm · 2 of 25 slices shown (1 of 2)]
[im 1/25]
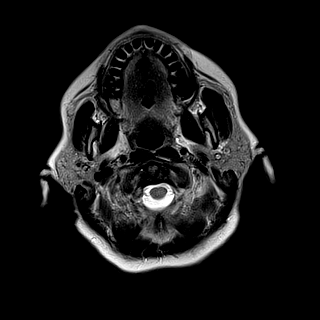
[im 25/25]
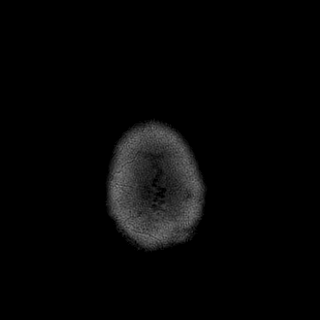

[Series 24: T1 · sagittal · 5.0mm · 0.75mm/px · 2 of 25 slices shown]
[im 1/25]
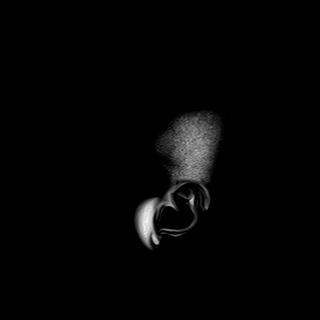
[im 25/25]
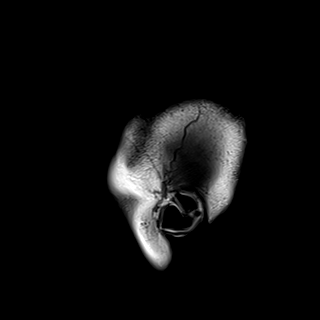

[Series 25: T2 · coronal · 5.0mm · 0.34mm/px · 3 of 29 slices shown (2 of 2)]
[im 1/29]
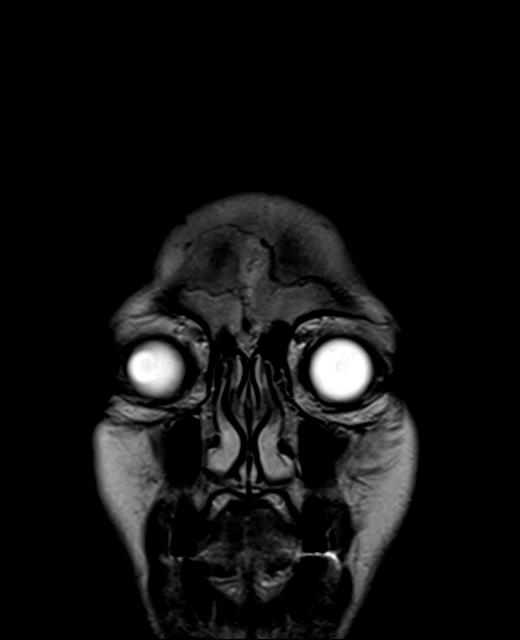
[im 15/29]
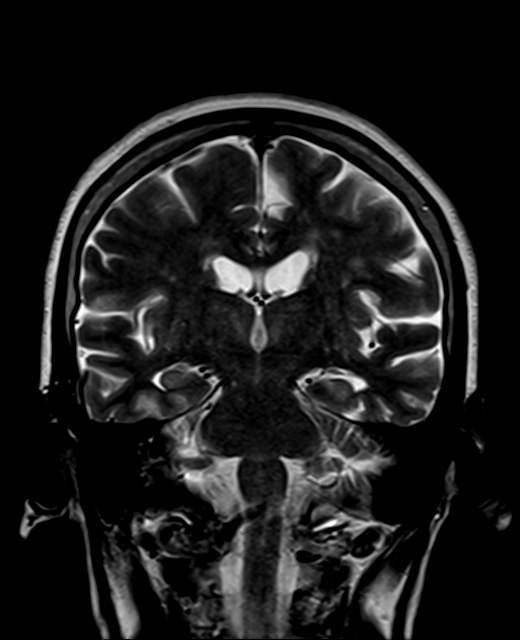
[im 29/29]
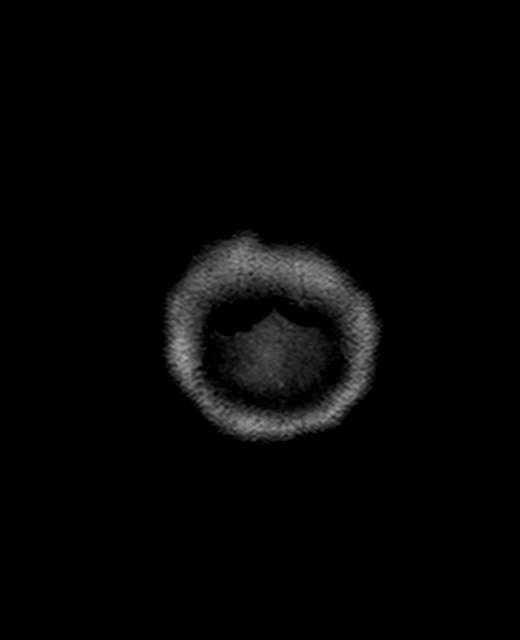

[43 of 48 positions shown; findings below may reference images not displayed]

FINDINGS: MRI HEAD FINDINGS

Brain: No acute infarct, mass effect or extra-axial collection. No
acute or chronic hemorrhage. There is multifocal hyperintense
T2-weighted signal within the white matter. Parenchymal volume and
CSF spaces are normal. The midline structures are normal.

Vascular: Major flow voids are preserved.

Skull and upper cervical spine: Normal calvarium and skull base.
Visualized upper cervical spine and soft tissues are normal.

Sinuses/Orbits:No paranasal sinus fluid levels or advanced mucosal
thickening. No mastoid or middle ear effusion. Normal orbits.

MRA HEAD FINDINGS

POSTERIOR CIRCULATION:

--Vertebral arteries: Normal right V4 segment. Left V4 segment is
again not visualized.

--Inferior cerebellar arteries: Normal.

--Basilar artery: Normal.

--Superior cerebellar arteries: Normal.

--Posterior cerebral arteries: Moderate stenosis at both P1 P2
junctions.

ANTERIOR CIRCULATION:

--Intracranial internal carotid arteries: Normal.

--Anterior cerebral arteries (ACA): Normal.

--Middle cerebral arteries (MCA): Normal.

ANATOMIC VARIANTS: Fetal origin of the right PCA.
IMPRESSION: 1. No acute intracranial abnormality.
2. Chronic small vessel ischemic changes.
3. No emergent large vessel occlusion.
4. Moderate stenosis at both PCA P1-P2 junctions.

## 2022-03-03 DIAGNOSIS — R2689 Other abnormalities of gait and mobility: Secondary | ICD-10-CM | POA: Diagnosis not present

## 2022-03-03 DIAGNOSIS — M6281 Muscle weakness (generalized): Secondary | ICD-10-CM | POA: Diagnosis not present

## 2022-03-03 DIAGNOSIS — R2681 Unsteadiness on feet: Secondary | ICD-10-CM | POA: Diagnosis not present

## 2022-03-03 DIAGNOSIS — Z951 Presence of aortocoronary bypass graft: Secondary | ICD-10-CM | POA: Diagnosis not present

## 2022-03-03 DIAGNOSIS — M1712 Unilateral primary osteoarthritis, left knee: Secondary | ICD-10-CM | POA: Diagnosis not present

## 2022-03-08 DIAGNOSIS — Z951 Presence of aortocoronary bypass graft: Secondary | ICD-10-CM | POA: Diagnosis not present

## 2022-03-08 DIAGNOSIS — R2681 Unsteadiness on feet: Secondary | ICD-10-CM | POA: Diagnosis not present

## 2022-03-08 DIAGNOSIS — M6281 Muscle weakness (generalized): Secondary | ICD-10-CM | POA: Diagnosis not present

## 2022-03-08 DIAGNOSIS — M1712 Unilateral primary osteoarthritis, left knee: Secondary | ICD-10-CM | POA: Diagnosis not present

## 2022-03-08 DIAGNOSIS — R2689 Other abnormalities of gait and mobility: Secondary | ICD-10-CM | POA: Diagnosis not present

## 2022-03-10 DIAGNOSIS — R2681 Unsteadiness on feet: Secondary | ICD-10-CM | POA: Diagnosis not present

## 2022-03-10 DIAGNOSIS — R2689 Other abnormalities of gait and mobility: Secondary | ICD-10-CM | POA: Diagnosis not present

## 2022-03-10 DIAGNOSIS — M1712 Unilateral primary osteoarthritis, left knee: Secondary | ICD-10-CM | POA: Diagnosis not present

## 2022-03-10 DIAGNOSIS — Z951 Presence of aortocoronary bypass graft: Secondary | ICD-10-CM | POA: Diagnosis not present

## 2022-03-10 DIAGNOSIS — M6281 Muscle weakness (generalized): Secondary | ICD-10-CM | POA: Diagnosis not present

## 2022-03-14 DIAGNOSIS — M170 Bilateral primary osteoarthritis of knee: Secondary | ICD-10-CM | POA: Diagnosis not present

## 2022-03-17 ENCOUNTER — Other Ambulatory Visit (HOSPITAL_COMMUNITY): Payer: Self-pay

## 2022-03-18 ENCOUNTER — Telehealth: Payer: Self-pay

## 2022-03-18 ENCOUNTER — Other Ambulatory Visit (HOSPITAL_COMMUNITY): Payer: Self-pay

## 2022-03-18 NOTE — Telephone Encounter (Signed)
Pharmacy Patient Advocate Encounter  Prior Authorization for Repatha '140mg'$ /ml has been approved.    key# [id number] Effective dates: 1.1.24 through 12.31.24  Please note that Rx Praluent is no longer a covered medication on the patients formulary

## 2022-03-22 MED ORDER — REPATHA SURECLICK 140 MG/ML ~~LOC~~ SOAJ: 140.0000 mg | SUBCUTANEOUS | 3 refills | Status: AC

## 2022-03-23 DIAGNOSIS — Z951 Presence of aortocoronary bypass graft: Secondary | ICD-10-CM | POA: Diagnosis not present

## 2022-03-23 DIAGNOSIS — M6281 Muscle weakness (generalized): Secondary | ICD-10-CM | POA: Diagnosis not present

## 2022-03-23 DIAGNOSIS — R2681 Unsteadiness on feet: Secondary | ICD-10-CM | POA: Diagnosis not present

## 2022-03-23 DIAGNOSIS — M1712 Unilateral primary osteoarthritis, left knee: Secondary | ICD-10-CM | POA: Diagnosis not present

## 2022-03-23 DIAGNOSIS — R2689 Other abnormalities of gait and mobility: Secondary | ICD-10-CM | POA: Diagnosis not present

## 2022-04-22 DIAGNOSIS — M6281 Muscle weakness (generalized): Secondary | ICD-10-CM | POA: Diagnosis not present

## 2022-04-22 DIAGNOSIS — R2681 Unsteadiness on feet: Secondary | ICD-10-CM | POA: Diagnosis not present

## 2022-04-22 DIAGNOSIS — R2689 Other abnormalities of gait and mobility: Secondary | ICD-10-CM | POA: Diagnosis not present

## 2022-04-22 DIAGNOSIS — Z951 Presence of aortocoronary bypass graft: Secondary | ICD-10-CM | POA: Diagnosis not present

## 2022-04-22 DIAGNOSIS — M1712 Unilateral primary osteoarthritis, left knee: Secondary | ICD-10-CM | POA: Diagnosis not present

## 2022-05-15 ENCOUNTER — Other Ambulatory Visit: Payer: Self-pay | Admitting: Cardiovascular Disease

## 2022-07-19 ENCOUNTER — Encounter: Payer: Self-pay | Admitting: Neurology

## 2022-07-19 ENCOUNTER — Ambulatory Visit: Payer: Medicare HMO | Admitting: Neurology

## 2022-07-19 ENCOUNTER — Ambulatory Visit: Payer: Medicare HMO | Admitting: Adult Health

## 2022-07-19 NOTE — Progress Notes (Deleted)
Patient: Virginia Delgado Date of Birth: 10-22-1945  Reason for Visit: Follow up History from: Patient Primary Neurologist: Terrace Arabia   ASSESSMENT AND PLAN 77 y.o. year old female   1.  Mild cognitive impairment   HISTORY  Virginia Delgado is a 77 year old female, seen in request by her primary care doctor Nicki Guadalajara for evaluation of memory loss, initial evaluation was on Jul 15, 2021.   I reviewed and summarized the referring note. PMHx. 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,  She has been lifting with her son in [place] since hospital discharge in May 2022,  Since open heart surgery in February 2022, she complains of brain foggy sensation, misplace things, word finding difficulties, she now exercises daily, having good appetite, sleeping well,  She does complains of increased stress with such a big life change, is seeking for psychotherapy,  [facility] examination today is 21/30  Update Jul 19, 2022 SS: Last visit May 2023 B12 level low 202  REVIEW OF SYSTEMS: Out of a complete 14 system review of symptoms, the patient complains only of the following symptoms, and all other reviewed systems are negative.  See HPI  ALLERGIES: Allergies  Allergen Reactions   Contrast Media [Iodinated Contrast Media] Swelling    Of face, especially eyes   Gadobutrol Other (See Comments)    Felt extremities go numb briefly  as contrast injected.  Had cough (bronchospasm?) briefly after scan.  Resolved on own without treatment.  07/13/15   Gadolinium Derivatives Other (See Comments)    Felt extremities go numb briefly as contrast injected.  Had cough (bronchospasm?) briefly after scan.  Resolved on own without treatment.  07/13/15   Lidocaine Other (See Comments)    Facial swelling after dental procedure   Lovastatin     myalgias   Pravachol [Pravastatin Sodium]    Latex Rash and Other (See Comments)    and irritation    HOME MEDICATIONS: Outpatient Medications Prior to Visit  Medication Sig Dispense Refill   acetaminophen (TYLENOL) 500 MG tablet Take 2 tablets (1,000 mg total) by mouth every 6 (six) hours as needed. (Patient taking differently: Take 1,000 mg by mouth every 6 (six) hours as needed for mild pain.) 30 tablet 0   amLODipine (NORVASC) 10 MG tablet Take 1 tablet by mouth daily.     amLODipine (NORVASC) 5 MG tablet TAKE 1 TABLET BY MOUTH EVERY DAY (Patient not taking: Reported on 01/26/2022) 90 tablet 3   aspirin 81 MG tablet Take 81 mg by mouth at bedtime. Takes as needed     B Complex Vitamins (VITAMIN B COMPLEX 100) INJ Inject as directed every 30 (thirty) days.     calcium carbonate (OS-CAL) 1250 (500 Ca) MG chewable tablet Chew by mouth.     Cholecalciferol (VITAMIN D) 50 MCG (2000 UT) tablet Take 4,000 Units by mouth daily.     CITRUS BERGAMOT PO Take by mouth. (Patient not taking: Reported on 01/26/2022)  co-enzyme Q-10 30 MG capsule Take 30 mg by mouth daily.     Evolocumab (REPATHA SURECLICK) 140 MG/ML SOAJ Inject 140 mg into the skin every 14 (fourteen) days. 6 mL 3   ezetimibe (ZETIA) 10 MG tablet TAKE 1 TABLET BY MOUTH EVERY DAY 90 tablet 1   furosemide (LASIX) 20 MG tablet TAKE 1 TABLET BY MOUTH EVERY DAY AS NEEDED 90 tablet 1   losartan (COZAAR) 25 MG tablet Take 1 tablet (25 mg total) by mouth daily. (Patient not taking: Reported on 11/03/2021) 90 tablet 3   Magnesium Gluconate 550 MG  TABS Take 1 tablet by mouth daily.     metoprolol succinate (TOPROL-XL) 50 MG 24 hr tablet TAKE 1 TABLET BY MOUTH DAILY. TAKE WITH OR IMMEDIATELY FOLLOWING A MEAL. 90 tablet 3   Multiple Minerals-Vitamins (GNP CAL MAG ZINC +D3 PO) Take 1 tablet by mouth in the morning and at bedtime. (Patient not taking: Reported on 01/26/2022)     nitroGLYCERIN (NITROSTAT) 0.4 MG SL tablet PLACE 1 TABLET UNDER THE TONGUE EVERY 5 MINUTES AS NEEDED FOR CHEST PAIN. 100 tablet 0   NON FORMULARY Take 1 capsule by mouth daily in the afternoon.     NONI, MORINDA CITRIFOLIA, PO Take 600 mg by mouth 2 (two) times daily.     No facility-administered medications prior to visit.    PAST MEDICAL HISTORY: Past Medical History:  Diagnosis Date   Coronary artery calcification 04/05/2019   Calcium score 93rd percentile 08/2018.   Dermatitis    Hx of hepatitis C    Hypertension     PAST SURGICAL HISTORY: Past Surgical History:  Procedure Laterality Date   BUNIONECTOMY Bilateral    CESAREAN SECTION  1976   CESAREAN SECTION  1979   CLIPPING OF ATRIAL APPENDAGE Left 04/22/2020   Procedure: CLIPPING OF  LEFT ATRIAL APPENDAGE USING ATRICURE ATRICLIPFLEX-V 35;  Surgeon: Linden Dolin, MD;  Location: [facility] OR;  Service: Open Heart Surgery;  Laterality: Left;   CORONARY ARTERY BYPASS GRAFT N/A 04/22/2020   Procedure: CORONARY ARTERY BYPASS GRAFTING (CABGX4). USING BILATERAL MAMMARIES;  Surgeon: Linden Dolin, MD;  Location: [facility] OR;  Service: Open Heart Surgery;  Laterality: N/A;  BIMA   LEFT HEART CATH AND CORONARY ANGIOGRAPHY N/A 04/20/2020   Procedure: LEFT HEART CATH AND CORONARY ANGIOGRAPHY;  Surgeon: Yvonne Kendall, MD;  Location: [facility] INVASIVE CV LAB;  Service: Cardiovascular;  Laterality: N/A;   RADIAL ARTERY HARVEST Right 04/22/2020   Procedure: RIGHT RADIAL ARTERY HARVESTING.;  Surgeon: Linden Dolin, MD;  Location: [facility] OR;  Service: Open Heart Surgery;  Laterality: Right;   TEE WITHOUT CARDIOVERSION N/A 04/22/2020    Procedure: TRANSESOPHAGEAL ECHOCARDIOGRAM (TEE);  Surgeon: Linden Dolin, MD;  Location: [facility] OR;  Service: Open Heart Surgery;  Laterality: N/A;   UMBILICAL HERNIA REPAIR  1981    FAMILY HISTORY: Family History  Problem Relation Age of Onset   Cancer Mother        breast   COPD Mother    Heart disease Father    Cancer Sister        sarcoma   Hypertension Brother    Ovarian cancer Maternal Grandmother    Rheum arthritis Paternal Grandmother    Hypertension Sister     SOCIAL HISTORY: Social History   Socioeconomic History   Marital status: Divorced    Spouse name: Not on file   Number of children: Not on file   Years of education: Not on file  Highest education level: Not on file  Occupational History   Not on file  Tobacco Use   Smoking status: Never   Smokeless tobacco: Never  Vaping Use   Vaping Use: Never used  Substance and Sexual Activity   Alcohol use: Not Currently    Comment: social   Drug use: No   Sexual activity: Yes    Birth control/protection: None  Other Topics Concern   Not on file  Social History Narrative   Not on file   Social Determinants of Health   Financial Resource Strain: Low Risk  (01/26/2022)   Overall Financial Resource Strain (CARDIA)    Difficulty of Paying Living Expenses: Not hard at all  Food Insecurity: No Food Insecurity (01/26/2022)   Hunger Vital Sign    Worried About Running Out of Food in the Last Year: Never true    Ran Out of Food in the Last Year: Never true  Transportation Needs: No Transportation Needs (01/26/2022)   PRAPARE - Administrator, Civil Service (Medical): No    Lack of Transportation (Non-Medical): No  Physical Activity: Sufficiently Active (01/26/2022)   Exercise Vital Sign    Days of Exercise per Week: 5 days    Minutes of Exercise per Session: 60 min  Stress: No Stress Concern Present (01/26/2022)   [employer] of Occupational Health - Occupational Stress Questionnaire     Feeling of Stress : Not at all  Social Connections: Not on file  Intimate Partner Violence: Not At Risk (09/26/2018)   Humiliation, Afraid, Rape, and Kick questionnaire    Fear of Current or Ex-Partner: No    Emotionally Abused: No    Physically Abused: No    Sexually Abused: No    PHYSICAL EXAM  There were no vitals filed for this visit. There is no height or weight on file to calculate BMI.  Generalized: Well developed, in no acute distress  Neurological examination  Mentation: Alert oriented to time, place, history taking. Follows all commands speech and language fluent Cranial nerve II-XII: Pupils were equal round reactive to light. Extraocular movements were full, visual field were full on confrontational test. Facial sensation and strength were normal. Uvula tongue midline. Head turning and shoulder shrug  were normal and symmetric. Motor: The motor testing reveals 5 over 5 strength of all 4 extremities. Good symmetric motor tone is noted throughout.  Sensory: Sensory testing is intact to soft touch on all 4 extremities. No evidence of extinction is noted.  Coordination: Cerebellar testing reveals good finger-nose-finger and heel-to-shin bilaterally.  Gait and station: Gait is normal. Tandem gait is normal. Romberg is negative. No drift is seen.  Reflexes: Deep tendon reflexes are symmetric and normal bilaterally.   DIAGNOSTIC DATA (LABS, IMAGING, TESTING) - I reviewed patient records, labs, notes, testing and imaging myself where available.  Lab Results  Component Value Date   WBC 6.1 05/12/2021   HGB 13.2 05/12/2021   HCT 37.8 05/12/2021   MCV 89 05/12/2021   PLT 201 05/12/2021      Component Value Date/Time   NA 142 11/03/2021 1636   K 4.0 11/03/2021 1636   CL 103 11/03/2021 1636   CO2 24 11/03/2021 1636   GLUCOSE 101 (H) 11/03/2021 1636   GLUCOSE 97 07/18/2020 0403   BUN 16 11/03/2021 1636   CREATININE 1.02 (H) 11/03/2021 1636   CALCIUM 10.3 11/03/2021 1636    PROT 6.9 07/14/2021 1511   ALBUMIN 3.8 07/06/2021 0000   ALBUMIN 4.2 05/12/2021  1256   AST 18 07/06/2021 0000   ALT 9 07/06/2021 0000   ALKPHOS 64 07/06/2021 0000   BILITOT 1.6 (H) 05/12/2021 1256   GFRNONAA >60 07/18/2020 0403   GFRAA 83 04/13/2020 0929   Lab Results  Component Value Date   CHOL 146 05/12/2021   HDL 77 05/12/2021   LDLCALC 55 05/12/2021   TRIG 74 05/12/2021   CHOLHDL 1.9 05/12/2021   Lab Results  Component Value Date   HGBA1C 5.6 11/17/2020   Lab Results  Component Value Date   VITAMINB12 622 11/03/2021   Lab Results  Component Value Date   TSH 1.110 07/14/2021    Margie Ege, AGNP-C, DNP 07/19/2022, 5:43 AM [facility] [address], [address] [place], [place] [address] [phone]

## 2022-12-22 ENCOUNTER — Telehealth: Payer: Self-pay | Admitting: Neurology

## 2022-12-22 NOTE — Telephone Encounter (Signed)
Pt said received a no show letter on 07/19/22. Want to make [facility] aware had moved in February.

## 2023-02-01 ENCOUNTER — Ambulatory Visit: Payer: Medicare HMO
